# Patient Record
Sex: Male | Born: 1950 | Race: White | Hispanic: No | Marital: Married | Smoking: Never smoker
Health system: Southern US, Community
[De-identification: ages and names within clinical notes are randomized; demographics above are authoritative.]

## PROBLEM LIST (undated history)

## (undated) DIAGNOSIS — G4730 Sleep apnea, unspecified: Secondary | ICD-10-CM

## (undated) DIAGNOSIS — N289 Disorder of kidney and ureter, unspecified: Secondary | ICD-10-CM

## (undated) DIAGNOSIS — I10 Essential (primary) hypertension: Secondary | ICD-10-CM

## (undated) DIAGNOSIS — H269 Unspecified cataract: Secondary | ICD-10-CM

## (undated) DIAGNOSIS — M51369 Other intervertebral disc degeneration, lumbar region without mention of lumbar back pain or lower extremity pain: Secondary | ICD-10-CM

## (undated) DIAGNOSIS — I77811 Abdominal aortic ectasia: Secondary | ICD-10-CM

## (undated) DIAGNOSIS — F32A Depression, unspecified: Secondary | ICD-10-CM

## (undated) DIAGNOSIS — F329 Major depressive disorder, single episode, unspecified: Secondary | ICD-10-CM

## (undated) DIAGNOSIS — R55 Syncope and collapse: Secondary | ICD-10-CM

## (undated) DIAGNOSIS — M5136 Other intervertebral disc degeneration, lumbar region: Secondary | ICD-10-CM

## (undated) DIAGNOSIS — T7840XA Allergy, unspecified, initial encounter: Secondary | ICD-10-CM

## (undated) DIAGNOSIS — T8859XA Other complications of anesthesia, initial encounter: Secondary | ICD-10-CM

## (undated) DIAGNOSIS — E785 Hyperlipidemia, unspecified: Secondary | ICD-10-CM

## (undated) DIAGNOSIS — I4510 Unspecified right bundle-branch block: Secondary | ICD-10-CM

## (undated) HISTORY — DX: Major depressive disorder, single episode, unspecified: F32.9

## (undated) HISTORY — DX: Unspecified cataract: H26.9

## (undated) HISTORY — PX: EYE SURGERY: SHX253

## (undated) HISTORY — DX: Allergy, unspecified, initial encounter: T78.40XA

## (undated) HISTORY — DX: Depression, unspecified: F32.A

## (undated) HISTORY — DX: Syncope and collapse: R55

## (undated) HISTORY — DX: Disorder of kidney and ureter, unspecified: N28.9

## (undated) HISTORY — DX: Hyperlipidemia, unspecified: E78.5

## (undated) HISTORY — DX: Unspecified right bundle-branch block: I45.10

## (undated) HISTORY — DX: Abdominal aortic ectasia: I77.811

## (undated) HISTORY — PX: KNEE ARTHROSCOPY W/ MENISCAL REPAIR: SHX1877

## (undated) HISTORY — PX: JOINT REPLACEMENT: SHX530

## (undated) HISTORY — DX: Sleep apnea, unspecified: G47.30

## (undated) HISTORY — DX: Essential (primary) hypertension: I10

## (undated) HISTORY — PX: ROTATOR CUFF REPAIR: SHX139

## (undated) HISTORY — DX: Other intervertebral disc degeneration, lumbar region without mention of lumbar back pain or lower extremity pain: M51.369

## (undated) HISTORY — PX: WISDOM TOOTH EXTRACTION: SHX21

## (undated) HISTORY — DX: Other intervertebral disc degeneration, lumbar region: M51.36

## (undated) HISTORY — PX: CATARACT EXTRACTION, BILATERAL: SHX1313

---

## 2012-04-18 DIAGNOSIS — R519 Headache, unspecified: Secondary | ICD-10-CM | POA: Insufficient documentation

## 2013-08-10 DIAGNOSIS — E785 Hyperlipidemia, unspecified: Secondary | ICD-10-CM | POA: Insufficient documentation

## 2013-10-20 DIAGNOSIS — Z961 Presence of intraocular lens: Secondary | ICD-10-CM | POA: Insufficient documentation

## 2014-06-04 LAB — HM COLONOSCOPY

## 2015-08-17 DIAGNOSIS — I10 Essential (primary) hypertension: Secondary | ICD-10-CM | POA: Insufficient documentation

## 2016-08-20 DIAGNOSIS — R739 Hyperglycemia, unspecified: Secondary | ICD-10-CM | POA: Insufficient documentation

## 2017-05-21 ENCOUNTER — Ambulatory Visit (INDEPENDENT_AMBULATORY_CARE_PROVIDER_SITE_OTHER): Payer: BLUE CROSS/BLUE SHIELD | Admitting: Physician Assistant

## 2017-05-21 ENCOUNTER — Encounter: Payer: Self-pay | Admitting: Physician Assistant

## 2017-05-21 ENCOUNTER — Encounter (INDEPENDENT_AMBULATORY_CARE_PROVIDER_SITE_OTHER): Payer: Self-pay

## 2017-05-21 VITALS — BP 147/88 | HR 61 | Ht 71.0 in | Wt 245.0 lb

## 2017-05-21 DIAGNOSIS — M51379 Other intervertebral disc degeneration, lumbosacral region without mention of lumbar back pain or lower extremity pain: Secondary | ICD-10-CM | POA: Insufficient documentation

## 2017-05-21 DIAGNOSIS — M5137 Other intervertebral disc degeneration, lumbosacral region: Secondary | ICD-10-CM | POA: Diagnosis not present

## 2017-05-21 DIAGNOSIS — F3342 Major depressive disorder, recurrent, in full remission: Secondary | ICD-10-CM | POA: Insufficient documentation

## 2017-05-21 DIAGNOSIS — J309 Allergic rhinitis, unspecified: Secondary | ICD-10-CM | POA: Insufficient documentation

## 2017-05-21 DIAGNOSIS — F329 Major depressive disorder, single episode, unspecified: Secondary | ICD-10-CM | POA: Insufficient documentation

## 2017-05-21 DIAGNOSIS — M1712 Unilateral primary osteoarthritis, left knee: Secondary | ICD-10-CM | POA: Insufficient documentation

## 2017-05-21 DIAGNOSIS — I10 Essential (primary) hypertension: Secondary | ICD-10-CM

## 2017-05-21 DIAGNOSIS — Z6834 Body mass index (BMI) 34.0-34.9, adult: Secondary | ICD-10-CM

## 2017-05-21 DIAGNOSIS — S83209A Unspecified tear of unspecified meniscus, current injury, unspecified knee, initial encounter: Secondary | ICD-10-CM | POA: Insufficient documentation

## 2017-05-21 DIAGNOSIS — Z8249 Family history of ischemic heart disease and other diseases of the circulatory system: Secondary | ICD-10-CM | POA: Insufficient documentation

## 2017-05-21 DIAGNOSIS — R55 Syncope and collapse: Secondary | ICD-10-CM | POA: Insufficient documentation

## 2017-05-21 DIAGNOSIS — E6609 Other obesity due to excess calories: Secondary | ICD-10-CM | POA: Diagnosis not present

## 2017-05-21 DIAGNOSIS — F32A Depression, unspecified: Secondary | ICD-10-CM | POA: Insufficient documentation

## 2017-05-21 DIAGNOSIS — Z7689 Persons encountering health services in other specified circumstances: Secondary | ICD-10-CM

## 2017-05-21 LAB — HEPATITIS C ANTIBODY

## 2017-05-21 MED ORDER — FLUTICASONE PROPIONATE 50 MCG/ACT NA SUSP: 2.0000 | Freq: Every day | NASAL | 6 refills | Status: DC

## 2017-05-21 NOTE — Patient Instructions (Addendum)
Thank you for coming in today!  Schedule your annual physical exam in August   Physical Activity Recommendations for modifying lipids and lowering blood pressure Engage in aerobic physical activity to reduce LDL-cholesterol, non-HDL-cholesterol, and blood pressure  Frequency: 3-4 sessions per week  Intensity: moderate to vigorous  Duration: 40 minutes on average  Physical Activity Recommendations for secondary prevention 1. Aerobic exercise  Frequency: 3-5 sessions per week  Intensity: 50-80% capacity  Duration: 20 - 60 minutes  Examples: walking, treadmill, cycling, rowing, stair climbing, and arm/leg ergometry  2. Resistance exercise  Frequency: 2-3 sessions per week  Intensity: 10-15 repetitions/set to moderate fatigue  Duration: 1-3 sets of 8-10 upper and lower body exercises  Examples: calisthenics, elastic bands, cuff/hand weights, dumbbels, free weights, wall pulleys, and weight machines  Heart-Healthy Lifestyle  Eating a diet rich in vegetables, fruits and whole grains: also includes low-fat dairy products, poultry, fish, legumes, and nuts; limit intake of sweets, sugar-sweetened beverages and red meats  Getting regular exercise  Maintaining a healthy weight  Limit alcohol to 2 drinks per day  Staying on top of your health; for some people, lifestyle changes alone may not be enough to prevent a heart attack or stroke. In these cases, taking a statin at the right dose will most likely be necessary

## 2017-05-21 NOTE — Progress Notes (Signed)
HPI:                                                                Lance Anderson is a 66 y.o. male who presents to [place]: Primary Care Sports Medicine today to establish care  HTN: taking Amlodipine 69m daily. Compliant with medications. Denies vision change, headache, chest pain with exertion, orthopnea, lightheadedness, syncope and edema. Risk factors include:  HLD: taking Pravastatin daily. Last LDL 103 (01/2017). Last ALT, AST 19, 22. Denies myalgias.   Depression: taking Fluoxetine daily without difficulty. Compliant with medication. Denies symptoms of depression/anhedonia. Denies symptoms of mania/hypomania. Denies suicidal thinking. Denies auditory/visual hallucinations.   Chronic LBP/DDD: takes Ibuprofen 8054mprn for flares. MRI Lspine 10/2014 shows mild disc desiccation and bulge and L3-L4, L4-L5. There is DDD and paracentral disc protrusion traversing the right S1 nerve root at L5-S1.   Health Maintenance Health Maintenance  Topic Date Due  . Hepatitis C Screening  0109/08/1951. HIV Screening  01/17/1968  . COLONOSCOPY  01/16/2003  . INFLUENZA VACCINE  07/23/2017  . TETANUS/TDAP  12/30/2022     Health Habits  Diet: fair  Exercise: cardio x 30 3-4 days/week   Past Medical History:  Diagnosis Date  . Allergy   . Cataract   . DDD (degenerative disc disease), lumbar   . Depression   . Hyperlipidemia   . Hypertension   . RBBB   . Vasovagal syncope    Past Surgical History:  Procedure Laterality Date  . CATARACT EXTRACTION, BILATERAL    . KNEE ARTHROSCOPY W/ MENISCAL REPAIR Left   . ROTATOR CUFF REPAIR Right   . WISDOM TOOTH EXTRACTION     Social History  Substance Use Topics  . Smoking status: Passive Smoke Exposure - Never Smoker  . Smokeless tobacco: Never Used  . Alcohol use 2.4 oz/week    4 Cans of beer per week   family history includes Heart attack in his brother and paternal grandfather; Heart disease in his father; Lung  cancer in his father.  ROS: negative except as noted in the HPI  Medications: Current Outpatient Prescriptions  Medication Sig Dispense Refill  . amLODipine (NORVASC) 5 MG tablet 1 tablet daily.    . Marland Kitchenspirin 81 MG EC tablet Take 81 mg by mouth daily. Swallow whole.    . Marland KitchenLUoxetine (PROZAC) 20 MG capsule 1 capsule daily.    . Marland Kitchenbuprofen (ADVIL,MOTRIN) 800 MG tablet Take 800 mg by mouth every 8 (eight) hours as needed.    . pravastatin (PRAVACHOL) 20 MG tablet 1 tablet daily.    . fluticasone (FLONASE) 50 MCG/ACT nasal spray Place 2 sprays into both nostrils daily. 16 g 6   No current facility-administered medications for this visit.    Allergies  Allergen Reactions  . Codeine Nausea Only  . Paroxetine     Makes spacey  . Penicillins Diarrhea       Objective:  BP (!) 147/88   Pulse 61   Ht 5' 11"  (1.803 m)   Wt 245 lb (111.1 kg)   BMI 34.17 kg/m  Gen: well-groomed, cooperative, not ill-appearing, no distress HEENT: normal conjunctiva, TM's clear, oropharynx clear, moist mucus membranes, no thyromegaly or tenderness Pulm: Normal work of breathing, normal phonation, clear to  auscultation bilaterally CV: Normal rate, regular rhythm, s1 and s2 distinct, no murmurs, clicks or rubs, no carotid bruit Neuro: alert and oriented x 3, EOM's intact, PERRLA, normal tone, no tremor MSK: moving all extremities, normal gait and station, no peripheral edema Skin: warm and dry, no rashes or lesions on exposed skin Psych: normal affect, euthymic mood, normal speech and thought content   No results found for this or any previous visit (from the past 72 hour(s)). No results found.  CMP 02/18/2017 Glucose 92 BUN 21 Scr 1.16 GFR 66 Na 139 K 4.5 Cl 103 CO2 23 Ca 8.8 Tot Prot 6.7 Alb 3.8 Globulin 2.9 T Bili 0.5 Alk Phos 75 AST 22 ALT 19  Lipid Panel 02/18/2017 TC 164 TG 67 HDL 48 VLDL 13 LDL 103  PSA 0.4  CBC 02/18/2017 WBC 7.9 RBC 4.61 Hgb 14.5 Hct 41.3 MCV 90 RDW  13.7 PLT 258  HgbA1C 5.3  Hep C Antibody <0.1  Depression screen PHQ 2/9 05/21/2017  Decreased Interest 0  Down, Depressed, Hopeless 0  PHQ - 2 Score 0    Assessment and Plan: 66 y.o. male with   1. Encounter to establish care - reviewed patient's labs from 01/2017 - reviewed immunizations - reviewed PMH and PSH - Colonoscopy UTD 06/04/2014 requesting outside records  2. Essential hypertension with goal blood pressure less than 140/90 - cont Amlodipine daily - limit salt. DASH eating plan - cont regular cardiovascular exercise, 4 sessions per week  3. Allergic rhinitis, unspecified seasonality, unspecified trigger - fluticasone (FLONASE) 50 MCG/ACT nasal spray; Place 2 sprays into both nostrils daily.  Dispense: 16 g; Refill: 6  4. Class 1 obesity due to excess calories with serious comorbidity and body mass index (BMI) of 34.0 to 34.9 in adult - counseled on heart-healthy lifestyle to include limiting alcohol, regular exercise and healthy diet - patient exercises regularly, but has some limitations due to back and knee pain   Patient education and anticipatory guidance given Patient agrees with treatment plan Follow-up in 3 months for CPE or sooner as needed  Darlyne Russian PA-C

## 2017-05-23 ENCOUNTER — Encounter: Payer: Self-pay | Admitting: Physician Assistant

## 2017-08-04 ENCOUNTER — Encounter: Payer: Self-pay | Admitting: Physician Assistant

## 2017-08-04 ENCOUNTER — Ambulatory Visit (INDEPENDENT_AMBULATORY_CARE_PROVIDER_SITE_OTHER): Payer: BLUE CROSS/BLUE SHIELD | Admitting: Physician Assistant

## 2017-08-04 VITALS — BP 130/82 | HR 54 | Resp 14 | Ht 71.0 in | Wt 244.0 lb

## 2017-08-04 DIAGNOSIS — E785 Hyperlipidemia, unspecified: Secondary | ICD-10-CM

## 2017-08-04 DIAGNOSIS — Z0000 Encounter for general adult medical examination without abnormal findings: Secondary | ICD-10-CM | POA: Diagnosis not present

## 2017-08-04 DIAGNOSIS — Z5181 Encounter for therapeutic drug level monitoring: Secondary | ICD-10-CM

## 2017-08-04 DIAGNOSIS — L299 Pruritus, unspecified: Secondary | ICD-10-CM | POA: Diagnosis not present

## 2017-08-04 DIAGNOSIS — I10 Essential (primary) hypertension: Secondary | ICD-10-CM | POA: Diagnosis not present

## 2017-08-04 DIAGNOSIS — Z79899 Other long term (current) drug therapy: Secondary | ICD-10-CM | POA: Diagnosis not present

## 2017-08-04 DIAGNOSIS — F3342 Major depressive disorder, recurrent, in full remission: Secondary | ICD-10-CM | POA: Diagnosis not present

## 2017-08-04 LAB — LIPID PANEL W/REFLEX DIRECT LDL
CHOL/HDL RATIO: 3.7 ratio (ref <5.0)
CHOLESTEROL: 182 mg/dL (ref <200)
HDL: 49 mg/dL (ref >40)
LDL-CHOLESTEROL: 118 mg/dL — AB
NON-HDL CHOLESTEROL (CALC): 133 mg/dL — AB (ref <130)
TRIGLYCERIDES: 56 mg/dL (ref <150)

## 2017-08-04 LAB — COMPREHENSIVE METABOLIC PANEL
ALBUMIN: 3.8 g/dL (ref 3.6–5.1)
ALT: 16 U/L (ref 9–46)
AST: 20 U/L (ref 10–35)
Alkaline Phosphatase: 68 U/L (ref 40–115)
BUN: 22 mg/dL (ref 7–25)
CHLORIDE: 107 mmol/L (ref 98–110)
CO2: 25 mmol/L (ref 20–32)
Calcium: 8.8 mg/dL (ref 8.6–10.3)
Creat: 1.13 mg/dL (ref 0.70–1.25)
Glucose, Bld: 96 mg/dL (ref 65–99)
Potassium: 4.4 mmol/L (ref 3.5–5.3)
SODIUM: 139 mmol/L (ref 135–146)
TOTAL PROTEIN: 6.8 g/dL (ref 6.1–8.1)
Total Bilirubin: 0.5 mg/dL (ref 0.2–1.2)

## 2017-08-04 MED ORDER — PRAVASTATIN SODIUM 20 MG PO TABS: 20.0000 mg | ORAL_TABLET | Freq: Every day | ORAL | 3 refills | Status: DC

## 2017-08-04 MED ORDER — HYDROXYZINE HCL 25 MG PO TABS: 25.0000 mg | ORAL_TABLET | Freq: Three times a day (TID) | ORAL | 3 refills | Status: DC | PRN

## 2017-08-04 MED ORDER — FLUOXETINE HCL 20 MG PO CAPS: 20.0000 mg | ORAL_CAPSULE | Freq: Every day | ORAL | 1 refills | Status: DC

## 2017-08-04 MED ORDER — AMLODIPINE BESYLATE 5 MG PO TABS: 5.0000 mg | ORAL_TABLET | Freq: Every day | ORAL | 1 refills | Status: DC

## 2017-08-04 NOTE — Progress Notes (Signed)
HPI:                                                                Lance Anderson is a 66 y.o. male who presents to [facility]: [facility] today for annual physical exam  Current Concerns include itching  Patient reports pruritus triggered by sun exposure. Reports he is currently itchy across his chest wall. Denies urticaria or rash.   Health Maintenance Health Maintenance  Topic Date Due  . HIV Screening  01/17/1966  . INFLUENZA VACCINE  07/23/2017  . TETANUS/TDAP  12/30/2022  . COLONOSCOPY  06/04/2024  . Hepatitis C Screening  Completed    Past Medical History:  Diagnosis Date  . Allergy   . Cataract   . DDD (degenerative disc disease), lumbar   . Depression   . Hyperlipidemia   . Hypertension   . RBBB   . Vasovagal syncope    Past Surgical History:  Procedure Laterality Date  . CATARACT EXTRACTION, BILATERAL    . KNEE ARTHROSCOPY W/ MENISCAL REPAIR Left   . ROTATOR CUFF REPAIR Right   . WISDOM TOOTH EXTRACTION     Social History  Substance Use Topics  . Smoking status: Passive Smoke Exposure - Never Smoker  . Smokeless tobacco: Never Used  . Alcohol use 2.4 oz/week    4 Cans of beer per week   family history includes Heart attack in his brother and paternal grandfather; Heart disease in his father; Lung cancer in his father.  ROS: Review of Systems  Constitutional: Negative.   HENT: Negative.   Eyes: Positive for blurred vision (wears reading glasses).  Respiratory: Negative.   Cardiovascular: Negative.   Gastrointestinal: Negative.   Genitourinary: Negative.   Musculoskeletal: Negative.   Skin: Positive for itching. Negative for rash.  Neurological: Negative.   Endo/Heme/Allergies: Positive for environmental allergies.  Psychiatric/Behavioral: Negative.      Medications: Current Outpatient Prescriptions  Medication Sig Dispense Refill  . amLODipine (NORVASC) 5 MG tablet 1 tablet daily.    Marland Kitchen aspirin 81 MG EC  tablet Take 81 mg by mouth daily. Swallow whole.    Marland Kitchen FLUoxetine (PROZAC) 20 MG capsule 1 capsule daily.    . fluticasone (FLONASE) 50 MCG/ACT nasal spray Place 2 sprays into both nostrils daily. 16 g 6  . ibuprofen (ADVIL,MOTRIN) 800 MG tablet Take 800 mg by mouth every 8 (eight) hours as needed.    . pravastatin (PRAVACHOL) 20 MG tablet 1 tablet daily.     No current facility-administered medications for this visit.    Allergies  Allergen Reactions  . Codeine Nausea Only  . Paroxetine     Makes spacey  . Penicillins Diarrhea       Objective:  BP 130/82   Pulse (!) 54   Ht 5\' 11"  (1.803 m)   Wt 244 lb (110.7 kg)   BMI 34.03 kg/m  General Appearance:  Alert, cooperative, no distress, appropriate for age                            Head:  Normocephalic, without obvious abnormality  Eyes:  PERRLA, no nystagmus, EOM's intact, conjunctiva and cornea clear,                              Ears:  TM pearly gray color and semitransparent, external ear canals normal, both ears                            Nose:  Nares symmetrical                          Throat:  Lips, tongue, and mucosa are moist, pink, and intact; good dentition                             Neck:  Supple; symmetrical, trachea midline, no adenopathy; thyroid: no enlargement, symmetric, no tenderness/mass/nodules                             Back:  Symmetrical, no curvature, ROM normal               Chest:  Atraumatic, symmetric, no tenderness, no rashes                           Lungs:  Clear to auscultation bilaterally, respirations unlabored                             Heart:  bradycardic rate & normal rhythm, S1 and S2 normal, no murmurs, rubs, or gallops, no carotid bruit                     Abdomen:  Soft, non-tender, no mass or organomegaly              Genitourinary:  deferred         Musculoskeletal:  Tone and strength strong and symmetrical, all extremities; no joint pain or edema, normal  gait and station                                    Lymphatic:  No cervical or supraclavicular adenopathy             Skin/Hair/Nails:  Skin warm, dry and intact, no rashes or abnormal dyspigmentation                   Neurologic:  Alert and oriented x3, no cranial nerve deficits, sensation grossly intact, normal tone, no tremor    No results found for this or any previous visit (from the past 72 hour(s)). No results found.  [facility] 08/04/2017 05/21/2017  Decreased Interest 0 0  Down, Depressed, Hopeless 0 0  PHQ - 2 Score 0 0     Assessment and Plan: 66 y.o. male with   1. Encounter for annual physical exam - negative PHQ2 - reviewed health maintenance - colonscopy UTD - tetanus UTD - hepatitis c screening complete  2. Dyslipidemia - pravastatin (PRAVACHOL) 20 MG tablet; Take 1 tablet (20 mg total) by mouth daily.  Dispense: 90 tablet; Refill: 3  3. Recurrent major depressive disorder, in full remission (HCC) - FLUoxetine (PROZAC) 20 MG capsule; Take 1  capsule (20 mg total) by mouth daily.  Dispense: 90 capsule; Refill: 1  4. Essential hypertension with goal blood pressure less than 140/90 - amLODipine (NORVASC) 5 MG tablet; Take 1 tablet (5 mg total) by mouth daily.  Dispense: 90 tablet; Refill: 1 - Comprehensive metabolic panel  5. Encounter for monitoring statin therapy - Lipid Panel w/reflex Direct LDL  6. Pruritus - generalized without primary skin lesions - Hydroxyzine prn - patient is not taking any medications known to cause photosensitivity or pruritis. He has no rash. No systemic or constitutional symptoms - checking liver function today with CMP - if symptoms do not respond to antihistamine, will proceed with further workup to include CBC w/diff, TSH, HIV, and CXR  No orders of the defined types were placed in this encounter.  Patient education and anticipatory guidance given Patient agrees with treatment plan Follow-up in 6 months for  HTN or sooner as needed  Levonne Hubert PA-C

## 2017-08-04 NOTE — Patient Instructions (Signed)

## 2017-08-05 MED ORDER — PRAVASTATIN SODIUM 40 MG PO TABS: 40.0000 mg | ORAL_TABLET | Freq: Every day | ORAL | 3 refills | Status: DC

## 2017-08-05 NOTE — Progress Notes (Signed)
Hi Lance Anderson,  Your labs look good.  Your LDL cholesterol ideally should be 100 or less for preventing plaques/blockages. I recommend increasing your pravastatin to 40mg  (2 tabs) daily. I can write a new prescription for you. Continue to work on limiting saturated fats and regular cardiovascular exercise.  Best, Vinetta Bergamoharley

## 2017-08-05 NOTE — Addendum Note (Signed)
Addended by: Gena FrayUMMINGS, CHARLEY E on: 08/05/2017 05:25 PM   Modules accepted: Orders

## 2017-10-26 ENCOUNTER — Encounter: Payer: Self-pay | Admitting: Emergency Medicine

## 2017-10-26 ENCOUNTER — Emergency Department (INDEPENDENT_AMBULATORY_CARE_PROVIDER_SITE_OTHER)
Admission: EM | Admit: 2017-10-26 | Discharge: 2017-10-26 | Disposition: A | Discharge status: Home / Self Care | Payer: BLUE CROSS/BLUE SHIELD | Source: Home / Self Care | Attending: Family Medicine | Admitting: Family Medicine

## 2017-10-26 DIAGNOSIS — J069 Acute upper respiratory infection, unspecified: Secondary | ICD-10-CM | POA: Diagnosis not present

## 2017-10-26 DIAGNOSIS — R05 Cough: Secondary | ICD-10-CM

## 2017-10-26 DIAGNOSIS — R053 Chronic cough: Secondary | ICD-10-CM

## 2017-10-26 MED ORDER — BENZONATATE 100 MG PO CAPS: 100.0000 mg | ORAL_CAPSULE | Freq: Three times a day (TID) | ORAL | 0 refills | Status: DC

## 2017-10-26 MED ORDER — AZITHROMYCIN 250 MG PO TABS: 250.0000 mg | ORAL_TABLET | Freq: Every day | ORAL | 0 refills | Status: DC

## 2017-10-26 NOTE — ED Triage Notes (Signed)
[facility]/O cough times 3 weeks, productive over the last week with greening sputum. Denies fever, C/O headache, and sinus pressure, and nasal drainage.

## 2017-10-26 NOTE — ED Provider Notes (Signed)
[name]    CSN: [id number] Arrival date & time: 10/26/17  1302     History   Chief Complaint Chief Complaint  Patient presents with  . Cough  . Headache    HPI Lance Anderson is a 66 y.o. male.   HPI  Lance Anderson is a 66 y.o. male presenting to UC with c/o 3-4 weeks of gradually worsening productive cough with associated nasal congestion, frontal headache and sinus pressure. Over the last 1 week he has developed green sputum.  His wife has been sick but he states she typically gets a sinus infection 3-4 times a year while he has not been sick for several years.  Denies fever, chills, n/v/d. No hx of asthma or COPD.   Past Medical History:  Diagnosis Date  . Allergy   . Cataract   . DDD (degenerative disc disease), lumbar   . Depression   . Hyperlipidemia   . Hypertension   . RBBB   . Vasovagal syncope     Patient Active Problem List   Diagnosis Date Noted  . Pruritus 08/04/2017  . Encounter for monitoring statin therapy 08/04/2017  . Depression 05/21/2017  . Family history of heart disease in male family member before age 86 05/21/2017  . Tear of meniscus of knee 05/21/2017  . Class 1 obesity due to excess calories with serious comorbidity and body mass index (BMI) of 34.0 to 34.9 in adult 05/21/2017  . Allergic rhinitis 05/21/2017  . DDD (degenerative disc disease), lumbosacral 05/21/2017  . Vasovagal episode 05/21/2017  . Essential hypertension with goal blood pressure less than 140/90 08/17/2015  . Pseudophakia of both eyes 10/20/2013  . Dyslipidemia 08/10/2013    Past Surgical History:  Procedure Laterality Date  . CATARACT EXTRACTION, BILATERAL    . KNEE ARTHROSCOPY W/ MENISCAL REPAIR Left   . ROTATOR CUFF REPAIR Right   . WISDOM TOOTH EXTRACTION         Home Medications    Prior to Admission medications   Medication Sig Start Date End Date Taking? Authorizing Provider  amLODipine (NORVASC) 5 MG tablet Take 1 tablet (5 mg total)  by mouth daily. 08/04/17   Carlis Stable, PA-C  aspirin 81 MG EC tablet Take 81 mg by mouth daily. Swallow whole.    [provider]  azithromycin (ZITHROMAX) 250 MG tablet Take 1 tablet (250 mg total) daily by mouth. Take first 2 tablets together, then 1 every day until finished. 10/26/17   Lurene Shadow, PA-C  benzonatate (TESSALON) 100 MG capsule Take 1-2 capsules (100-200 mg total) every 8 (eight) hours by mouth. 10/26/17   Lurene Shadow, PA-C  FLUoxetine (PROZAC) 20 MG capsule Take 1 capsule (20 mg total) by mouth daily. 08/04/17   Carlis Stable, PA-C  fluticasone [facility]) 50 MCG/ACT nasal spray Place 2 sprays into both nostrils daily. 05/21/17   Carlis Stable, PA-C  hydrOXYzine (ATARAX/VISTARIL) 25 MG tablet Take 1 tablet (25 mg total) by mouth 3 (three) times daily as needed. 08/04/17   Carlis Stable, PA-C  ibuprofen (ADVIL,MOTRIN) 800 MG tablet Take 800 mg by mouth every 8 (eight) hours as needed.    [provider]  pravastatin (PRAVACHOL) 40 MG tablet Take 1 tablet (40 mg total) by mouth daily. 08/05/17   Carlis Stable, PA-C    Family History Family History  Problem Relation Age of Onset  . Heart disease Father   . Lung cancer Father   . Heart attack  Brother   . Heart attack Paternal Grandfather     Social History Social History   Tobacco Use  . Smoking status: Passive Smoke Exposure - Never Smoker  . Smokeless tobacco: Never Used  Substance Use Topics  . Alcohol use: Yes    Alcohol/week: 2.4 oz    Types: 4 Cans of beer per week  . Drug use: No     Allergies   Codeine; Paroxetine; and Penicillins   Review of Systems Review of Systems  Constitutional: Negative for chills and fever.  HENT: Positive for congestion, postnasal drip, rhinorrhea, sinus pressure and sinus pain. Negative for ear pain, sore throat, trouble swallowing and voice change.   Respiratory: Positive for cough.  Negative for shortness of breath.   Cardiovascular: Negative for chest pain and palpitations.  Gastrointestinal: Negative for abdominal pain, diarrhea, nausea and vomiting.  Musculoskeletal: Negative for arthralgias, back pain and myalgias.  Skin: Negative for rash.     Physical Exam Triage Vital Signs ED Triage Vitals  Enc Vitals Group     BP 10/26/17 1332 (!) 152/83     Pulse Rate 10/26/17 1332 81     Resp 10/26/17 1332 18     Temp 10/26/17 1332 98.6 F (37 C)     Temp Source 10/26/17 1332 Oral     SpO2 10/26/17 1332 98 %     Weight 10/26/17 1334 240 lb (108.9 kg)     Height 10/26/17 1334 5\' 11"  (1.803 m)     Head Circumference --      Peak Flow --      Pain Score 10/26/17 1335 4     Pain Loc --      Pain Edu? --      Excl. in GC? --    No data found.  Updated Vital Signs BP (!) 152/83 (BP Location: Left Arm)   Pulse 81   Temp 98.6 F (37 C) (Oral)   Resp 18   Ht 5\' 11"  (1.803 m)   Wt 240 lb (108.9 kg)   SpO2 98%   BMI 33.47 kg/m   Visual Acuity Right Eye Distance:   Left Eye Distance:   Bilateral Distance:    Right Eye Near:   Left Eye Near:    Bilateral Near:     Physical Exam  Constitutional: He is oriented to person, place, and time. He appears well-developed and well-nourished.  Non-toxic appearance. He does not appear ill. No distress.  HENT:  Head: Normocephalic and atraumatic.  Right Ear: Tympanic membrane normal.  Left Ear: Tympanic membrane normal.  Nose: Mucosal edema present. Right sinus exhibits no maxillary sinus tenderness and no frontal sinus tenderness. Left sinus exhibits no maxillary sinus tenderness and no frontal sinus tenderness.  Mouth/Throat: Uvula is midline, oropharynx is clear and moist and mucous membranes are normal.  Eyes: EOM are normal.  Neck: Normal range of motion.  Cardiovascular: Normal rate and regular rhythm.  Pulmonary/Chest: Effort normal. No respiratory distress. He has wheezes (faint wheeze in Left upper lung  field, cleared with cough).  Musculoskeletal: Normal range of motion.  Neurological: He is alert and oriented to person, place, and time.  Skin: Skin is warm and dry.  Psychiatric: He has a normal mood and affect. His behavior is normal.  Nursing note and vitals reviewed.    UC Treatments / Results  Labs (all labs ordered are listed, but only abnormal results are displayed) Labs Reviewed - No data to display  EKG  EKG Interpretation  None       Radiology No results found.  Procedures Procedures (including critical care time)  Medications Ordered in UC Medications - No data to display   Initial Impression / Assessment and Plan / UC Course  I have reviewed the triage vital signs and the nursing notes.  Pertinent labs & imaging results that were available during my care of the patient were reviewed by me and considered in my medical decision making (see chart for details).     Hx and exam c/w URI and sinusitis. Due to persistent cough, will cover for bacterial cause Pt allergic to PCN, will start on Azithromycin Encouraged fluids and rest F/u with PCP in 1 week if not improving.   Final Clinical Impressions(s) / UC Diagnoses   Final diagnoses:  Upper respiratory tract infection, unspecified type  Persistent cough for 3 weeks or longer    New Prescriptions This SmartLink is deprecated. Use AVSMEDLIST instead to display the medication list for a patient.   Controlled Substance Prescriptions [place] Controlled Substance Registry consulted? Not Applicable   Rolla Platehelps, Tonishia Steffy O, PA-C 10/26/17 1421

## 2017-12-31 ENCOUNTER — Other Ambulatory Visit: Payer: Self-pay

## 2017-12-31 ENCOUNTER — Emergency Department (INDEPENDENT_AMBULATORY_CARE_PROVIDER_SITE_OTHER): Payer: Medicare Other

## 2017-12-31 ENCOUNTER — Encounter: Payer: Self-pay | Admitting: *Deleted

## 2017-12-31 ENCOUNTER — Emergency Department (INDEPENDENT_AMBULATORY_CARE_PROVIDER_SITE_OTHER)
Admission: EM | Admit: 2017-12-31 | Discharge: 2017-12-31 | Disposition: A | Discharge status: Home / Self Care | Payer: Medicare Other | Source: Home / Self Care | Attending: Family Medicine | Admitting: Family Medicine

## 2017-12-31 DIAGNOSIS — M25512 Pain in left shoulder: Secondary | ICD-10-CM

## 2017-12-31 DIAGNOSIS — M19012 Primary osteoarthritis, left shoulder: Secondary | ICD-10-CM | POA: Diagnosis not present

## 2017-12-31 DIAGNOSIS — S4992XA Unspecified injury of left shoulder and upper arm, initial encounter: Secondary | ICD-10-CM | POA: Diagnosis not present

## 2017-12-31 NOTE — ED Triage Notes (Signed)
Pt c/o LT shoulder pain and arm weakness post fall on ice on 12/01/17.

## 2017-12-31 NOTE — ED Provider Notes (Signed)
[name]    CSN: [id number] Arrival date & time: 12/31/17  1103     History   Chief Complaint Chief Complaint  Patient presents with  . Shoulder Pain    HPI Lance Anderson is a 67 y.o. male.   HPI  Lance Anderson is a 67 y.o. male presenting to UC with c/o gradually worsening Left shoulder soreness and mild weakness since falling on an ice patch on 12/01/17. At the time, he only had Right knee pain as he landed on the right side of his body.  As knee pain started to subside, he developed Left shoulder soreness. No other injuries or heavy lifting that he can recall.  He did have surgery on his Right shoulder in 1990s and has also been advised his shoulder has a mild deformity due to years of overhead lifting and reaching with work.  Denies numbness in Left arm.  He is Right hand dominant.    Past Medical History:  Diagnosis Date  . Allergy   . Cataract   . DDD (degenerative disc disease), lumbar   . Depression   . Hyperlipidemia   . Hypertension   . RBBB   . Vasovagal syncope     Patient Active Problem List   Diagnosis Date Noted  . Pruritus 08/04/2017  . Encounter for monitoring statin therapy 08/04/2017  . Depression 05/21/2017  . Family history of heart disease in male family member before age 72 05/21/2017  . Tear of meniscus of knee 05/21/2017  . Class 1 obesity due to excess calories with serious comorbidity and body mass index (BMI) of 34.0 to 34.9 in adult 05/21/2017  . Allergic rhinitis 05/21/2017  . DDD (degenerative disc disease), lumbosacral 05/21/2017  . Vasovagal episode 05/21/2017  . Essential hypertension with goal blood pressure less than 140/90 08/17/2015  . Pseudophakia of both eyes 10/20/2013  . Dyslipidemia 08/10/2013    Past Surgical History:  Procedure Laterality Date  . CATARACT EXTRACTION, BILATERAL    . KNEE ARTHROSCOPY W/ MENISCAL REPAIR Left   . ROTATOR CUFF REPAIR Right   . WISDOM TOOTH EXTRACTION         Home  Medications    Prior to Admission medications   Medication Sig Start Date End Date Taking? Authorizing Provider  aspirin 81 MG EC tablet Take 81 mg by mouth daily. Swallow whole.   Yes [provider]  FLUoxetine (PROZAC) 20 MG capsule Take 1 capsule (20 mg total) by mouth daily. 08/04/17  Yes Carlis Stable, PA-C  amLODipine (NORVASC) 5 MG tablet Take 1 tablet (5 mg total) by mouth daily. 08/04/17   Carlis Stable, PA-C  fluticasone [facility]) 50 MCG/ACT nasal spray Place 2 sprays into both nostrils daily. 05/21/17   Carlis Stable, PA-C  hydrOXYzine (ATARAX/VISTARIL) 25 MG tablet Take 1 tablet (25 mg total) by mouth 3 (three) times daily as needed. 08/04/17   Carlis Stable, PA-C  ibuprofen (ADVIL,MOTRIN) 800 MG tablet Take 800 mg by mouth every 8 (eight) hours as needed.    [provider]  pravastatin (PRAVACHOL) 40 MG tablet Take 1 tablet (40 mg total) by mouth daily. 08/05/17   Carlis Stable, PA-C    Family History Family History  Problem Relation Age of Onset  . Heart disease Father   . Lung cancer Father   . Heart attack Brother   . Heart attack Paternal Grandfather     Social History Social History   Tobacco Use  . Smoking  status: Passive Smoke Exposure - Never Smoker  . Smokeless tobacco: Never Used  Substance Use Topics  . Alcohol use: Yes    Alcohol/week: 2.4 oz    Types: 4 Cans of beer per week  . Drug use: No     Allergies   Codeine; Paroxetine; and Penicillins   Review of Systems Review of Systems  Musculoskeletal: Positive for arthralgias and myalgias. Negative for joint swelling.  Skin: Negative for color change and rash.  Neurological: Positive for weakness. Negative for numbness.     Physical Exam Triage Vital Signs ED Triage Vitals  Enc Vitals Group     BP 12/31/17 1123 126/76     Pulse Rate 12/31/17 1123 63     Resp 12/31/17 1123 16     Temp 12/31/17 1123 98.1 F  (36.7 C)     Temp Source 12/31/17 1123 Oral     SpO2 12/31/17 1123 97 %     Weight 12/31/17 1123 243 lb (110.2 kg)     Height 12/31/17 1123 5\' 11"  (1.803 m)     Head Circumference --      Peak Flow --      Pain Score 12/31/17 1125 2     Pain Loc --      Pain Edu? --      Excl. in GC? --    No data found.  Updated Vital Signs BP 126/76 (BP Location: Right Arm)   Pulse 63   Temp 98.1 F (36.7 C) (Oral)   Resp 16   Ht 5\' 11"  (1.803 m)   Wt 243 lb (110.2 kg)   SpO2 97%   BMI 33.89 kg/m   Visual Acuity Right Eye Distance:   Left Eye Distance:   Bilateral Distance:    Right Eye Near:   Left Eye Near:    Bilateral Near:     Physical Exam  Constitutional: He is oriented to person, place, and time. He appears well-developed and well-nourished. No distress.  HENT:  Head: Normocephalic and atraumatic.  Mouth/Throat: Oropharynx is clear and moist.  Eyes: EOM are normal.  Neck: Normal range of motion.  Cardiovascular: Normal rate.  Pulses:      Radial pulses are 2+ on the right side, and 2+ on the left side.  Pulmonary/Chest: Effort normal.  Musculoskeletal: Normal range of motion. He exhibits tenderness. He exhibits no edema.  Left shoulder: no obvious deformity. Full ROM. Mild tenderness of proximal deltoid.  Full ROM elbow. 5/5 grip strength.   Neurological: He is alert and oriented to person, place, and time.  Skin: Skin is warm and dry. Capillary refill takes less than 2 seconds. No rash noted. He is not diaphoretic. No erythema.  Psychiatric: He has a normal mood and affect. His behavior is normal.  Nursing note and vitals reviewed.    UC Treatments / Results  Labs (all labs ordered are listed, but only abnormal results are displayed) Labs Reviewed - No data to display  EKG  EKG Interpretation None       Radiology Dg Shoulder Left  Result Date: 12/31/2017 CLINICAL DATA:  Left shoulder pain.  No injury. EXAM: LEFT SHOULDER - 2+ VIEW COMPARISON:  None.  FINDINGS: Mild degenerative changes in the left [facility] and glenohumeral joints with joint space narrowing and spurring. No acute bony abnormality. Specifically, no fracture, subluxation, or dislocation. IMPRESSION: Mild degenerative changes.  No acute bony abnormality. Electronically Signed   By: Charlett NoseKevin  Dover M.D.   On: 12/31/2017 11:39  Procedures Procedures (including critical care time)  Medications Ordered in UC Medications - No data to display   Initial Impression / Assessment and Plan / UC Course  I have reviewed the triage vital signs and the nursing notes.  Pertinent labs & imaging results that were available during my care of the patient were reviewed by me and considered in my medical decision making (see chart for details).     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    Final Clinical Impressions(s) / UC Diagnoses   Final diagnoses:  Left shoulder pain  Injury of left shoulder and upper arm, initial encounter    ED Discharge Orders    None       Controlled Substance Prescriptions [place] Controlled Substance Registry consulted? Not Applicable   Rolla Plate 12/31/17 1314

## 2018-01-02 ENCOUNTER — Ambulatory Visit (INDEPENDENT_AMBULATORY_CARE_PROVIDER_SITE_OTHER): Payer: Medicare Other | Admitting: Family Medicine

## 2018-01-02 ENCOUNTER — Encounter: Payer: Self-pay | Admitting: Family Medicine

## 2018-01-02 VITALS — BP 145/80 | HR 68 | Ht 71.0 in | Wt 246.0 lb

## 2018-01-02 DIAGNOSIS — M25512 Pain in left shoulder: Secondary | ICD-10-CM | POA: Diagnosis not present

## 2018-01-02 NOTE — Patient Instructions (Signed)
Thank you for coming in today. Do the exercises we discussed.  Recheck if not better ina few weeks.  We can start PT with a phone call.    Shoulder Impingement Syndrome Rehab Ask your health care provider which exercises are safe for you. Do exercises exactly as told by your health care provider and adjust them as directed. It is normal to feel mild stretching, pulling, tightness, or discomfort as you do these exercises, but you should stop right away if you feel sudden pain or your pain gets worse.Do not begin these exercises until told by your health care provider. Stretching and range of motion exercise This exercise warms up your muscles and joints and improves the movement and flexibility of your shoulder. This exercise also helps to relieve pain and stiffness. Exercise A: Passive horizontal adduction  1. Sit or stand and pull your left / right elbow across your chest, toward your other shoulder. Stop when you feel a gentle stretch in the back of your shoulder and upper arm. ? Keep your arm at shoulder height. ? Keep your arm as close to your body as you comfortably can. 2. Hold for __________ seconds. 3. Slowly return to the starting position. Repeat __________ times. Complete this exercise __________ times a day. Strengthening exercises These exercises build strength and endurance in your shoulder. Endurance is the ability to use your muscles for a long time, even after they get tired. Exercise B: External rotation, isometric 1. Stand or sit in a doorway, facing the door frame. 2. Bend your left / right elbow and place the back of your wrist against the door frame. Only your wrist should be touching the frame. Keep your upper arm at your side. 3. Gently press your wrist against the door frame, as if you are trying to push your arm away from your abdomen. ? Avoid shrugging your shoulder while you press your hand against the door frame. Keep your shoulder blade tucked down toward the  middle of your back. 4. Hold for __________ seconds. 5. Slowly release the tension, and relax your muscles completely before you do the exercise again. Repeat __________ times. Complete this exercise __________ times a day. Exercise C: Internal rotation, isometric  1. Stand or sit in a doorway, facing the door frame. 2. Bend your left / right elbow and place the inside of your wrist against the door frame. Only your wrist should be touching the frame. Keep your upper arm at your side. 3. Gently press your wrist against the door frame, as if you are trying to push your arm toward your abdomen. ? Avoid shrugging your shoulder while you press your hand against the door frame. Keep your shoulder blade tucked down toward the middle of your back. 4. Hold for __________ seconds. 5. Slowly release the tension, and relax your muscles completely before you do the exercise again. Repeat __________ times. Complete this exercise __________ times a day. Exercise D: Scapular protraction, supine  1. Lie on your back on a firm surface. Hold a __________ weight in your left / right hand. 2. Raise your left / right arm straight into the air so your hand is directly above your shoulder joint. 3. Push the weight into the air so your shoulder lifts off of the surface that you are lying on. Do not move your head, neck, or back. 4. Hold for __________ seconds. 5. Slowly return to the starting position. Let your muscles relax completely before you repeat this exercise. Repeat __________  times. Complete this exercise __________ times a day. Exercise E: Scapular retraction  1. Sit in a stable chair without armrests, or stand. 2. Secure an exercise band to a stable object in front of you so the band is at shoulder height. 3. Hold one end of the exercise band in each hand. Your palms should face down. 4. Squeeze your shoulder blades together and move your elbows slightly behind you. Do not shrug your shoulders while  you do this. 5. Hold for __________ seconds. 6. Slowly return to the starting position. Repeat __________ times. Complete this exercise __________ times a day. Exercise F: Shoulder extension  1. Sit in a stable chair without armrests, or stand. 2. Secure an exercise band to a stable object in front of you where the band is above shoulder height. 3. Hold one end of the exercise band in each hand. 4. Straighten your elbows and lift your hands up to shoulder height. 5. Squeeze your shoulder blades together and pull your hands down to the sides of your thighs. Stop when your hands are straight down by your sides. Do not let your hands go behind your body. 6. Hold for __________ seconds. 7. Slowly return to the starting position. Repeat __________ times. Complete this exercise __________ times a day. This information is not intended to replace advice given to you by your health care provider. Make sure you discuss any questions you have with your health care provider. Document Released: 12/09/2005 Document Revised: 08/15/2016 Document Reviewed: 11/11/2015 Elsevier Interactive Patient Education  [employer].

## 2018-01-02 NOTE — Progress Notes (Signed)
Lance BerkshireJohnny Lance Anderson is a 67 y.o. male who [facility] for left shoulder pain.  Mr. Lance Anderson has had left shoulder pain now for about a month.  This occurred when he fell on his right side.  He does not remember striking his shoulder and notes that the pain did not start until several hours after his fall.  He notes pain in the lateral upper arm worse with overhead motion and reaching back.  He denies any radiating pain weakness or numbness.  The pain is worse with activity and better with rest.  He is tried over-the-counter medications which have not helped.  He had an x-ray recently that showed only minimal degenerative changes of the glenohumeral joint.  He notes the pain is somewhat consistent with previous history of rotator cuff tendinitis.   Past Medical History:  Diagnosis Date  . Allergy   . Cataract   . DDD (degenerative disc disease), lumbar   . Depression   . Hyperlipidemia   . Hypertension   . RBBB   . Vasovagal syncope    Past Surgical History:  Procedure Laterality Date  . CATARACT EXTRACTION, BILATERAL    . KNEE ARTHROSCOPY W/ MENISCAL REPAIR Left   . ROTATOR CUFF REPAIR Right   . WISDOM TOOTH EXTRACTION     Social History   Tobacco Use  . Smoking status: Passive Smoke Exposure - Never Smoker  . Smokeless tobacco: Never Used  Substance Use Topics  . Alcohol use: Yes    Alcohol/week: 2.4 oz    Types: 4 Cans of beer per week     ROS:  As above   Medications: Current Outpatient Medications  Medication Sig Dispense Refill  . amLODipine (NORVASC) 5 MG tablet Take 1 tablet (5 mg total) by mouth daily. 90 tablet 1  . aspirin 81 MG EC tablet Take 81 mg by mouth daily. Swallow whole.    Marland Kitchen. FLUoxetine (PROZAC) 20 MG capsule Take 1 capsule (20 mg total) by mouth daily. 90 capsule 1  . fluticasone (FLONASE) 50 MCG/ACT nasal spray Place 2 sprays into both nostrils daily. 16 g 6  . hydrOXYzine (ATARAX/VISTARIL) 25  MG tablet Take 1 tablet (25 mg total) by mouth 3 (three) times daily as needed. 30 tablet 3  . ibuprofen (ADVIL,MOTRIN) 800 MG tablet Take 800 mg by mouth every 8 (eight) hours as needed.    . pravastatin (PRAVACHOL) 40 MG tablet Take 1 tablet (40 mg total) by mouth daily. 90 tablet 3   No current facility-administered medications for this visit.    Allergies  Allergen Reactions  . Codeine Nausea Only  . Paroxetine     Makes spacey  . Penicillins Diarrhea     Exam:  BP (!) 145/80   Pulse 68   Ht 5\' 11"  (1.803 m)   Wt 246 lb (111.6 kg)   BMI 34.31 kg/m  General: Well Developed, well nourished, and in no acute distress.  Neuro/Psych: Alert and oriented x3, extra-ocular muscles intact, able to move all 4 extremities, sensation grossly intact. Skin: Warm and dry, no rashes noted.  Respiratory: Not using accessory muscles, speaking in full sentences, trachea midline.  Cardiovascular: Pulses palpable, no extremity edema. Abdomen: Does not appear distended. MSK:  Shoulder: Normal-appearing Nontender. Range of motion is full but painful with abduction and internal rotation. Positive Hawkins and Neer's test. Negative empty can test. Negative Yergason's and speeds test. Strength is intact but painful with resisted external rotation and  abduction. Pulses capillary refill and sensation are intact throughout.  Procedure: Real-time Ultrasound Guided Injection of left subacromial bursa Device: GE Logiq E  Images permanently stored and available for review in the ultrasound unit. Verbal informed consent obtained. Discussed risks and benefits of procedure. Warned about infection bleeding damage to structures skin hypopigmentation and fat atrophy among others. Patient expresses understanding and agreement Time-out conducted.  Noted no overlying erythema, induration, or other signs of local infection.  Skin prepped in a sterile fashion.  Local anesthesia: Topical Ethyl chloride.   With sterile technique and under real time ultrasound guidance:40 mg of Kenalog and 2 mL of Marcaine injected easily.  Completed without difficulty  Pain immediately resolved suggesting accurate placement of the medication.  Advised to call if fevers/chills, erythema, induration, drainage, or persistent bleeding.  Images permanently stored and available for review in the ultrasound unit.  Impression: Technically successful ultrasound guided injection.      No results found for this or any previous visit (from the past 48 hour(s)). Dg Shoulder Left  Result Date: 12/31/2017 CLINICAL DATA:  Left shoulder pain.  No injury. EXAM: LEFT SHOULDER - 2+ VIEW COMPARISON:  None. FINDINGS: Mild degenerative changes in the left [facility] and glenohumeral joints with joint space narrowing and spurring. No acute bony abnormality. Specifically, no fracture, subluxation, or dislocation. IMPRESSION: Mild degenerative changes.  No acute bony abnormality. Electronically Signed   By: Charlett Nose M.D.   On: 12/31/2017 11:39      Assessment and Plan: 67 y.o. male with left shoulder pain very likely rotator cuff tendinitis versus subacromial bursitis/impingement.  His exam is positive for impingement signs and he had significant improvement following diagnostic and therapeutic injection today.  Plan for trial of home physical therapy as well as injection as noted above.  If not improving will refer to formal physical therapy and recheck in the near future if not better.    No orders of the defined types were placed in this encounter.  No orders of the defined types were placed in this encounter.   Discussed warning signs or symptoms. Please see discharge instructions. Patient expresses understanding.

## 2018-02-02 ENCOUNTER — Other Ambulatory Visit: Payer: Self-pay | Admitting: Physician Assistant

## 2018-02-02 DIAGNOSIS — F3342 Major depressive disorder, recurrent, in full remission: Secondary | ICD-10-CM

## 2018-02-02 DIAGNOSIS — I10 Essential (primary) hypertension: Secondary | ICD-10-CM

## 2018-02-04 ENCOUNTER — Ambulatory Visit: Payer: BLUE CROSS/BLUE SHIELD | Admitting: Physician Assistant

## 2018-02-25 ENCOUNTER — Ambulatory Visit (INDEPENDENT_AMBULATORY_CARE_PROVIDER_SITE_OTHER): Payer: Medicare Other | Admitting: Physician Assistant

## 2018-02-25 ENCOUNTER — Encounter: Payer: Self-pay | Admitting: Physician Assistant

## 2018-02-25 VITALS — BP 135/81 | HR 58 | Wt 243.0 lb

## 2018-02-25 DIAGNOSIS — Z79899 Other long term (current) drug therapy: Secondary | ICD-10-CM

## 2018-02-25 DIAGNOSIS — Z6834 Body mass index (BMI) 34.0-34.9, adult: Secondary | ICD-10-CM | POA: Diagnosis not present

## 2018-02-25 DIAGNOSIS — N529 Male erectile dysfunction, unspecified: Secondary | ICD-10-CM | POA: Diagnosis not present

## 2018-02-25 DIAGNOSIS — I10 Essential (primary) hypertension: Secondary | ICD-10-CM

## 2018-02-25 DIAGNOSIS — Z2821 Immunization not carried out because of patient refusal: Secondary | ICD-10-CM | POA: Diagnosis not present

## 2018-02-25 DIAGNOSIS — Z5181 Encounter for therapeutic drug level monitoring: Secondary | ICD-10-CM

## 2018-02-25 DIAGNOSIS — Z136 Encounter for screening for cardiovascular disorders: Secondary | ICD-10-CM

## 2018-02-25 DIAGNOSIS — E6609 Other obesity due to excess calories: Secondary | ICD-10-CM | POA: Diagnosis not present

## 2018-02-25 DIAGNOSIS — Z130 Encounter for screening for diseases of the blood and blood-forming organs and certain disorders involving the immune mechanism: Secondary | ICD-10-CM

## 2018-02-25 DIAGNOSIS — F3342 Major depressive disorder, recurrent, in full remission: Secondary | ICD-10-CM | POA: Diagnosis not present

## 2018-02-25 DIAGNOSIS — Z1389 Encounter for screening for other disorder: Secondary | ICD-10-CM | POA: Diagnosis not present

## 2018-02-25 DIAGNOSIS — E785 Hyperlipidemia, unspecified: Secondary | ICD-10-CM

## 2018-02-25 DIAGNOSIS — E66811 Obesity, class 1: Secondary | ICD-10-CM

## 2018-02-25 MED ORDER — AMLODIPINE BESYLATE 5 MG PO TABS: 5.0000 mg | ORAL_TABLET | Freq: Every day | ORAL | 1 refills | Status: DC

## 2018-02-25 MED ORDER — PRAVASTATIN SODIUM 40 MG PO TABS: 40.0000 mg | ORAL_TABLET | Freq: Every day | ORAL | 3 refills | Status: DC

## 2018-02-25 MED ORDER — ASPIRIN 81 MG PO TBEC: 81.0000 mg | DELAYED_RELEASE_TABLET | Freq: Every day | ORAL | 11 refills | Status: AC

## 2018-02-25 NOTE — Patient Instructions (Addendum)
For your blood pressure: - Goal <140/80 - Continue your Amlodipine daily - baby aspirin 81 mg daily to help prevent heart attack/stroke - monitor and log blood pressures at home - check around the same time each day in a relaxed setting - Limit salt to <2000 mg/day - Follow DASH eating plan - limit alcohol to 2 standard drinks per day for men and 1 per day for women - avoid tobacco products - weight loss: 7% of current body weight - follow-up every 6 months for your blood pressure    ED Treatment Vacuum erection devices (VED) have been commercially available for over 40 years. They consist of a closed-end plastic cylinder and a vacuum pump that is either hand or battery-operated. They are generally reliable, non-invasive, relatively easy to use, and inexpensive. They do require some degree of manual dexterity. The disadvantages are that they are not indiscreet being rather bulky, lead to a hinge-like erection (due to only getting the visible part of the penis erect), and can be painful in many patients.  Intracavernosal injections have also been available for 30 years. This treatment involves using a very small insulin-type needle to inject a vasoactive substance directly into the penile erectile tissues. This is a highly successful treatment (>70%) and is mainly used as the next option when patients fail oral medications.  The first injection is done by the provider where the patient or his partner is taught to do subsequent injections on their own. Complications include pain, fibrosis, and prolonged erections. Side effects are uncommon and this is a very successful option once we get the patient past the fear of penile injection, which in reality are no more painful than an injection in any other part of the body.

## 2018-02-25 NOTE — Progress Notes (Signed)
HPI:                                                                Lance Anderson is a 67 y.o. male who presents to [facility]: Primary Care Sports Medicine today for medication management  Depression/Anxiety: taking Fluoxetine for years without difficulty. He has some ED symptoms, but states he has had these symptoms most of his life, including the time before he was on an SSRI. He recently returned from spending the winter in [place] with his wife. Denies depressed mood or anhedonia. Denies symptoms of mania/hypomania. Denies suicidal thinking. Denies auditory/visual hallucinations.  HTN: taking Amlodipine 5 mg daily. Compliant with medications. Does not check BP's at home. Denies vision change, headache, chest pain with exertion, orthopnea, lightheadedness, syncope and edema. Risk factors include: HLD, male sex, age>55    Depression screen [facility] 2/9 02/25/2018 08/04/2017 05/21/2017  Decreased Interest 0 0 0  Down, Depressed, Hopeless 0 0 0  PHQ - 2 Score 0 0 0  Altered sleeping 1 - -  Tired, decreased energy 0 - -  Change in appetite 1 - -  Feeling bad or failure about yourself  0 - -  Trouble concentrating 0 - -  Moving slowly or fidgety/restless 0 - -  Suicidal thoughts 0 - -  PHQ-9 Score 2 - -    No flowsheet data found.    Past Medical History:  Diagnosis Date  . Allergy   . Cataract   . DDD (degenerative disc disease), lumbar   . Depression   . Hyperlipidemia   . Hypertension   . RBBB   . Vasovagal syncope    Past Surgical History:  Procedure Laterality Date  . CATARACT EXTRACTION, BILATERAL    . KNEE ARTHROSCOPY W/ MENISCAL REPAIR Left   . ROTATOR CUFF REPAIR Right   . WISDOM TOOTH EXTRACTION     Social History   Tobacco Use  . Smoking status: Passive Smoke Exposure - Never Smoker  . Smokeless tobacco: Never Used  Substance Use Topics  . Alcohol use: Yes    Alcohol/week: 2.4 oz    Types: 4 Cans of beer per week   family history  includes Heart attack in his brother and paternal grandfather; Heart disease in his father; Lung cancer in his father.    ROS: negative except as noted in the HPI  Medications: Current Outpatient Medications  Medication Sig Dispense Refill  . amLODipine (NORVASC) 5 MG tablet TAKE 1 TABLET DAILY 90 tablet 0  . aspirin 81 MG EC tablet Take 81 mg by mouth daily. Swallow whole.    Marland Kitchen FLUoxetine (PROZAC) 20 MG capsule TAKE 1 CAPSULE DAILY 90 capsule 0  . fluticasone (FLONASE) 50 MCG/ACT nasal spray Place 2 sprays into both nostrils daily. 16 g 6  . ibuprofen (ADVIL,MOTRIN) 800 MG tablet Take 800 mg by mouth every 8 (eight) hours as needed.    . pravastatin (PRAVACHOL) 40 MG tablet Take 1 tablet (40 mg total) by mouth daily. 90 tablet 3   No current facility-administered medications for this visit.    Allergies  Allergen Reactions  . Codeine Nausea Only  . Paroxetine     Makes spacey  . Penicillins Diarrhea       Objective:  BP (!) 142/78   Pulse (!) 54   Wt 243 lb (110.2 kg)   BMI 33.89 kg/m  Gen:  alert, not ill-appearing, no distress, appropriate for age, obese male HEENT: head normocephalic without obvious abnormality, conjunctiva and cornea clear, trachea midline Pulm: Normal work of breathing, normal phonation, clear to auscultation bilaterally, no wheezes, rales or rhonchi CV: bradycardic, regular rhythm, s1 and s2 distinct, no murmurs, clicks or rubs  GI: abdomen soft, nontender, no pulsatile masses, no abdominal bruit Neuro: alert and oriented x 3, no tremor MSK: extremities atraumatic, normal gait and station, no peripheral edema Skin: intact, no rashes on exposed skin, no jaundice, no cyanosis Psych: well-groomed, cooperative, good eye contact, euthymic mood, affect mood-congruent, speech is articulate, and thought processes clear and goal-directed    No results found for this or any previous visit (from the past 72 hour(s)). No results found.    Assessment and  Plan: 67 y.o. male with   1. Encounter for long-term (current) use of medications - CBC - Comprehensive metabolic panel - Lipid Panel w/reflex Direct LDL  2. Essential hypertension with goal blood pressure less than 140/90 BP Readings from Last 3 Encounters:  02/25/18 135/81  01/02/18 (!) 145/80  12/31/17 126/76  - BP in range. Continue Amlodipine and baby asa for primary prevention - CBC - Comprehensive metabolic panel - Urinalysis, Routine w reflex microscopic  3. Class 1 obesity due to excess calories with serious comorbidity and body mass index (BMI) of 34.0 to 34.9 in adult Wt Readings from Last 3 Encounters:  02/25/18 243 lb (110.2 kg)  01/02/18 246 lb (111.6 kg)  12/31/17 243 lb (110.2 kg)  - counseled on weight loss through decreased caloric intake and increased aerobic exercise   4. Refused influenza vaccine   5. Refused pneumococcal vaccination   6. Encounter for abdominal aortic aneurysm (AAA) screening - US ABDOMINAL AORTA SCREENING AAA; Future  7. Encounter for monitoring statin therapy - Lipid Panel w/reflex Direct LDL  8. Screening for blood disease - CBC - Comprehensive metabolic panel  9. Screening for blood or protein in urine - Urinalysis, Routine w reflex microscopic  10. Recurrent major depressive disorder, in full remission (HCC) - PHQ9=0 - continue Fluxoetine 20 mg daily  11. Dyslipidemia, goal LDL below 100 - Lipid Panel w/reflex Direct LDL   12. Erectile dysfunction - sildenafil caused headaches. Recommend VAD. If this fails, recommend intracavernosal injection  Patient education and anticipatory guidance given Patient agrees with treatment plan Follow-up in 3 months for welcome to medicare as needed if symptoms worsen or fail to improve  Levonne Hubertharley E. Pratik Dalziel PA-C

## 2018-02-26 LAB — COMPREHENSIVE METABOLIC PANEL
AG RATIO: 1.3 (calc) (ref 1.0–2.5)
ALKALINE PHOSPHATASE (APISO): 62 U/L (ref 40–115)
ALT: 16 U/L (ref 9–46)
AST: 19 U/L (ref 10–35)
Albumin: 3.9 g/dL (ref 3.6–5.1)
BILIRUBIN TOTAL: 0.7 mg/dL (ref 0.2–1.2)
BUN: 20 mg/dL (ref 7–25)
CALCIUM: 8.8 mg/dL (ref 8.6–10.3)
CHLORIDE: 105 mmol/L (ref 98–110)
CO2: 31 mmol/L (ref 20–32)
Creat: 1.07 mg/dL (ref 0.70–1.25)
GLOBULIN: 2.9 g/dL (ref 1.9–3.7)
GLUCOSE: 87 mg/dL (ref 65–99)
Potassium: 4.7 mmol/L (ref 3.5–5.3)
Sodium: 140 mmol/L (ref 135–146)
Total Protein: 6.8 g/dL (ref 6.1–8.1)

## 2018-02-26 LAB — LIPID PANEL W/REFLEX DIRECT LDL
Cholesterol: 204 mg/dL — ABNORMAL HIGH (ref <200)
HDL: 59 mg/dL (ref >40)
LDL Cholesterol (Calc): 131 mg/dL (calc) — ABNORMAL HIGH
NON-HDL CHOLESTEROL (CALC): 145 mg/dL — AB (ref <130)
Total CHOL/HDL Ratio: 3.5 (calc) (ref <5.0)
Triglycerides: 52 mg/dL (ref <150)

## 2018-02-26 LAB — CBC
HEMATOCRIT: 40.3 % (ref 38.5–50.0)
HEMOGLOBIN: 14 g/dL (ref 13.2–17.1)
MCH: 31.7 pg (ref 27.0–33.0)
MCHC: 34.7 g/dL (ref 32.0–36.0)
MCV: 91.2 fL (ref 80.0–100.0)
MPV: 10.7 fL (ref 7.5–12.5)
Platelets: 254 10*3/uL (ref 140–400)
RBC: 4.42 10*6/uL (ref 4.20–5.80)
RDW: 12.8 % (ref 11.0–15.0)
WBC: 6.9 10*3/uL (ref 3.8–10.8)

## 2018-02-26 LAB — URINALYSIS, ROUTINE W REFLEX MICROSCOPIC
Bilirubin Urine: NEGATIVE
Glucose, UA: NEGATIVE
HGB URINE DIPSTICK: NEGATIVE
KETONES UR: NEGATIVE
Leukocytes, UA: NEGATIVE
NITRITE: NEGATIVE
PH: 7 (ref 5.0–8.0)
Protein, ur: NEGATIVE
Specific Gravity, Urine: 1.021 (ref 1.001–1.03)

## 2018-02-26 NOTE — Progress Notes (Signed)
Labs look good I would like to see LDL cholesterol below 100. This can be better accomplished with switching from Pravastatin to Atorvastatin, which is a little more potent. Ask if he is okay with this change

## 2018-02-27 ENCOUNTER — Encounter: Payer: Self-pay | Admitting: Physician Assistant

## 2018-03-03 ENCOUNTER — Encounter: Payer: Self-pay | Admitting: Physician Assistant

## 2018-03-03 DIAGNOSIS — N529 Male erectile dysfunction, unspecified: Secondary | ICD-10-CM | POA: Insufficient documentation

## 2018-03-06 ENCOUNTER — Ambulatory Visit (HOSPITAL_BASED_OUTPATIENT_CLINIC_OR_DEPARTMENT_OTHER): Payer: Medicare Other

## 2018-03-11 ENCOUNTER — Ambulatory Visit (HOSPITAL_BASED_OUTPATIENT_CLINIC_OR_DEPARTMENT_OTHER)
Admission: RE | Admit: 2018-03-11 | Discharge: 2018-03-11 | Disposition: A | Discharge status: Home / Self Care | Payer: Medicare Other | Source: Ambulatory Visit | Attending: Physician Assistant | Admitting: Physician Assistant

## 2018-03-11 ENCOUNTER — Other Ambulatory Visit: Payer: Self-pay | Admitting: Physician Assistant

## 2018-03-11 ENCOUNTER — Encounter (HOSPITAL_BASED_OUTPATIENT_CLINIC_OR_DEPARTMENT_OTHER): Payer: Self-pay

## 2018-03-11 DIAGNOSIS — Z136 Encounter for screening for cardiovascular disorders: Secondary | ICD-10-CM

## 2018-03-11 DIAGNOSIS — I77811 Abdominal aortic ectasia: Secondary | ICD-10-CM | POA: Insufficient documentation

## 2018-03-12 ENCOUNTER — Encounter: Payer: Self-pay | Admitting: Physician Assistant

## 2018-03-12 DIAGNOSIS — I77811 Abdominal aortic ectasia: Secondary | ICD-10-CM

## 2018-03-12 HISTORY — DX: Abdominal aortic ectasia: I77.811

## 2018-03-12 NOTE — Progress Notes (Signed)
Good afternoon Anes,  Your ultrasound does not show an aneurysm, but you have something called aortic ectasia, which increases your risk of developing an aneurysm. Recommend we control your blood pressure well and repeat an ultrasound in 5 years.  Best, Vinetta Bergamoharley

## 2018-03-13 ENCOUNTER — Encounter: Payer: Self-pay | Admitting: Physician Assistant

## 2018-03-13 ENCOUNTER — Ambulatory Visit (INDEPENDENT_AMBULATORY_CARE_PROVIDER_SITE_OTHER): Payer: Medicare Other | Admitting: Physician Assistant

## 2018-03-13 VITALS — BP 131/78 | HR 62 | Wt 243.0 lb

## 2018-03-13 DIAGNOSIS — E785 Hyperlipidemia, unspecified: Secondary | ICD-10-CM

## 2018-03-13 DIAGNOSIS — M5137 Other intervertebral disc degeneration, lumbosacral region: Secondary | ICD-10-CM

## 2018-03-13 DIAGNOSIS — I77811 Abdominal aortic ectasia: Secondary | ICD-10-CM

## 2018-03-13 MED ORDER — ATORVASTATIN CALCIUM 40 MG PO TABS: 40.0000 mg | ORAL_TABLET | Freq: Every day | ORAL | 3 refills | Status: DC

## 2018-03-13 MED ORDER — NAPROXEN 500 MG PO TABS: 500.0000 mg | ORAL_TABLET | Freq: Two times a day (BID) | ORAL | 2 refills | Status: DC

## 2018-03-13 NOTE — Progress Notes (Signed)
HPI:                                                                Lance Anderson is a 67 y.o. male who presents to [facility]: Primary Care Sports Medicine today for abdominal aortic ectasia  Patient recently underwent AAA screening and was found to have aortic ectasia (AP 2.9 proximally). Presents today with his wife to discuss the test result, pathophysiology and treatment.  He is also requesting refill of Ibuprofen for osteoarthritis and arthralgias.   No flowsheet data found.    Past Medical History:  Diagnosis Date  . Abdominal aortic ectasia (HCC) 03/12/2018  . Allergy   . Cataract   . DDD (degenerative disc disease), lumbar   . Depression   . Hyperlipidemia   . Hypertension   . RBBB   . Vasovagal syncope    Past Surgical History:  Procedure Laterality Date  . CATARACT EXTRACTION, BILATERAL    . KNEE ARTHROSCOPY W/ MENISCAL REPAIR Left   . ROTATOR CUFF REPAIR Right   . WISDOM TOOTH EXTRACTION     Social History   Tobacco Use  . Smoking status: Passive Smoke Exposure - Never Smoker  . Smokeless tobacco: Never Used  Substance Use Topics  . Alcohol use: Yes    Alcohol/week: 2.4 oz    Types: 4 Cans of beer per week   family history includes Heart attack in his brother and paternal grandfather; Heart disease in his father; Lung cancer in his father.    ROS: negative except as noted in the HPI  Medications: Current Outpatient Medications  Medication Sig Dispense Refill  . amLODipine (NORVASC) 5 MG tablet Take 1 tablet (5 mg total) by mouth daily. 90 tablet 1  . aspirin 81 MG EC tablet Take 1 tablet (81 mg total) by mouth daily. 30 tablet 11  . FLUoxetine (PROZAC) 20 MG capsule TAKE 1 CAPSULE DAILY 90 capsule 0  . fluticasone (FLONASE) 50 MCG/ACT nasal spray Place 2 sprays into both nostrils daily. 16 g 6  . ibuprofen (ADVIL,MOTRIN) 800 MG tablet Take 800 mg by mouth every 8 (eight) hours as needed.     No current facility-administered  medications for this visit.    Allergies  Allergen Reactions  . Codeine Nausea Only  . Paroxetine     Makes spacey  . Penicillins Diarrhea       Objective:  BP 131/78   Pulse 62   Wt 243 lb (110.2 kg)   BMI 33.89 kg/m  Gen:  alert, not ill-appearing, no distress, appropriate for age, obese male HEENT: head normocephalic without obvious abnormality, conjunctiva and cornea clear, trachea midline Pulm: Normal work of breathing, normal phonation Neuro: alert and oriented x 3, no tremor MSK: extremities atraumatic, normal gait and station Skin: intact, no rashes on exposed skin, no jaundice, no cyanosis Psych: well-groomed, cooperative, good eye contact, euthymic mood, affect mood-congruent, speech is articulate, and thought processes clear and goal-directed    No results found for this or any previous visit (from the past 72 hour(s)). US Aorta Complete  Result Date: 03/11/2018 CLINICAL DATA:  Abdominal aortic screening. EXAM: ULTRASOUND OF ABDOMINAL AORTA TECHNIQUE: Ultrasound examination of the abdominal aorta was performed to evaluate for abdominal aortic aneurysm. COMPARISON:  None  in PACs FINDINGS: Abdominal aortic measurements as follows: Proximal:  2.9 cm AP x 3.0 cm transversely. Mid:  2.5 cm AP x 2.6 cm transversely. Distal:  2.2 cm AP x 2.5 cm transversely. IMPRESSION: Ectatic abdominal aorta at risk for aneurysm development. Recommend followup by ultrasound in 5 years. This recommendation follows ACR consensus guidelines: White Paper of the ACR Incidental Findings Committee II on Vascular Findings. J Am Coll Radiol 2013; 10:789-794. Electronically Signed   By: David  SwazilandJordan M.D.   On: 03/11/2018 14:05    Lab Results  Component Value Date   CHOL 204 (H) 02/25/2018   HDL 59 02/25/2018   LDLCALC 131 (H) 02/25/2018   TRIG 52 02/25/2018   CHOLHDL 3.5 02/25/2018     Assessment and Plan: 67 y.o. male with   1. Dyslipidemia - LDL goal <70, last LDL 131 - atorvastatin  (LIPITOR) 40 MG tablet; Take 1 tablet (40 mg total) by mouth at bedtime.  Dispense: 90 tablet; Refill: 3  2. DDD (degenerative disc disease), lumbosacral - will use Naprosyn and Acetaminophen due to CV risk factors - naproxen (NAPROSYN) 500 MG tablet; Take 1 tablet (500 mg total) by mouth 2 (two) times daily with a meal.  Dispense: 60 tablet; Refill: 2  3. Abdominal aortic ectasia (HCC) - discussed diagnosis, pathophysiology and recommended surveillance. Explained very low probability of rupture at this size (<3.0 cm). Answered patient and wife's questions. Plan for follow-up US in 5 years or sooner as needed. They decline referral to vascular. - controlling blood pressure with Amlodipine and managing CVD risk factors - counseled on therapeutic lifestyle changes   Patient education and anticipatory guidance given Patient agrees with treatment plan Follow-up as needed if symptoms worsen or fail to improve  Levonne Hubertharley E. Briellah Baik PA-C

## 2018-03-13 NOTE — Patient Instructions (Signed)
Abdominal Aortic Aneurysm An aneurysm is a bulge in an artery. It happens when blood pushes up against a weakened or damaged artery wall. An abdominal aortic aneurysm is an aneurysm that occurs in the lower part of the aorta, the main artery of the body. The aorta supplies blood from the heart to the rest of the body. Some aneurysms may not cause symptoms or problems. However, the major concern with an abdominal aortic aneurysm is that it can enlarge and burst (rupture), or that blood can flow between the layers of the wall of the aorta through a tear (aorticdissection). Both of these conditions can cause bleeding inside the body and can be life-threatening unless diagnosed and treated right away. What are the causes? The exact cause of this condition is not known. What increases the risk? The following factors may make you more likely to develop this condition:  Being older than age 60.  Having a hardening of the arteries caused by the buildup of fat and other substances in the lining of a blood vessel (arteriosclerosis).  Having inflammation of the walls of an artery (arteritis).  Having a genetic disease that weakens the body's connective tissue, such as Marfan syndrome.  Having abdominal trauma.  Having an infection, such as syphilis or staphylococcus, in the wall of the aorta (infectious aortitis) caused by bacteria.  Having high blood pressure (hypertension).  Being male.  Being white (Caucasian).  Having high cholesterol.  Having a family history of aneurysms.  Using tobacco.  Having chronic obstructive pulmonary disease (COPD).  What are the signs or symptoms? Symptoms of this condition vary depending on the size and rate of growth of the aneurysm.Most aneurysms grow slowly and do not cause any symptoms. When symptoms do occur, they may include:  Pain in the abdomen, side, or lower back. The pain may vary in intensity.  Feeling full after eating only small amounts of  food.  Feeling a pulsating lump in the abdomen.  Symptoms that the aneurysm has ruptured include:  A sudden onset of severe pain in the abdomen, side, or lower back.  Nausea or vomiting.  Feeling faint or passing out.  How is this diagnosed? This condition may be diagnosed with:  A physical exam. During the exam, your health care provider will check for throbbing in your abdomen. He or she may also listen to the blood flow in your abdomen.  Tests, such as: ? An ultrasound. ? X-rays. ? A CT scan. ? An MRI. ? Tests to check your arteries for damage or blockage (angiogram).  Because most unruptured abdominal aortic aneurysms cause no symptoms, they are often found during exams for other conditions. How is this treated? Treatment for this condition depends on:  The size of the aneurysm.  How fast the aneurysm is growing.  Your age.  Risk factors for rupture.  Aneurysms that are smaller than 2 inches (5 cm) may be managed by using medicines to control blood pressure, manage pain, or fight infection. You may need regular monitoring to see if the aneurysm is getting bigger. Your health care provider may recommend that you have an ultrasound every few years, every year, or every 3-6 months. How often you need to have an ultrasound depends on the size of the aneurysm, how fast it is growing, and whether you have a family history of aneurysms. Surgical repair may be needed if your aneurysm is larger than 2 inches (5 cm). Follow these instructions at home: General instructions  Keep all   follow-up visits as told by your health care provider. This is important. ? Talk to your health care provider about regular screenings to see if the aneurysm is getting bigger.  Take over-the-counter and prescription medicines only as told by your health care provider.  Avoid heavy lifting and activities that take a lot of effort (are strenuous). Ask your health care provider what activities are  safe for you. Lifestyle Follow instructions from your health care provider about healthy lifestyle habits. Your health care provider may recommend:  Not using any products that contain nicotine or tobacco, such as cigarettes and e-cigarettes. If you need help quitting, ask your health care provider.  Limiting or avoiding alcohol.  Keeping your blood pressure within normal limits. The target limit for most people is below 120/80. Check your blood pressure regularly. If it is high, ask your health care provider about ways that you can control it.  Keeping your blood sugar level and cholesterol levels within normal limits. Target limits for most people are: ? Blood sugar level: Less than 100 mg/dL. ? Total cholesterol level: Less than 200 mg/dL.  Eating a healthy diet. This may include: ? Lowering your salt (sodium) intake. In some people, too much salt can raise blood pressure and increase the risk of abdominal aortic aneurysm. ? Avoiding foods that are high in saturated fat and cholesterol, such as red meat and dairy products. ? Eating a diet that is low in sugar. ? Increasing your fiber intake by including whole grains, vegetables, and fruits in your diet. Eating these foods may help to lower blood pressure.  Maintaining a healthy weight.  Staying physically active and exercising regularly. Talk with your health care provider about how often you should exercise and which types of exercise are safe for you.  Contact a health care provider if:  You have pain in your abdomen, side, or lower back.  You have a throbbing feeling in your abdomen.  You have a family history of aneurysms. Get help right away if:  You have sudden, severe pain in your abdomen or lower back.  You experience nausea or vomiting.  You have constipation or problems urinating.  You feel light-headed.  You have a rapid heart rate when you stand.  You have sweaty, clammy skin.  You have shortness of  breath.  You have a fever. This information is not intended to replace advice given to you by your health care provider. Make sure you discuss any questions you have with your health care provider. Document Released: 09/18/2005 Document Revised: 07/03/2016 Document Reviewed: 05/28/2016 Elsevier Interactive Patient Education  2018 [employer]  

## 2018-03-17 ENCOUNTER — Encounter: Payer: Self-pay | Admitting: Physician Assistant

## 2018-03-27 ENCOUNTER — Emergency Department (INDEPENDENT_AMBULATORY_CARE_PROVIDER_SITE_OTHER)
Admission: EM | Admit: 2018-03-27 | Discharge: 2018-03-27 | Disposition: A | Discharge status: Home / Self Care | Payer: Medicare Other | Source: Home / Self Care | Attending: Emergency Medicine | Admitting: Emergency Medicine

## 2018-03-27 ENCOUNTER — Encounter: Payer: Self-pay | Admitting: *Deleted

## 2018-03-27 ENCOUNTER — Telehealth: Payer: Self-pay | Admitting: Physician Assistant

## 2018-03-27 ENCOUNTER — Other Ambulatory Visit: Payer: Self-pay

## 2018-03-27 ENCOUNTER — Emergency Department (INDEPENDENT_AMBULATORY_CARE_PROVIDER_SITE_OTHER): Payer: Medicare Other

## 2018-03-27 DIAGNOSIS — S0300XA Dislocation of jaw, unspecified side, initial encounter: Secondary | ICD-10-CM

## 2018-03-27 DIAGNOSIS — J209 Acute bronchitis, unspecified: Secondary | ICD-10-CM

## 2018-03-27 DIAGNOSIS — R05 Cough: Secondary | ICD-10-CM

## 2018-03-27 DIAGNOSIS — F3342 Major depressive disorder, recurrent, in full remission: Secondary | ICD-10-CM

## 2018-03-27 MED ORDER — DOXYCYCLINE HYCLATE 100 MG PO CAPS: 100.0000 mg | ORAL_CAPSULE | Freq: Two times a day (BID) | ORAL | 0 refills | Status: DC

## 2018-03-27 MED ORDER — BENZONATATE 100 MG PO CAPS: 100.0000 mg | ORAL_CAPSULE | Freq: Three times a day (TID) | ORAL | 0 refills | Status: DC | PRN

## 2018-03-27 NOTE — ED Provider Notes (Signed)
[name]    CSN: [id number] Arrival date & time: 03/27/18  4098     History   Chief Complaint Chief Complaint  Patient presents with  . Cough  . Jaw Pain    HPI Ziad Maye is a 67 y.o. male.   HPI Patient enters with a 3-week history of cough.  His cough is productive of a yellowish phlegm.  He also has had nasal congestion with a yellow drainage.  He does feel a postnasal drip.  He denies any shortness of breath.  He is a non-smoker.  He did work at a tobacco plant for many years. Past Medical History:  Diagnosis Date  . Abdominal aortic ectasia (HCC) 03/12/2018  . Allergy   . Cataract   . DDD (degenerative disc disease), lumbar   . Depression   . Hyperlipidemia   . Hypertension   . RBBB   . Vasovagal syncope     Patient Active Problem List   Diagnosis Date Noted  . Abdominal aortic ectasia (HCC) 03/12/2018  . 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 Recurrent major depressive disorder, in full remission (HCC) 02/25/2018  . Encounter for long-term (current) use of medications 02/25/2018  . Encounter for abdominal aortic aneurysm (AAA) screening 02/25/2018  . Dyslipidemia, goal LDL below 100 02/25/2018  . Refused influenza vaccine 02/25/2018  . Refused pneumococcal vaccination 02/25/2018  . Pruritus 08/04/2017  . Encounter for monitoring statin therapy 08/04/2017  . Depression 05/21/2017  . Family history of heart disease in male family member before age 32 05/21/2017  . Tear of meniscus of knee 05/21/2017  . Class 1 obesity due to excess calories with serious comorbidity in adult 05/21/2017  . Allergic rhinitis 05/21/2017  . DDD (degenerative disc disease), lumbosacral 05/21/2017  . Vasovagal episode 05/21/2017  . Essential hypertension with goal blood pressure less than 140/90 08/17/2015  . Pseudophakia of both eyes 10/20/2013  . Dyslipidemia 08/10/2013    Past Surgical History:  Procedure Laterality Date  . CATARACT EXTRACTION,  BILATERAL    . KNEE ARTHROSCOPY W/ MENISCAL REPAIR Left   . ROTATOR CUFF REPAIR Right   . WISDOM TOOTH EXTRACTION         Home Medications    Prior to Admission medications   Medication Sig Start Date End Date Taking? Authorizing Provider  amLODipine (NORVASC) 5 MG tablet Take 1 tablet (5 mg total) by mouth daily. 02/25/18   Carlis Stable, PA-C  aspirin 81 MG EC tablet Take 1 tablet (81 mg total) by mouth daily. 02/25/18   Carlis Stable, PA-C  atorvastatin (LIPITOR) 40 MG tablet Take 1 tablet (40 mg total) by mouth at bedtime. 03/13/18   Carlis Stable, PA-C  benzonatate (TESSALON) 100 MG capsule Take 1-2 capsules (100-200 mg total) by mouth 3 (three) times daily as needed for cough. 03/27/18   Collene Gobble, MD  doxycycline (VIBRAMYCIN) 100 MG capsule Take 1 capsule (100 mg total) by mouth 2 (two) times daily. 03/27/18   Collene Gobble, MD  FLUoxetine (PROZAC) 20 MG capsule TAKE 1 CAPSULE DAILY 02/02/18   Carlis Stable, PA-C  fluticasone [facility]) 50 MCG/ACT nasal spray Place 2 sprays into both nostrils daily. 05/21/17   Carlis Stable, PA-C  naproxen (NAPROSYN) 500 MG tablet Take 1 tablet (500 mg total) by mouth 2 (two) times daily with a meal. 03/13/18   Carlis Stable, PA-C    Family History Family History  Problem Relation Age of Onset  . Heart  disease Father   . Lung cancer Father   . Heart attack Brother   . Heart attack Paternal Grandfather     Social History Social History   Tobacco Use  . Smoking status: Passive Smoke Exposure - Never Smoker  . Smokeless tobacco: Never Used  Substance Use Topics  . Alcohol use: Yes    Alcohol/week: 2.4 oz    Types: 4 Cans of beer per week  . Drug use: No     Allergies   Codeine; Paroxetine; Tylenol [acetaminophen]; and Penicillins   Review of Systems Review of Systems  HENT: Positive for congestion and postnasal drip.        He has had difficulty with  popping in his right jaw.  It has not been locked but pops when he tries to chew or open his mouth.  Respiratory: Positive for cough. Negative for shortness of breath and wheezing.      Physical Exam Triage Vital Signs ED Triage Vitals [03/27/18 0914]  Enc Vitals Group     BP 125/82     Pulse Rate 71     Resp 16     Temp 98.9 F (37.2 C)     Temp Source Oral     SpO2 96 %     Weight      Height      Head Circumference      Peak Flow      Pain Score 0     Pain Loc      Pain Edu?      Excl. in GC?    No data found.  Updated Vital Signs BP 125/82 (BP Location: Right Arm)   Pulse 71   Temp 98.9 F (37.2 C) (Oral)   Resp 16   SpO2 96%   Visual Acuity Right Eye Distance:   Left Eye Distance:   Bilateral Distance:    Right Eye Near:   Left Eye Near:    Bilateral Near:     Physical Exam  Constitutional: He appears well-developed and well-nourished.  HENT:  There is no popping with opening and closing of the jaw on the right.  Neck: Normal range of motion. Neck supple.  Cardiovascular:  Regular rate and rhythm without murmurs.  Pulmonary/Chest: Effort normal and breath sounds normal. No respiratory distress. He has no wheezes. He has no rales.  Musculoskeletal: He exhibits no edema.     UC Treatments / Results  Labs (all labs ordered are listed, but only abnormal results are displayed) Labs Reviewed - No data to display  EKG None Radiology Dg Chest 2 View  Result Date: 03/27/2018 CLINICAL DATA:  Productive cough for 3 weeks EXAM: CHEST - 2 VIEW COMPARISON:  None available FINDINGS: Normal heart size and vascularity. Lungs remain clear. No focal pneumonia, collapse or consolidation. Negative for edema, effusion or pneumothorax. Trachea is midline. Healed posterolateral right rib fractures noted. Diffuse thoracic spondylosis. IMPRESSION: No acute chest process by plain radiography Electronically Signed   By: Judie Petit.  Shick M.D.   On: 03/27/2018 10:00     Procedures Procedures (including critical care time)  Medications Ordered in UC Medications - No data to display   Initial Impression / Assessment and Plan / UC Course  I have reviewed the triage vital signs and the nursing notes.  Pertinent labs & imaging results that were available during my care of the patient were reviewed by me and considered in my medical decision making (see chart for details).  Patient has a respiratory illness with productive cough now going on 3 weeks.  He is a non-smoker.  He is not on any blood pressure medications to cause cough.  He also is having subluxation of his jaw on the right.  He will follow-up with the dentist regarding this.  Chest x-ray was ordered.  Chest x-ray returned no acute disease.  Final Clinical Impressions(s) / UC Diagnoses   Final diagnoses:  Acute bronchitis, unspecified organism  Subluxation of jaw, acquired, initial encounter    ED Discharge Orders        Ordered    doxycycline (VIBRAMYCIN) 100 MG capsule  2 times daily     03/27/18 1014    benzonatate (TESSALON) 100 MG capsule  3 times daily PRN     03/27/18 1014       Controlled Substance Prescriptions [place] Controlled Substance Registry consulted? Not Applicable   Collene Gobbleaub, Aydee Mcnew A, MD 03/27/18 1446

## 2018-03-27 NOTE — ED Triage Notes (Signed)
Patient c/o 3 weeks of cough, productive with colored sputum. Denies fever. Also c/o right side of jaw popping x 1 week.

## 2018-03-27 NOTE — Discharge Instructions (Addendum)
Take antibiotics as instructed. Follow-up with family doctor if not better in 1 week. Follow-up with dentist regarding your jaw problem. Take Mucinex twice a day.

## 2018-03-27 NOTE — Telephone Encounter (Signed)
Patient called and wants to know if he still needs to be seen for a refill on his Prozac. He wants it filled at [facility] on [address].  He was seen on 03/13/18. Please advise.

## 2018-03-29 MED ORDER — FLUOXETINE HCL 20 MG PO CAPS: 20.0000 mg | ORAL_CAPSULE | Freq: Every day | ORAL | 2 refills | Status: DC

## 2018-03-29 NOTE — Telephone Encounter (Signed)
Refill sent to [facility], 2 refills

## 2018-03-30 MED ORDER — FLUOXETINE HCL 20 MG PO CAPS: 20.0000 mg | ORAL_CAPSULE | Freq: Every day | ORAL | 1 refills | Status: DC

## 2018-03-30 NOTE — Telephone Encounter (Signed)
Canc[facility] and re-sent to [facility] [address] per patient request.

## 2018-04-27 ENCOUNTER — Encounter: Payer: Self-pay | Admitting: Physician Assistant

## 2018-04-27 ENCOUNTER — Ambulatory Visit (INDEPENDENT_AMBULATORY_CARE_PROVIDER_SITE_OTHER): Payer: Medicare Other | Admitting: Physician Assistant

## 2018-04-27 VITALS — BP 150/84 | HR 59 | Wt 245.0 lb

## 2018-04-27 DIAGNOSIS — S86811A Strain of other muscle(s) and tendon(s) at lower leg level, right leg, initial encounter: Secondary | ICD-10-CM

## 2018-04-27 DIAGNOSIS — I872 Venous insufficiency (chronic) (peripheral): Secondary | ICD-10-CM | POA: Diagnosis not present

## 2018-04-27 DIAGNOSIS — I10 Essential (primary) hypertension: Secondary | ICD-10-CM

## 2018-04-27 DIAGNOSIS — R609 Edema, unspecified: Secondary | ICD-10-CM

## 2018-04-27 DIAGNOSIS — R600 Localized edema: Secondary | ICD-10-CM | POA: Insufficient documentation

## 2018-04-27 DIAGNOSIS — S86819A Strain of other muscle(s) and tendon(s) at lower leg level, unspecified leg, initial encounter: Secondary | ICD-10-CM | POA: Insufficient documentation

## 2018-04-27 NOTE — Progress Notes (Signed)
HPI:                                                                Lance Anderson is a 67 y.o. male who presents to [facility] today for hypertension follow-up  Current concerns include: right leg pain  HTN: taking Amlodipine  daily. Compliant with medications. Checks BP's at home. BP range 120's-130's/78-85 Denies vision change, headache, chest pain with exertion, orthopnea, lightheadedness, syncope and edema. Risk factors include: age>55, male sex, HLD, fam hx   Right lower leg swelling x 5 days. Reports he stumbled at [employer] on Thursday. Calf is tender. Worse with walking up hill. Denies knee pain or tenderness. No history of DVT. He also has a remote history of trauma to the same leg years ago (crushing injury at work).   Depression screen [facility] 2/9 02/25/2018 08/04/2017 05/21/2017  Decreased Interest 0 0 0  Down, Depressed, Hopeless 0 0 0  PHQ - 2 Score 0 0 0  Altered sleeping 1 - -  Tired, decreased energy 0 - -  Change in appetite 1 - -  Feeling bad or failure about yourself  0 - -  Trouble concentrating 0 - -  Moving slowly or fidgety/restless 0 - -  Suicidal thoughts 0 - -  PHQ-9 Score 2 - -    No flowsheet data found.    Past Medical History:  Diagnosis Date  . Abdominal aortic ectasia (HCC) 03/12/2018  . Allergy   . Cataract   . DDD (degenerative disc disease), lumbar   . Depression   . Hyperlipidemia   . Hypertension   . RBBB   . Vasovagal syncope    Past Surgical History:  Procedure Laterality Date  . CATARACT EXTRACTION, BILATERAL    . KNEE ARTHROSCOPY W/ MENISCAL REPAIR Left   . ROTATOR CUFF REPAIR Right   . WISDOM TOOTH EXTRACTION     Social History   Tobacco Use  . Smoking status: Passive Smoke Exposure - Never Smoker  . Smokeless tobacco: Never Used  Substance Use Topics  . Alcohol use: Yes    Alcohol/week: 2.4 oz    Types: 4 Cans of beer per week   family history includes Heart  attack in his brother and paternal grandfather; Heart disease in his father; Lung cancer in his father.    ROS: negative except as noted in the HPI  Medications: Current Outpatient Medications  Medication Sig Dispense Refill  . amLODipine (NORVASC) 5 MG tablet Take 1 tablet (5 mg total) by mouth daily. 90 tablet 1  . aspirin 81 MG EC tablet Take 1 tablet (81 mg total) by mouth daily. 30 tablet 11  . atorvastatin (LIPITOR) 40 MG tablet Take 1 tablet (40 mg total) by mouth at bedtime. 90 tablet 3  . FLUoxetine (PROZAC) 20 MG capsule Take 1 capsule (20 mg total) by mouth daily. 90 capsule 1  . fluticasone (FLONASE) 50 MCG/ACT nasal spray Place 2 sprays into both nostrils daily. 16 g 6  . naproxen (NAPROSYN) 500 MG tablet Take 1 tablet (500 mg total) by mouth 2 (two) times daily with a meal. 60 tablet 2   No current facility-administered medications for this visit.    Allergies  Allergen Reactions  .  Codeine Nausea Only  . Paroxetine     Makes spacey  . Tylenol [Acetaminophen]   . Penicillins Diarrhea       Objective:  BP (!) 150/84   Pulse (!) 59   Wt 245 lb (111.1 kg)   BMI 34.17 kg/m  Gen:  alert, not ill-appearing, no distress, appropriate for age HEENT: head normocephalic without obvious abnormality, conjunctiva and cornea clear, trachea midline Pulm: Normal work of breathing, normal phonation, clear to auscultation bilaterally, no wheezes, rales or rhonchi CV: Normal rate, regular rhythm, s1 and s2 distinct, no murmurs, clicks or rubs, no carotid bruit, radial pulses 2+ symmetric Neuro: alert and oriented x 3, no tremor MSK: extremities atraumatic, normal gait and station, bilateral 2+ peripheral edema to the mid-leg Right Lower Extremity: no deformity, pain reproduced with foot dorsiflexion, knee ROM intact Skin: intact, anterior RLE with erythematous papular rash Psych: well-groomed, cooperative, good eye contact, euthymic mood, affect mood-congruent, speech is  articulate, and thought processes clear and goal-directed    No results found for this or any previous visit (from the past 72 hour(s)). No results found.    Assessment and Plan: 67 y.o. male with   Essential hypertension with goal blood pressure less than 140/90  Strain of calf muscle, right, initial encounter  Peripheral edema  Venous stasis dermatitis of right lower extremity  BP Readings from Last 3 Encounters:  04/27/18 (!) 150/84  03/27/18 125/82  03/13/18 131/78  - home readings are in range - cont Amlodipine  - cont ASA 81 mg for primary prevention - counseled on therapeutic lifestyle changes  Calf muscle strain - heel lifts - ace compression wrap - RICE protocol, particularly encouraged elevation due to venous stasis dermatitis - naprosyn 500 mg bid prn for pain - return to play reviewed    Patient education and anticipatory guidance given Patient agrees with treatment plan Follow-up in 6 months for medication management or sooner as needed if symptoms worsen or fail to improve  Levonne Hubert PA-C

## 2018-04-27 NOTE — Patient Instructions (Addendum)
For your blood pressure: - Goal <140/90 - continue Amlodipine 5 mg every morning - baby aspirin 81 mg daily to help prevent heart attack/stroke - monitor and log blood pressures at home - check around the same time each day in a relaxed setting - Limit salt to <2000 mg/day - Follow DASH eating plan - limit alcohol to 2 standard drinks per day for men and 1 per day for women - avoid tobacco products - weight loss: 7% of current body weight - follow-up every 6 months for your blood pressure, sooner if not at goal

## 2018-05-26 ENCOUNTER — Other Ambulatory Visit: Payer: Self-pay

## 2018-05-26 ENCOUNTER — Other Ambulatory Visit: Payer: Self-pay | Admitting: Physician Assistant

## 2018-05-26 ENCOUNTER — Emergency Department (INDEPENDENT_AMBULATORY_CARE_PROVIDER_SITE_OTHER)
Admission: EM | Admit: 2018-05-26 | Discharge: 2018-05-26 | Disposition: A | Discharge status: Home / Self Care | Payer: Medicare Other | Source: Home / Self Care | Attending: Family Medicine | Admitting: Family Medicine

## 2018-05-26 DIAGNOSIS — S0501XA Injury of conjunctiva and corneal abrasion without foreign body, right eye, initial encounter: Secondary | ICD-10-CM | POA: Diagnosis not present

## 2018-05-26 DIAGNOSIS — J309 Allergic rhinitis, unspecified: Secondary | ICD-10-CM

## 2018-05-26 MED ORDER — POLYMYXIN B-TRIMETHOPRIM 10000-0.1 UNIT/ML-% OP SOLN: 1.0000 [drp] | OPHTHALMIC | 0 refills | Status: DC

## 2018-05-26 NOTE — Discharge Instructions (Signed)
°  Please follow up with your family doctor or eye specialist in 3-4 days if not improving, sooner if worsening.

## 2018-05-26 NOTE — ED Triage Notes (Signed)
Around 2:30 today, while at work, pt felt right eye start getting irritated.  As the day progressed, became worse.  Pt flushed eye, but did not help.

## 2018-05-26 NOTE — ED Provider Notes (Signed)
[name]    CSN: [id number] Arrival date & time: 05/26/18  1836     History   Chief Complaint Chief Complaint  Patient presents with  . Eye Pain    HPI Lance Anderson is a 67 y.o. male.   HPI  Lance Anderson is a 67 y.o. male presenting to UC with c/o Right eye irritation that started around 2:30PM today while at work. 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 He tried flushing it but no relief. His eye is becoming more irritated throughout the day, red and tearing. No change in vision.    Past Medical History:  Diagnosis Date  . Abdominal aortic ectasia (HCC) 03/12/2018  . Allergy   . Cataract   . DDD (degenerative disc disease), lumbar   . Depression   . Hyperlipidemia   . Hypertension   . RBBB   . Vasovagal syncope     Patient Active Problem List   Diagnosis Date Noted  . Peripheral edema 04/27/2018  . Venous stasis dermatitis of right lower extremity 04/27/2018  . Strain of calf muscle 04/27/2018  . Abdominal aortic ectasia (HCC) 03/12/2018  . 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 Recurrent major depressive disorder, in full remission (HCC) 02/25/2018  . Encounter for long-term (current) use of medications 02/25/2018  . Encounter for abdominal aortic aneurysm (AAA) screening 02/25/2018  . Dyslipidemia, goal LDL below 100 02/25/2018  . Refused influenza vaccine 02/25/2018  . Refused pneumococcal vaccination 02/25/2018  . Pruritus 08/04/2017  . Encounter for monitoring statin therapy 08/04/2017  . Depression 05/21/2017  . Family history of heart disease in male family member before age 69 05/21/2017  . Tear of meniscus of knee 05/21/2017  . Class 1 obesity due to excess calories with serious comorbidity in adult 05/21/2017  . Allergic rhinitis 05/21/2017  . DDD (degenerative disc disease), lumbosacral 05/21/2017  . Vasovagal episode 05/21/2017  . Essential hypertension with goal blood pressure less than 140/90  08/17/2015  . Pseudophakia of both eyes 10/20/2013  . Dyslipidemia 08/10/2013    Past Surgical History:  Procedure Laterality Date  . CATARACT EXTRACTION, BILATERAL    . KNEE ARTHROSCOPY W/ MENISCAL REPAIR Left   . ROTATOR CUFF REPAIR Right   . WISDOM TOOTH EXTRACTION         Home Medications    Prior to Admission medications   Medication Sig Start Date End Date Taking? Authorizing Provider  amLODipine (NORVASC) 5 MG tablet Take 1 tablet (5 mg total) by mouth daily. 02/25/18   Carlis Stable, PA-C  aspirin 81 MG EC tablet Take 1 tablet (81 mg total) by mouth daily. 02/25/18   Carlis Stable, PA-C  atorvastatin (LIPITOR) 40 MG tablet Take 1 tablet (40 mg total) by mouth at bedtime. 03/13/18   Carlis Stable, PA-C  FLUoxetine (PROZAC) 20 MG capsule Take 1 capsule (20 mg total) by mouth daily. 03/30/18   Carlis Stable, PA-C  fluticasone [facility]) 50 MCG/ACT nasal spray USE 2 SPRAY(S) IN [facility] NOSTRIL ONCE DAILY 05/26/18   Carlis Stable, PA-C  naproxen (NAPROSYN) 500 MG tablet Take 1 tablet (500 mg total) by mouth 2 (two) times daily with a meal. 03/13/18   Carlis Stable, PA-C  trimethoprim-polymyxin b (POLYTRIM) ophthalmic solution Place 1 drop into the right eye every 4 (four) hours. For 5 days 05/26/18   Rolla Plate    Family History Family History  Problem Relation Age of Onset  .  Heart disease Father   . Lung cancer Father   . Heart attack Brother   . Heart attack Paternal Grandfather     Social History Social History   Tobacco Use  . Smoking status: Passive Smoke Exposure - Never Smoker  . Smokeless tobacco: Never Used  Substance Use Topics  . Alcohol use: Yes    Alcohol/week: 2.4 oz    Types: 4 Cans of beer per week  . Drug use: No     Allergies   Codeine; Paroxetine; Tylenol [acetaminophen]; and Penicillins   Review of Systems Review of Systems  Eyes: Positive for pain, discharge  (tearing) and redness. Negative for photophobia, itching and visual disturbance.  Neurological: Negative for dizziness, light-headedness and headaches.     Physical Exam Triage Vital Signs ED Triage Vitals  Enc Vitals Group     BP 05/26/18 1856 (!) 156/91     Pulse Rate 05/26/18 1856 65     Resp --      Temp 05/26/18 1856 98.6 F (37 C)     Temp Source 05/26/18 1856 Oral     SpO2 05/26/18 1856 97 %     Weight 05/26/18 1857 244 lb (110.7 kg)     Height 05/26/18 1857 5\' 11"  (1.803 m)     Head Circumference --      Peak Flow --      Pain Score 05/26/18 1856 6     Pain Loc --      Pain Edu? --      Excl. in GC? --    No data found.  Updated Vital Signs BP (!) 156/91 (BP Location: Right Arm)   Pulse 65   Temp 98.6 F (37 C) (Oral)   Ht 5\' 11"  (1.803 m)   Wt 244 lb (110.7 kg)   SpO2 97%   BMI 34.03 kg/m   Visual Acuity Right Eye Distance: 20/200 watering difficult keeping eye open due to pain  Left Eye Distance: 20/20 Bilateral Distance: 20/20  Right Eye Near:   Left Eye Near:    Bilateral Near:     Physical Exam  Constitutional: He is oriented to person, place, and time. He appears well-developed and well-nourished. No distress.  HENT:  Head: Normocephalic and atraumatic.  Mouth/Throat: Oropharynx is clear and moist.  Scant fluorescein uptake to lateral portion of cornea. No ulcerations. No foreign bodies noted on exam.  Eyes: Pupils are equal, round, and reactive to light. EOM and lids are normal. Lids are everted and swept, no foreign bodies found. Right conjunctiva is injected.  Neck: Normal range of motion.  Cardiovascular: Normal rate.  Pulmonary/Chest: Effort normal.  Musculoskeletal: Normal range of motion.  Neurological: He is alert and oriented to person, place, and time.  Skin: Skin is warm and dry. He is not diaphoretic.  Psychiatric: He has a normal mood and affect. His behavior is normal.  Nursing note and vitals reviewed.    UC Treatments /  Results  Labs (all labs ordered are listed, but only abnormal results are displayed) Labs Reviewed - No data to display  EKG None  Radiology No results found.  Procedures Procedures (including critical care time)  Medications Ordered in UC Medications - No data to display  Initial Impression / Assessment and Plan / UC Course  I have reviewed the triage vital signs and the nursing notes.  Pertinent labs & imaging results that were available during my care of the patient were reviewed by me and considered in my  medical decision making (see chart for details).     No foreign bodies seen on exam. Will tx abrasion.  Final Clinical Impressions(s) / UC Diagnoses   Final diagnoses:  Corneal abrasion, right, initial encounter     Discharge Instructions      Please follow up with your family doctor or eye specialist in 3-4 days if not improving, sooner if worsening.     ED Prescriptions    Medication Sig Dispense Auth. Provider   trimethoprim-polymyxin b (POLYTRIM) ophthalmic solution Place 1 drop into the right eye every 4 (four) hours. For 5 days 10 mL Lurene Shadow, PA-C     Controlled Substance Prescriptions [place] Controlled Substance Registry consulted? Not Applicable   Rolla Plate 05/26/18 1941

## 2018-05-29 ENCOUNTER — Encounter: Payer: Self-pay | Admitting: Physician Assistant

## 2018-05-29 ENCOUNTER — Ambulatory Visit (INDEPENDENT_AMBULATORY_CARE_PROVIDER_SITE_OTHER): Payer: Medicare Other | Admitting: Physician Assistant

## 2018-05-29 VITALS — BP 162/72 | HR 56 | Wt 242.0 lb

## 2018-05-29 DIAGNOSIS — R001 Bradycardia, unspecified: Secondary | ICD-10-CM | POA: Diagnosis not present

## 2018-05-29 DIAGNOSIS — I4510 Unspecified right bundle-branch block: Secondary | ICD-10-CM

## 2018-05-29 DIAGNOSIS — I10 Essential (primary) hypertension: Secondary | ICD-10-CM | POA: Diagnosis not present

## 2018-05-29 DIAGNOSIS — E6609 Other obesity due to excess calories: Secondary | ICD-10-CM | POA: Diagnosis not present

## 2018-05-29 DIAGNOSIS — Z0000 Encounter for general adult medical examination without abnormal findings: Secondary | ICD-10-CM | POA: Diagnosis not present

## 2018-05-29 NOTE — Progress Notes (Addendum)
Subjective:   Lance Anderson is a 67 y.o. male who presents for an Initial Medicare Annual Wellness Visit.  Review of Systems  Review of Systems  All other systems reviewed and are negative.        Objective:    Today's Vitals   05/29/18 0840 05/29/18 0910  BP: (!) 154/86 (!) 162/72  Pulse: (!) 56   Weight: 242 lb (109.8 kg)    Body mass index is 33.75 kg/m.  Gen:  alert, not ill-appearing, no distress, appropriate for age, obese male HEENT: head normocephalic without obvious abnormality, conjunctiva and cornea clear, trachea midline Pulm: Normal work of breathing, normal phonation, clear to auscultation bilaterally, no wheezes, rales or rhonchi CV: Normal rate, regular rhythm, s1 and s2 distinct, no murmurs, clicks or rubs  Neuro: alert and oriented x 3, no tremor MSK: extremities atraumatic, normal gait and station Skin: intact, no rashes on exposed skin, no jaundice, no cyanosis Psych: well-groomed, cooperative, good eye contact, euthymic mood, affect mood-congruent, speech is articulate, and thought processes clear and goal-directed  No flowsheet data found.  Current Medications (verified) Outpatient Encounter Medications as of 05/29/2018  Medication Sig  . amLODipine (NORVASC) 5 MG tablet Take 1 tablet (5 mg total) by mouth daily.  Marland Kitchen. aspirin 81 MG EC tablet Take 1 tablet (81 mg total) by mouth daily.  Marland Kitchen. atorvastatin (LIPITOR) 40 MG tablet Take 1 tablet (40 mg total) by mouth at bedtime.  Marland Kitchen. FLUoxetine (PROZAC) 20 MG capsule Take 1 capsule (20 mg total) by mouth daily.  . fluticasone (FLONASE) 50 MCG/ACT nasal spray USE 2 SPRAY(S) IN EACH NOSTRIL ONCE DAILY  . naproxen (NAPROSYN) 500 MG tablet Take 1 tablet (500 mg total) by mouth 2 (two) times daily with a meal.  . trimethoprim-polymyxin b (POLYTRIM) ophthalmic solution Place 1 drop into the right eye every 4 (four) hours. For 5 days   No facility-administered encounter medications on file as of 05/29/2018.      Allergies (verified) Codeine; Paroxetine; and Penicillins   History: Past Medical History:  Diagnosis Date  . Abdominal aortic ectasia (HCC) 03/12/2018  . Allergy   . Cataract   . DDD (degenerative disc disease), lumbar   . Depression   . Hyperlipidemia   . Hypertension   . RBBB   . Vasovagal syncope    Past Surgical History:  Procedure Laterality Date  . CATARACT EXTRACTION, BILATERAL    . KNEE ARTHROSCOPY W/ MENISCAL REPAIR Left   . ROTATOR CUFF REPAIR Right   . WISDOM TOOTH EXTRACTION     Family History  Problem Relation Age of Onset  . Heart disease Father   . Lung cancer Father   . Heart attack Brother   . Heart attack Paternal Grandfather    Social History   Socioeconomic History  . Marital status: Married    Spouse name: Not on file  . Number of children: Not on file  . Years of education: Not on file  . Highest education level: Not on file  Occupational History  . Not on file  Social Needs  . Financial resource strain: Not on file  . Food insecurity:    Worry: Not on file    Inability: Not on file  . Transportation needs:    Medical: Not on file    Non-medical: Not on file  Tobacco Use  . Smoking status: Passive Smoke Exposure - Never Smoker  . Smokeless tobacco: Never Used  Substance and Sexual Activity  . Alcohol  use: Yes    Alcohol/week: 2.4 oz    Types: 4 Cans of beer per week  . Drug use: No  . Sexual activity: Not Currently  Lifestyle  . Physical activity:    Days per week: Not on file    Minutes per session: Not on file  . Stress: Not on file  Relationships  . Social connections:    Talks on phone: Not on file    Gets together: Not on file    Attends religious service: Not on file    Active member of club or organization: Not on file    Attends meetings of clubs or organizations: Not on file    Relationship status: Not on file  Other Topics Concern  . Not on file  Social History Narrative  . Not on file   Tobacco  Counseling Counseling given: Not Answered      Activities of Daily Living In your present state of health, do you have any difficulty performing the following activities: 05/29/2018  Hearing? N  Vision? N  Difficulty concentrating or making decisions? N  Walking or climbing stairs? N  Dressing or bathing? N  Some recent data might be hidden     Immunizations and Health Maintenance Immunization History  Administered Date(s) Administered  . Td 10/09/2002  . Tdap 12/30/2012  . Zoster 08/10/2013   There are no preventive care reminders to display for this patient.  Patient Care Team: Riki Rusk as PCP - General (Physician Assistant)  Indicate any recent Medical Services you may have received from other than [facility] providers in the past year (date may be approximate).    Assessment:   This is a routine wellness examination for Lance Anderson.  Dietary issues and exercise activities discussed:    Goals    None     Depression Screen PHQ 2/9 Scores 05/29/2018 05/29/2018 02/25/2018 08/04/2017  PHQ - 2 Score 0 0 0 0  PHQ- 9 Score 3 3 2  -    Fall Risk Fall Risk  05/29/2018  Falls in the past year? Yes  Number falls in past yr: 2 or more  Injury with Fall? Yes  Risk Factor Category  High Fall Risk  Risk for fall due to : History of fall(s)  Follow up Falls evaluation completed;Education provided    Is the patient's home free of loose throw rugs in walkways, pet beds, electrical cords, etc?   no      Grab bars in the bathroom? no      Handrails on the stairs?   yes      Adequate lighting?   yes  Timed Get Up and Go performed: n/a  Cognitive Function:     6CIT Screen 05/29/2018  What Year? 0 points  What month? 0 points  What time? 0 points  Count back from 20 0 points  Months in reverse 0 points  Repeat phrase 0 points  Total Score 0    Screening Tests Health Maintenance  Topic Date Due  . INFLUENZA VACCINE  10/28/2018 (Originally 07/23/2018)  . PNA vac  Low Risk Adult (1 of 2 - PCV13) 02/26/2019 (Originally 01/16/2018)  . TETANUS/TDAP  12/30/2022  . COLONOSCOPY  06/04/2024  . Hepatitis C Screening  Completed  . HIV Screening  Discontinued    Qualifies for Shingles Vaccine? Zostavax 2014  Cancer Screenings: Lung: Low Dose CT Chest recommended if Age 14-80 years, 30 pack-year currently smoking OR have quit w/in 15years. Patient does not qualify. Colorectal:  UTD 06/04/14  Additional Screenings:  Hepatitis C Screening: negative 02/18/17 AAA screening: ectasia  ECG Vent rate 51 bpm PR- 200 ms QRS 154 ms QT/Qtc 474/436 Sinus bradycardia, right bundle branch block, inferior infarct age undetermined    Plan:     I have personally reviewed and noted the following in the patient's chart:   . Medical and social history . Use of alcohol, tobacco or illicit drugs  . Current medications and supplements . Functional ability and status . Nutritional status . Physical activity . Advanced directives - provided with info . List of other physicians - n/a . Hospitalizations, surgeries, and ER visits in previous 12 months . Vitals - elevated BP in office on 2 checks today with diagnosis of HTN, patient to monitor BP's at home and recheck in 3 weeks . Screenings to include cognitive, depression, and falls - positive fall screen, patient does not have gait disturbance of leg weakness, trips and falls with physical activity, counseled on fall prevention . Referrals and appointments - n/a . ECG - sinus brady, RBBB, personally reviewed with Dr. Rodney Langton, possible old inferior infarct, patient is asymptomatic  In addition, I have reviewed and discussed with patient certain preventive protocols, quality metrics, and best practice recommendations. A written personalized care plan for preventive services as well as general preventive health recommendations were provided to patient.     Carlis Stable, [place]   05/29/2018

## 2018-05-29 NOTE — Patient Instructions (Signed)
Preventive Care 65 Years and Older, Male Preventive care refers to lifestyle choices and visits with your health care provider that can promote health and wellness. What does preventive care include?  A yearly physical exam. This is also called an annual well check.  Dental exams once or twice a year.  Routine eye exams. Ask your health care provider how often you should have your eyes checked.  Personal lifestyle choices, including: ? Daily care of your teeth and gums. ? Regular physical activity. ? Eating a healthy diet. ? Avoiding tobacco and drug use. ? Limiting alcohol use. ? Practicing safe sex. ? Taking low doses of aspirin every day. ? Taking vitamin and mineral supplements as recommended by your health care provider. What happens during an annual well check? The services and screenings done by your health care provider during your annual well check will depend on your age, overall health, lifestyle risk factors, and family history of disease. Counseling Your health care provider may ask you questions about your:  Alcohol use.  Tobacco use.  Drug use.  Emotional well-being.  Home and relationship well-being.  Sexual activity.  Eating habits.  History of falls.  Memory and ability to understand (cognition).  Work and work environment.  Screening You may have the following tests or measurements:  Height, weight, and BMI.  Blood pressure.  Lipid and cholesterol levels. These may be checked every 5 years, or more frequently if you are over 50 years old.  Skin check.  Lung cancer screening. You may have this screening every year starting at age 55 if you have a 30-pack-year history of smoking and currently smoke or have quit within the past 15 years.  Fecal occult blood test (FOBT) of the stool. You may have this test every year starting at age 50.  Flexible sigmoidoscopy or colonoscopy. You may have a sigmoidoscopy every 5 years or a colonoscopy every 10  years starting at age 50.  Prostate cancer screening. Recommendations will vary depending on your family history and other risks.  Hepatitis C blood test.  Hepatitis B blood test.  Sexually transmitted disease (STD) testing.  Diabetes screening. This is done by checking your blood sugar (glucose) after you have not eaten for a while (fasting). You may have this done every 1-3 years.  Abdominal aortic aneurysm (AAA) screening. You may need this if you are a current or former smoker.  Osteoporosis. You may be screened starting at age 70 if you are at high risk.  Talk with your health care provider about your test results, treatment options, and if necessary, the need for more tests. Vaccines Your health care provider may recommend certain vaccines, such as:  Influenza vaccine. This is recommended every year.  Tetanus, diphtheria, and acellular pertussis (Tdap, Td) vaccine. You may need a Td booster every 10 years.  Varicella vaccine. You may need this if you have not been vaccinated.  Zoster vaccine. You may need this after age 60.  Measles, mumps, and rubella (MMR) vaccine. You may need at least one dose of MMR if you were born in 1957 or later. You may also need a second dose.  Pneumococcal 13-valent conjugate (PCV13) vaccine. One dose is recommended after age 65.  Pneumococcal polysaccharide (PPSV23) vaccine. One dose is recommended after age 65.  Meningococcal vaccine. You may need this if you have certain conditions.  Hepatitis A vaccine. You may need this if you have certain conditions or if you travel or work in places where you   may be exposed to hepatitis A.  Hepatitis B vaccine. You may need this if you have certain conditions or if you travel or work in places where you may be exposed to hepatitis B.  Haemophilus influenzae type b (Hib) vaccine. You may need this if you have certain risk factors.  Talk to your health care provider about which screenings and vaccines  you need and how often you need them. This information is not intended to replace advice given to you by your health care provider. Make sure you discuss any questions you have with your health care provider. Document Released: 01/05/2016 Document Revised: 08/28/2016 Document Reviewed: 10/10/2015 Elsevier Interactive Patient Education  2018 Elsevier Inc.  

## 2018-05-29 NOTE — Addendum Note (Signed)
Addended by: Gena FrayUMMINGS, Chandrika Sandles E on: 05/29/2018 11:08 AM   Modules accepted: Orders

## 2018-06-19 ENCOUNTER — Encounter: Payer: Self-pay | Admitting: Physician Assistant

## 2018-06-19 ENCOUNTER — Ambulatory Visit (INDEPENDENT_AMBULATORY_CARE_PROVIDER_SITE_OTHER): Payer: Medicare Other | Admitting: Physician Assistant

## 2018-06-19 VITALS — BP 142/80 | HR 62

## 2018-06-19 DIAGNOSIS — I10 Essential (primary) hypertension: Secondary | ICD-10-CM

## 2018-06-19 MED ORDER — AMLODIPINE BESYLATE 10 MG PO TABS: 10.0000 mg | ORAL_TABLET | Freq: Every day | ORAL | 1 refills | Status: DC

## 2018-06-19 NOTE — Progress Notes (Signed)
Pt came into clinic today for NV BP check. At last OV, he was advised to take amlodipine 5mg  tablets and keep a home BP log. He did bring home log in with him today. Readings range from 122/82-147/87. Pt's first BP check in office today was above goal. Did let Pt sit then recheck. Went over home log and BP readings from today with PCP. Per PCP, Pt to increase amlodipine to 10mg  daily. Will send new Rx to mail order, Pt will take 2 of his 5mg  tabs until the mail order arrives. Pt to follow up with PCP in 3 months. Verbalized understanding.   Vitals:   06/19/18 0833 06/19/18 0840  BP: (!) 144/84 (!) 142/80  Pulse: (!) 59 62

## 2018-07-17 ENCOUNTER — Encounter: Payer: Self-pay | Admitting: Family Medicine

## 2018-07-17 ENCOUNTER — Ambulatory Visit (INDEPENDENT_AMBULATORY_CARE_PROVIDER_SITE_OTHER): Payer: Medicare Other | Admitting: Family Medicine

## 2018-07-17 VITALS — BP 137/81 | HR 60 | Temp 97.9°F | Ht 65.0 in | Wt 247.8 lb

## 2018-07-17 DIAGNOSIS — M25512 Pain in left shoulder: Secondary | ICD-10-CM | POA: Diagnosis not present

## 2018-07-17 NOTE — Patient Instructions (Signed)
Thank you for coming in today. Attend PT Recheck following PT.

## 2018-07-17 NOTE — Progress Notes (Signed)
Lance Anderson is a 67 y.o. male who presents to [facility] today for left shoulder pain.  [name] he was seen about 6 months ago for left shoulder pain after a fall.  He was thought to have a rotator cuff tendinitis.  He was given a subacromial bursa injection and home exercise program.  He was advised to return if symptoms did not improve in 1 month.  He notes the shot helped for about a month and he slowly stopped doing home exercise programs.  He notes he continues to experience left shoulder pain slightly worsened over the last few months.  He notes pain is worse with reaching back at night and with overhead activity.  He denies any radiating pain weakness or numbness.  He denies any repeat injury.  No fevers or chills nausea vomiting or diarrhea.    ROS:  As above  Exam:  BP 137/81   Pulse 60   Temp 97.9 F (36.6 C) (Oral)   Ht 5\' 5"  (1.651 m)   Wt 247 lb 12.8 oz (112.4 kg)   BMI 41.24 kg/m  General: Well Developed, well nourished, and in no acute distress.  Neuro/Psych: Alert and oriented x3, extra-ocular muscles intact, able to move all 4 extremities, sensation grossly intact. Skin: Warm and dry, no rashes noted.  Respiratory: Not using accessory muscles, speaking in full sentences, trachea midline.  Cardiovascular: Pulses palpable, no extremity edema. Abdomen: Does not appear distended. MSK:  C-spine nontender normal motion. Left shoulder normal-appearing nontender. Normal motion to abduction and external rotation.  Internal rotation limited to the lumbar spine. Positive Hawkins and Neer's test. Positive empty can test. Strength 5/5 internal, 5/5 abduction, 4/5 external with pain  Right shoulder normal-appearing normal motion normal strength negative impingement testing.  Pulses capillary refill and sensation intact bilateral upper extremities.     Lab and Radiology Results EXAM: LEFT SHOULDER - 2+ VIEW  COMPARISON:   None.  FINDINGS: Mild degenerative changes in the left [facility] and glenohumeral joints with joint space narrowing and spurring. No acute bony abnormality. Specifically, no fracture, subluxation, or dislocation.  IMPRESSION: Mild degenerative changes.  No acute bony abnormality.   Electronically Signed   By: Charlett Nose M.D.   On: 12/31/2017 11:39 I personally (independently) visualized and performed the interpretation of the images attached in this note.     Assessment and Plan: 67 y.o. male with  Left shoulder pain: Likely continued rotator cuff tendinopathy.  Patient symptoms are mild but ongoing and do interfere with his life activities.  Discussed options.  Plan for dedicated trial of physical therapy.  If not better next step would be likely MRI for surgical planning.  Doubtful that repeat injection would be immediately helpful since the first injection provided benefit but only for a month.    Orders Placed This Encounter  Procedures  . Ambulatory referral to Physical Therapy    Referral Priority:   Routine    Referral Type:   Physical Medicine    Referral Reason:   Specialty Services Required    Requested Specialty:   Physical Therapy   No orders of the defined types were placed in this encounter.   Historical information moved to improve visibility of documentation.  Past Medical History:  Diagnosis Date  . Abdominal aortic ectasia (HCC) 03/12/2018  . Allergy   . Cataract   . DDD (degenerative disc disease), lumbar   . Depression   . Hyperlipidemia   . Hypertension   .  RBBB   . Vasovagal syncope    Past Surgical History:  Procedure Laterality Date  . CATARACT EXTRACTION, BILATERAL    . KNEE ARTHROSCOPY W/ MENISCAL REPAIR Left   . ROTATOR CUFF REPAIR Right   . WISDOM TOOTH EXTRACTION     Social History   Tobacco Use  . Smoking status: Passive Smoke Exposure - Never Smoker  . Smokeless tobacco: Never Used  Substance Use Topics  . Alcohol use: Yes     Alcohol/week: 2.4 oz    Types: 4 Cans of beer per week   family history includes Heart attack in his brother and paternal grandfather; Heart disease in his father; Lung cancer in his father.  Medications: Current Outpatient Medications  Medication Sig Dispense Refill  . amLODipine (NORVASC) 10 MG tablet Take 1 tablet (10 mg total) by mouth daily. 90 tablet 1  . aspirin 81 MG EC tablet Take 1 tablet (81 mg total) by mouth daily. 30 tablet 11  . atorvastatin (LIPITOR) 40 MG tablet Take 1 tablet (40 mg total) by mouth at bedtime. 90 tablet 3  . FLUoxetine (PROZAC) 20 MG capsule Take 1 capsule (20 mg total) by mouth daily. 90 capsule 1  . fluticasone (FLONASE) 50 MCG/ACT nasal spray USE 2 SPRAY(S) IN EACH NOSTRIL ONCE DAILY 16 g 1  . naproxen (NAPROSYN) 500 MG tablet Take 1 tablet (500 mg total) by mouth 2 (two) times daily with a meal. 60 tablet 2  . VITAMIN D, CHOLECALCIFEROL, PO Take by mouth.     No current facility-administered medications for this visit.    Allergies  Allergen Reactions  . Codeine Other (See Comments)    Chest pain  . Paroxetine Other (See Comments)    Makes spacey  . Penicillins Diarrhea      Discussed warning signs or symptoms. Please see discharge instructions. Patient expresses understanding.

## 2018-07-21 ENCOUNTER — Other Ambulatory Visit: Payer: Self-pay | Admitting: Physician Assistant

## 2018-07-21 DIAGNOSIS — J309 Allergic rhinitis, unspecified: Secondary | ICD-10-CM

## 2018-07-29 ENCOUNTER — Ambulatory Visit (INDEPENDENT_AMBULATORY_CARE_PROVIDER_SITE_OTHER): Payer: Medicare Other | Admitting: Rehabilitative and Restorative Service Providers"

## 2018-07-29 ENCOUNTER — Encounter: Payer: Self-pay | Admitting: Rehabilitative and Restorative Service Providers"

## 2018-07-29 DIAGNOSIS — R293 Abnormal posture: Secondary | ICD-10-CM | POA: Diagnosis not present

## 2018-07-29 DIAGNOSIS — M25512 Pain in left shoulder: Secondary | ICD-10-CM

## 2018-07-29 DIAGNOSIS — G8929 Other chronic pain: Secondary | ICD-10-CM

## 2018-07-29 DIAGNOSIS — R29898 Other symptoms and signs involving the musculoskeletal system: Secondary | ICD-10-CM | POA: Diagnosis not present

## 2018-07-29 NOTE — Patient Instructions (Signed)
Axial Extension (Chin Tuck)    Pull chin in and lengthen back of neck. Hold __5__ seconds while counting out loud. Repeat __10__ times. Do __several__ sessions per day.  Shoulder Blade Squeeze    Rotate shoulders back, then squeeze shoulder blades down and back. Hold 10 sec Repeat _10___ times. Do _several ___ sessions per day.  Scapula Adduction With Pectoralis Stretch: Low - Standing   Shoulders at 45 hands even with shoulders, keeping weight through legs, shift weight forward until you feel pull or stretch through the front of your chest. Hold _30__ seconds. Do _3__ times, _2-4__ times per day.   Scapula Adduction With Pectoralis Stretch: Mid-Range - Standing   Shoulders at 90 elbows even with shoulders, keeping weight through legs, shift weight forward until you feel pull or strength through the front of your chest. Hold __30_ seconds. Do _3__ times, __2-4_ times per day.   Scapula Adduction With Pectoralis Stretch: High - Standing   Shoulders at 120 hands up high on the doorway, keeping weight on feet, shift weight forward until you feel pull or stretch through the front of your chest. Hold _30__ seconds. Do _3__ times, _2-3__ times per day.  Self massage using ~ 4 inch plastic ball    TENS UNIT: This is helpful for muscle pain and spasm.   Search and Purchase a TENS 7000 2nd edition at www.tenspros.com. It should be less than $30.     TENS unit instructions: Do not shower or bathe with the unit on Turn the unit off before removing electrodes or batteries If the electrodes lose stickiness add a drop of water to the electrodes after they are disconnected from the unit and place on plastic sheet. If you continued to have difficulty, call the TENS unit company to purchase more electrodes. Do not apply lotion on the skin area prior to use. Make sure the skin is clean and dry as this will help prolong the life of the electrodes. After use, always check skin for  unusual red areas, rash or other skin difficulties. If there are any skin problems, does not apply electrodes to the same area. Never remove the electrodes from the unit by pulling the wires. Do not use the TENS unit or electrodes other than as directed. Do not change electrode placement without consultating your therapist or physician. Keep 2 fingers with between each electrode.   Trigger Point Dry Needling  . What is Trigger Point Dry Needling (DN)? o DN is a physical therapy technique used to treat muscle pain and dysfunction. Specifically, DN helps deactivate muscle trigger points (muscle knots).  o A thin filiform needle is used to penetrate the skin and stimulate the underlying trigger point. The goal is for a local twitch response (LTR) to occur and for the trigger point to relax. No medication of any kind is injected during the procedure.   . What Does Trigger Point Dry Needling Feel Like?  o The procedure feels different for each individual patient. Some patients report that they do not actually feel the needle enter the skin and overall the process is not painful. Very mild bleeding may occur. However, many patients feel a deep cramping in the muscle in which the needle was inserted. This is the local twitch response.   Marland Kitchen. How Will I feel after the treatment? o Soreness is normal, and the onset of soreness may not occur for a few hours. Typically this soreness does not last longer than two days.  o Bruising  is uncommon, however; ice can be used to decrease any possible bruising.  o In rare cases feeling tired or nauseous after the treatment is normal. In addition, your symptoms may get worse before they get better, this period will typically not last longer than 24 hours.   . What Can I do After My Treatment? o Increase your hydration by drinking more water for the next 24 hours. o You may place ice or heat on the areas treated that have become sore, however, do not use heat on  inflamed or bruised areas. Heat often brings more relief post needling. o You can continue your regular activities, but vigorous activity is not recommended initially after the treatment for 24 hours. o DN is best combined with other physical therapy such as strengthening, stretching, and other therapies.       [facility] [address] [place], [place] [address]  [phone] (office) [phone] (fax)

## 2018-07-29 NOTE — Therapy (Signed)
[facility] [place] [address] [place], [place], [address] Phone: [phone]   Fax:  [phone]  Physical Therapy Evaluation  Patient Details  Name: Lance Anderson MRN: [id number] Date of Birth: March 12, 1951 Referring Provider: Dr Lance Anderson    Encounter Date: 07/29/2018  PT End of Session - 07/29/18 0942    Visit Number  1    Number of Visits  12    Date for PT Re-Evaluation  09/09/18    PT Start Time  0838    PT Stop Time  0946    PT Time Calculation (min)  68 min    Activity Tolerance  Patient tolerated treatment well       Past Medical History:  Diagnosis Date  . Abdominal aortic ectasia (HCC) 03/12/2018  . Allergy   . Cataract   . DDD (degenerative disc disease), lumbar   . Depression   . Hyperlipidemia   . Hypertension   . RBBB   . Vasovagal syncope     Past Surgical History:  Procedure Laterality Date  . CATARACT EXTRACTION, BILATERAL    . KNEE ARTHROSCOPY W/ MENISCAL REPAIR Left   . ROTATOR CUFF REPAIR Right   . WISDOM TOOTH EXTRACTION      There were no vitals filed for this visit.   Subjective Assessment - 07/29/18 0905    Subjective  Patient reports that he has had Lt shoulder pain 4-5 years with symptoms gradually worsening in the past 6 months. He had an injection 6 months ago with some improvement for ~ 1 month. Symptoms have gradually increased again. He has no known injury to Lt shoulder     Pertinent History  Lt knee microfracture; Rt shoulder RCR ~ 20 yrs ago     Diagnostic tests  xray (-)     Patient Stated Goals  get rid of the shoulder pain     Currently in Pain?  Yes    Pain Score  2     Pain Location  Shoulder    Pain Orientation  Left    Pain Descriptors / Indicators  Nagging    Pain Type  Chronic pain    Pain Radiating Towards  down into the arm     Pain Onset  More than a month ago    Pain Frequency  Constant    Aggravating Factors   lying on the couchwith Lt arm propped on the back of  the couch; lifting - especially over waist height; reaching behind back    Pain Relieving Factors  meds         [facility] PT Assessment - 07/29/18 0001      Assessment   Medical Diagnosis  Lt shoulder dysfunction     Referring Provider  Dr Lance Anderson     Onset Date/Surgical Date  01/23/18 increasing pain over the past 3-4 yrs     Hand Dominance  Right    Next MD Visit  PRN     Prior Therapy  Rt shoulder Lt knee       Precautions   Precautions  None      Balance Screen   Has the patient fallen in the past 6 months  No    Has the patient had a decrease in activity level because of a fear of falling?   No    Is the patient reluctant to leave their home because of a fear of falling?   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; retired from [employer]     Leisure  yard work; play with grandkids; 4 wheeler; bike      Observation/Other Assessments   Focus on Therapeutic Outcomes (FOTO)   28% limitation       Sensation   Additional Comments  WFL's per pt report       AROM   Right Shoulder Extension  72 Degrees    Right Shoulder Flexion  147 Degrees    Right Shoulder ABduction  163 Degrees    Right Shoulder Internal Rotation  32 Degrees    Right Shoulder External Rotation  88 Degrees    Left Shoulder Extension  48 Degrees    Left Shoulder Flexion  152 Degrees discomfort    Left Shoulder ABduction  160 Degrees    Left Shoulder Internal Rotation  30 Degrees    Left Shoulder External Rotation  90 Degrees      Strength   Right/Left Shoulder  -- 5/5 bilat UE's       Palpation   Spinal mobility  hypomobile thoracic with CPA mobs     Palpation comment  muscular tightness Lt > Rt pecs; upper traps; leveator; teres; lateral cervical                 Objective measurements completed on examination: See above  findings.      [facility] Adult PT Treatment/Exercise - 07/29/18 0001      Therapeutic Activites    Therapeutic Activities  -- myofacial ball release work       Neuro Re-ed    Neuro Re-ed Details   postural correction engaging posterior shoulder girdle      Shoulder Exercises: Standing   Other Standing Exercises  axial extension 10 sec x 5; scap squeeze 10 sec x 10 with noodle       Shoulder Exercises: Stretch   Other Shoulder Stretches  3 way doorway 30 sec x 1 each       Moist Heat Therapy   Number Minutes Moist Heat  20 Minutes    Moist Heat Location  Shoulder thoracic spine       Electrical Stimulation   Electrical Stimulation Location  Lt shoulder girdle    Electrical Stimulation Action  IFC    Electrical Stimulation Parameters  to tolerance    Electrical Stimulation Goals  Tone;Pain             PT Education - 07/29/18 1610    Education Details  HEP; POC; TENS; DN    Person(s) Educated  Patient    Methods  Explanation;Demonstration;Tactile cues;Verbal cues;Handout    Comprehension  Verbalized understanding;Returned demonstration;Verbal cues required;Tactile cues required          PT Long Term Goals - 07/29/18 1008      PT LONG TERM GOAL #1   Title  Improve posture and alignment with patient to demonstrate improved alignment with posterior shoulder girdle musculature engaged 09/09/18    Time  6    Period  Weeks    Status  New      PT LONG TERM GOAL #2   Title  Decrease pain by 50-75% Lt shoulder with functional activities 09/09/18    Time  6    Period  Weeks      PT  LONG TERM GOAL #3   Title  Decrease muscular tightness to palpatioin through shoulder girdle 09/09/18    Time  6    Period  Weeks    Status  New      PT LONG TERM GOAL #4   Title  Independent in HEP 09/09/18    Time  6    Period  Weeks    Status  New      PT LONG TERM GOAL #5   Title  Improve FOTO to </= 26% limitation 09/09/18    Time  6    Period  Weeks    Status  New              Plan - 07/29/18 0954    Clinical Impression Statement  Lance Anderson presents with 3-4 year history of Lt shoudler pain which has been worse in the past 6-8 months. He received an injection to Lt shoulder ~ 6 months ago with some short term improvement.  He has noted increasing pain in the past few months. Patient has poor posture and alignment; limited thoracic mobility; decreased shoulder ROM/mobility; poor movement quality UE's; muscular tightness to palpation through Lt shoulder girdle; pain effecting functional activities. Patient will benefit from PT to address problems identified.     History and Personal Factors relevant to plan of care:  Rt RCR ~ 20 yrs ago    Clinical Presentation  Stable    Clinical Presentation due to:  decresaed thoracic mobility; poor posture and alignment     Clinical Decision Making  Low    Rehab Potential  Good    Clinical Impairments Affecting Rehab Potential  long standing nature of symptomsshoudler dysfunction     PT Frequency  2x / week    PT Duration  6 weeks    PT Treatment/Interventions  Patient/family education;ADLs/Self Care Home Management;Cryotherapy;Electrical Stimulation;Iontophoresis 4mg /ml Dexamethasone;Moist Heat;Ultrasound;Dry needling;Manual techniques;Neuromuscular re-education;Therapeutic activities;Therapeutic exercise    PT Next Visit Plan  review HEP; add supine snow angel; add manual work vs DN to anterior chest/pecs; thoracic spine mobs; progress exercise as tolerated; modalities as indicated possibly ionto     Consulted and Agree with Plan of Care  Patient       Patient will benefit from skilled therapeutic intervention in order to improve the following deficits and impairments:  Postural dysfunction, Improper body mechanics, Pain, Increased fascial restricitons, Increased muscle spasms, Decreased mobility, Decreased range of motion, Decreased activity tolerance  Visit Diagnosis: Chronic left shoulder pain - Plan: PT plan of  care cert/re-cert  Other symptoms and signs involving the musculoskeletal system - Plan: PT plan of care cert/re-cert  Abnormal posture - Plan: PT plan of care cert/re-cert     Problem List Patient Active Problem List   Diagnosis Date Noted  . Elevated blood pressure reading in office with diagnosis of hypertension 05/29/2018  . Right bundle branch block (RBBB) 05/29/2018  . Sinus bradycardia 05/29/2018  . Peripheral edema 04/27/2018  . Venous stasis dermatitis of right lower extremity 04/27/2018  . Strain of calf muscle 04/27/2018  . Abdominal aortic ectasia (HCC) 03/12/2018  . 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 Recurrent major depressive disorder, in full remission (HCC) 02/25/2018  . Encounter for long-term (current) use of medications 02/25/2018  . Encounter for abdominal aortic aneurysm (AAA) screening 02/25/2018  . Dyslipidemia, goal LDL below 100 02/25/2018  . Refused influenza vaccine 02/25/2018  . Refused pneumococcal vaccination 02/25/2018  . Pruritus 08/04/2017  . Encounter for monitoring statin therapy 08/04/2017  .  Depression 05/21/2017  . Family history of heart disease in male family member before age 65 05/21/2017  . Tear of meniscus of knee 05/21/2017  . Class 1 obesity due to excess calories with serious comorbidity in adult 05/21/2017  . Allergic rhinitis 05/21/2017  . DDD (degenerative disc disease), lumbosacral 05/21/2017  . Vasovagal episode 05/21/2017  . Essential hypertension with goal blood pressure less than 140/90 08/17/2015  . Pseudophakia of both eyes 10/20/2013  . Dyslipidemia 08/10/2013    Tamico Mundo Rober MinionP Emalyn Schou PT, MPH  07/29/2018, 10:18 AM  [facility] [address] [address] [place], [place], [address] Phone: [phone]   Fax:  [phone]  Name: Lance GottronJohnny Anderson MRN: [id number] Date of Birth: June 09, 1951

## 2018-07-31 ENCOUNTER — Other Ambulatory Visit: Payer: Self-pay

## 2018-07-31 DIAGNOSIS — I10 Essential (primary) hypertension: Secondary | ICD-10-CM

## 2018-07-31 MED ORDER — AMLODIPINE BESYLATE 10 MG PO TABS: 10.0000 mg | ORAL_TABLET | Freq: Every day | ORAL | 1 refills | Status: DC

## 2018-08-03 ENCOUNTER — Ambulatory Visit (INDEPENDENT_AMBULATORY_CARE_PROVIDER_SITE_OTHER): Payer: Medicare Other | Admitting: Rehabilitative and Restorative Service Providers"

## 2018-08-03 DIAGNOSIS — R293 Abnormal posture: Secondary | ICD-10-CM | POA: Diagnosis not present

## 2018-08-03 DIAGNOSIS — R29898 Other symptoms and signs involving the musculoskeletal system: Secondary | ICD-10-CM

## 2018-08-03 DIAGNOSIS — G8929 Other chronic pain: Secondary | ICD-10-CM | POA: Diagnosis not present

## 2018-08-03 DIAGNOSIS — M25512 Pain in left shoulder: Secondary | ICD-10-CM

## 2018-08-03 NOTE — Patient Instructions (Addendum)
  Upper Back Strength: Lower Trapezius / Rotator Cuff " L's "     Arms in waitress pose, palms up. Press hands back and slide shoulder blades down. Hold for __5__ seconds. Repeat _10___ times. 1-2 times per day.    Scapular Retraction: Elbow Flexion (Standing)  "W's"     With elbows bent to 90, pinch shoulder blades together and rotate arms out, keeping elbows bent. Repeat __10__ times per set. Do __1-2__ sets per session. Do _several ___ sessions per day.  Resisted External Rotation: in Neutral - Bilateral   PALMS UP Sit or stand, tubing in both hands, elbows at sides, bent to 90, forearms forward. Pinch shoulder blades together and rotate forearms out. Keep elbows at sides. Repeat __10__ times per set. Do _2-3___ sets per session. Do _2-3___ sessions per day.   Low Row: Standing   Face anchor, feet shoulder width apart. Palms up, pull arms back, squeezing shoulder blades together. Repeat 10__ times per set. Do 2-3__ sets per session. Do 1__ sessions per week. Anchor Height: Waist   Strengthening: Resisted Extension   Hold tubing in right hand, arm forward. Pull arm back, elbow straight. Repeat _10___ times per set. Do 2-3____ sets per session. Do 1___ sessions per day.   SUPINE Tips A    Being in the supine position means to be lying on the back. Lying on the back is the position of least compression on the bones and discs of the spine, and helps to re-align the natural curves of the back. Work toward 2-5 min - can bend elbows to release the stretch as needed then straighten arms out again.    [facility] [address] [place], [place] [address]  [phone] (office) [phone] (fax)

## 2018-08-03 NOTE — Therapy (Signed)
[facility] [place] [address] [place], [place], [address] Phone: [phone]   Fax:  [phone]  Physical Therapy Treatment  Patient Details  Name: Lance Anderson MRN: [id number] Date of Birth: [birth year] Referring Provider: Dr Denyse Amass    Encounter Date: 08/03/2018  PT End of Session - 08/03/18 0715    Visit Number  2    Number of Visits  12    Date for PT Re-Evaluation  09/09/18    PT Start Time  0716    PT Stop Time  0814    PT Time Calculation (min)  58 min    Activity Tolerance  Patient tolerated treatment well       Past Medical History:  Diagnosis Date  . Abdominal aortic ectasia (HCC) 03/12/2018  . Allergy   . Cataract   . DDD (degenerative disc disease), lumbar   . Depression   . Hyperlipidemia   . Hypertension   . RBBB   . Vasovagal syncope     Past Surgical History:  Procedure Laterality Date  . CATARACT EXTRACTION, BILATERAL    . KNEE ARTHROSCOPY W/ MENISCAL REPAIR Left   . ROTATOR CUFF REPAIR Right   . WISDOM TOOTH EXTRACTION      There were no vitals filed for this visit.  Subjective Assessment - 08/03/18 0723    Subjective  Patient reports that his shoulder felt better when he left therapy and has continued to feel better. He is in the process of ordering a TENS unit. Very pleased with his progress.     Currently in Pain?  No/denies                       [facility] Adult PT Treatment/Exercise - 08/03/18 0001      Shoulder Exercises: Standing   Extension  Strengthening;Right;Left;10 reps;Theraband    Theraband Level (Shoulder Extension)  Level 3 (Green)    Row  Strengthening;Right;Left;10 reps;Theraband    Theraband Level (Shoulder Row)  Level 3 (Green)    Retraction  Strengthening;Right;Left;10 reps;Theraband    Theraband Level (Shoulder Retraction)  Level 2 (Red)    Other Standing Exercises  axial extension 10 sec x 5; scap squeeze 10 sec x 10 with noodle; L's x 10; W's x 10        Shoulder Exercises: Stretch   Other Shoulder Stretches  3 way doorway 30 sec x 1 each     Other Shoulder Stretches  prolonged snow angel ~ 2 min UE's ~ 80 deg       Moist Heat Therapy   Number Minutes Moist Heat  20 Minutes    Moist Heat Location  Shoulder   thoracic spine      Electrical Stimulation   Electrical Stimulation Location  Lt shoulder girdle    Electrical Stimulation Action  IFC    Electrical Stimulation Parameters  to tolerance    Electrical Stimulation Goals  Tone;Pain      Manual Therapy   Manual therapy comments  pt supine     Soft tissue mobilization  deep tissue work anterior shd/pecs; upper trap; deltoids; biceps     Passive ROM  Lt shoulder extension; ER; horizontal abd              PT Education - 08/03/18 0739    Education Details  HEP     Person(s) Educated  Patient    Methods  Explanation;Demonstration;Tactile cues;Verbal cues;Handout    Comprehension  Verbalized understanding;Returned demonstration;Verbal cues  required;Tactile cues required          PT Long Term Goals - 08/03/18 0718      PT LONG TERM GOAL #1   Title  Improve posture and alignment with patient to demonstrate improved alignment with posterior shoulder girdle musculature engaged 09/09/18    Time  6    Period  Weeks    Status  On-going      PT LONG TERM GOAL #2   Title  Decrease pain by 50-75% Lt shoulder with functional activities 09/09/18    Time  6    Period  Weeks    Status  On-going      PT LONG TERM GOAL #3   Title  Decrease muscular tightness to palpatioin through shoulder girdle 09/09/18    Time  6    Period  Weeks    Status  On-going      PT LONG TERM GOAL #4   Title  Independent in HEP 09/09/18    Time  6    Period  Weeks    Status  On-going      PT LONG TERM GOAL #5   Title  Improve FOTO to </= 26% limitation 09/09/18    Time  6    Period  Weeks    Status  On-going            Plan - 08/03/18 0758    Clinical Impression Statement  Excellent  response to initial treatment and HEP. Added exercises without difficulty. Palpable tightness through anterior shoulder. REsponded well to manual work and modalities.     Rehab Potential  Good    PT Frequency  2x / week    PT Treatment/Interventions  Patient/family education;ADLs/Self Care Home Management;Cryotherapy;Electrical Stimulation;Iontophoresis 4mg /ml Dexamethasone;Moist Heat;Ultrasound;Dry needling;Manual techniques;Neuromuscular re-education;Therapeutic activities;Therapeutic exercise    PT Next Visit Plan  review HEP; manual work vs DN to anterior chest/pecs; thoracic spine mobs; progress exercise as tolerated; modalities as indicated possibly ionto     Consulted and Agree with Plan of Care  Patient       Patient will benefit from skilled therapeutic intervention in order to improve the following deficits and impairments:  Postural dysfunction, Improper body mechanics, Pain, Increased fascial restricitons, Increased muscle spasms, Decreased mobility, Decreased range of motion, Decreased activity tolerance  Visit Diagnosis: Chronic left shoulder pain  Other symptoms and signs involving the musculoskeletal system  Abnormal posture     Problem List Patient Active Problem List   Diagnosis Date Noted  . Elevated blood pressure reading in office with diagnosis of hypertension 05/29/2018  . Right bundle branch block (RBBB) 05/29/2018  . Sinus bradycardia 05/29/2018  . Peripheral edema 04/27/2018  . Venous stasis dermatitis of right lower extremity 04/27/2018  . Strain of calf muscle 04/27/2018  . Abdominal aortic ectasia (HCC) 03/12/2018  . 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 Recurrent major depressive disorder, in full remission (HCC) 02/25/2018  . Encounter for long-term (current) use of medications 02/25/2018  . Encounter for abdominal aortic aneurysm (AAA) screening 02/25/2018  . Dyslipidemia, goal LDL below 100 02/25/2018  . Refused influenza vaccine  02/25/2018  . Refused pneumococcal vaccination 02/25/2018  . Pruritus 08/04/2017  . Encounter for monitoring statin therapy 08/04/2017  . Depression 05/21/2017  . Family history of heart disease in male family member before age 65 05/21/2017  . Tear of meniscus of knee 05/21/2017  . Class 1 obesity due to excess calories with serious comorbidity in adult 05/21/2017  . Allergic rhinitis  05/21/2017  . DDD (degenerative disc disease), lumbosacral 05/21/2017  . Vasovagal episode 05/21/2017  . Essential hypertension with goal blood pressure less than 140/90 08/17/2015  . Pseudophakia of both eyes 10/20/2013  . Dyslipidemia 08/10/2013    Arleatha Philipps Rober MinionP Odean Mcelwain PT, MPH 08/03/2018, 8:00 AM  [facility] [address] [place], [place], [address] Phone: [phone]   Fax:  [phone]  Name: Lance GottronJohnny Anderson MRN: [id number] Date of Birth: 1951-06-25

## 2018-08-05 ENCOUNTER — Other Ambulatory Visit: Payer: Self-pay

## 2018-08-05 ENCOUNTER — Emergency Department (INDEPENDENT_AMBULATORY_CARE_PROVIDER_SITE_OTHER)
Admission: EM | Admit: 2018-08-05 | Discharge: 2018-08-05 | Disposition: A | Discharge status: Home / Self Care | Payer: Medicare Other | Source: Home / Self Care | Attending: Family Medicine | Admitting: Family Medicine

## 2018-08-05 DIAGNOSIS — L299 Pruritus, unspecified: Secondary | ICD-10-CM | POA: Diagnosis not present

## 2018-08-05 MED ORDER — PREDNISONE 20 MG PO TABS: 20.0000 mg | ORAL_TABLET | Freq: Two times a day (BID) | ORAL | 0 refills | Status: AC

## 2018-08-05 MED ORDER — CETIRIZINE HCL 5 MG PO TABS: 5.0000 mg | ORAL_TABLET | Freq: Every day | ORAL | 0 refills | Status: DC

## 2018-08-05 NOTE — ED Triage Notes (Signed)
Pt has been having itching, mostly legs and lower back for a while now.  Notices it more when he is in the sun.  No rash, has tried hydrocortisone cream, no help.

## 2018-08-05 NOTE — ED Provider Notes (Signed)
[facility]    CSN: [id number] Arrival date & time: 08/05/18  0825     History   Chief Complaint Chief Complaint  Patient presents with  . Pruritis    HPI Lance Anderson is a 67 y.o. male.   HPI  Lance Anderson is a 67 y.o. male presenting to UC with c/o diffuse itching that has been going on "for a while," several years. It was worse last night that it has been in the past. Pt would like some relief. He did see his PCP for similar symptoms in the past and was prescribed hydroxyzine but states that did not help last night. He went to the pool yesterday with his grandson and worked in the yard but denies rashes. He has had itching in the past being in the sun even without swimming in the pool so he does not believe it is the chlorine and he has tried various times of sunscreen but he continues to itch. He has not f/u with dermatology yet.    Past Medical History:  Diagnosis Date  . Abdominal aortic ectasia (HCC) 03/12/2018  . Allergy   . Cataract   . DDD (degenerative disc disease), lumbar   . Depression   . Hyperlipidemia   . Hypertension   . RBBB   . Vasovagal syncope     Patient Active Problem List   Diagnosis Date Noted  . Elevated blood pressure reading in office with diagnosis of hypertension 05/29/2018  . Right bundle branch block (RBBB) 05/29/2018  . Sinus bradycardia 05/29/2018  . Peripheral edema 04/27/2018  . Venous stasis dermatitis of right lower extremity 04/27/2018  . Strain of calf muscle 04/27/2018  . Abdominal aortic ectasia (HCC) 03/12/2018  . 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 Recurrent major depressive disorder, in full remission (HCC) 02/25/2018  . Encounter for long-term (current) use of medications 02/25/2018  . Encounter for abdominal aortic aneurysm (AAA) screening 02/25/2018  . Dyslipidemia, goal LDL below 100 02/25/2018  . Refused influenza vaccine 02/25/2018  . Refused pneumococcal vaccination 02/25/2018  . Pruritus  08/04/2017  . Encounter for monitoring statin therapy 08/04/2017  . Depression 05/21/2017  . Family history of heart disease in male family member before age 65 05/21/2017  . Tear of meniscus of knee 05/21/2017  . Class 1 obesity due to excess calories with serious comorbidity in adult 05/21/2017  . Allergic rhinitis 05/21/2017  . DDD (degenerative disc disease), lumbosacral 05/21/2017  . Vasovagal episode 05/21/2017  . Essential hypertension with goal blood pressure less than 140/90 08/17/2015  . Pseudophakia of both eyes 10/20/2013  . Dyslipidemia 08/10/2013    Past Surgical History:  Procedure Laterality Date  . CATARACT EXTRACTION, BILATERAL    . KNEE ARTHROSCOPY W/ MENISCAL REPAIR Left   . ROTATOR CUFF REPAIR Right   . WISDOM TOOTH EXTRACTION         Home Medications    Prior to Admission medications   Medication Sig Start Date End Date Taking? Authorizing Provider  amLODipine (NORVASC) 10 MG tablet Take 1 tablet (10 mg total) by mouth daily. 07/31/18   Carlis Stableummings, Charley Elizabeth, PA-C  aspirin 81 MG EC tablet Take 1 tablet (81 mg total) by mouth daily. 02/25/18   Carlis Stableummings, Charley Elizabeth, PA-C  atorvastatin (LIPITOR) 40 MG tablet Take 1 tablet (40 mg total) by mouth at bedtime. 03/13/18   Carlis Stableummings, Charley Elizabeth, PA-C  cetirizine (ZYRTEC) 5 MG tablet Take 1-2 tablets (5-10 mg total) by mouth daily. 08/05/18   Doroteo GlassmanPhelps,  Terran Klinke O, PA-C  FLUoxetine (PROZAC) 20 MG capsule Take 1 capsule (20 mg total) by mouth daily. 03/30/18   Carlis Stable, PA-C  fluticasone [facility]) 50 MCG/ACT nasal spray USE 2 SPRAY(S) IN [facility] NOSTRIL ONCE DAILY 07/21/18   Carlis Stable, PA-C  naproxen (NAPROSYN) 500 MG tablet Take 1 tablet (500 mg total) by mouth 2 (two) times daily with a meal. 03/13/18   Carlis Stable, PA-C  predniSONE (DELTASONE) 20 MG tablet Take 1 tablet (20 mg total) by mouth 2 (two) times daily with a meal for 5 days. 08/05/18 08/10/18  Lurene Shadow, PA-C  VITAMIN D, CHOLECALCIFEROL, PO Take by mouth.    [provider]    Family History Family History  Problem Relation Age of Onset  . Heart disease Father   . Lung cancer Father   . Heart attack Brother   . Heart attack Paternal Grandfather     Social History Social History   Tobacco Use  . Smoking status: Passive Smoke Exposure - Never Smoker  . Smokeless tobacco: Never Used  Substance Use Topics  . Alcohol use: Yes    Alcohol/week: 4.0 standard drinks    Types: 4 Cans of beer per week  . Drug use: No     Allergies   Codeine; Paroxetine; and Penicillins   Review of Systems Review of Systems  Constitutional: Negative for chills and fever.  HENT: Negative for facial swelling and trouble swallowing.   Gastrointestinal: Negative for nausea and vomiting.  Skin: Negative for rash.     Physical Exam Triage Vital Signs ED Triage Vitals  Enc Vitals Group     BP 08/05/18 0843 123/66     Pulse Rate 08/05/18 0843 60     Resp --      Temp 08/05/18 0843 97.7 F (36.5 C)     Temp Source 08/05/18 0843 Oral     SpO2 08/05/18 0843 97 %     Weight 08/05/18 0844 246 lb (111.6 kg)     Height 08/05/18 0844 5\' 11"  (1.803 m)     Head Circumference --      Peak Flow --      Pain Score 08/05/18 0844 0     Pain Loc --      Pain Edu? --      Excl. in GC? --    No data found.  Updated Vital Signs BP 123/66 (BP Location: Right Arm)   Pulse 60   Temp 97.7 F (36.5 C) (Oral)   Ht 5\' 11"  (1.803 m)   Wt 246 lb (111.6 kg)   SpO2 97%   BMI 34.31 kg/m   Visual Acuity Right Eye Distance:   Left Eye Distance:   Bilateral Distance:    Right Eye Near:   Left Eye Near:    Bilateral Near:     Physical Exam  Constitutional: He is oriented to person, place, and time. He appears well-developed and well-nourished. No distress.  HENT:  Head: Normocephalic and atraumatic.  Eyes: EOM are normal.  Neck: Normal range of motion.  Cardiovascular: Normal rate.    Pulmonary/Chest: Effort normal. No respiratory distress.  Musculoskeletal: Normal range of motion.  Neurological: He is alert and oriented to person, place, and time.  Skin: Skin is warm and dry. No rash noted. He is not diaphoretic. No erythema.  Psychiatric: He has a normal mood and affect. His behavior is normal.  Nursing note and vitals reviewed.    UC Treatments /  Results  Labs (all labs ordered are listed, but only abnormal results are displayed) Labs Reviewed - No data to display  EKG None  Radiology No results found.  Procedures Procedures (including critical care time)  Medications Ordered in UC Medications - No data to display  Initial Impression / Assessment and Plan / UC Course  I have reviewed the triage vital signs and the nursing notes.  Pertinent labs & imaging results that were available during my care of the patient were reviewed by me and considered in my medical decision making (see chart for details).     No rash noted on exam. Cause of pt's reported chronic pruritis unknown. Will try trial of prednisone and cetirizine  Final Clinical Impressions(s) / UC Diagnoses   Final diagnoses:  Itching     Discharge Instructions      Please follow up with your family doctor or with a dermatologist if not improving in 1 week.    ED Prescriptions    Medication Sig Dispense Auth. Provider   predniSONE (DELTASONE) 20 MG tablet Take 1 tablet (20 mg total) by mouth 2 (two) times daily with a meal for 5 days. 10 tablet Waylan RocherPhelps, Jonika Critz O, PA-C   cetirizine (ZYRTEC) 5 MG tablet Take 1-2 tablets (5-10 mg total) by mouth daily. 30 tablet Lurene ShadowPhelps, Daveena Elmore O, PA-C     Controlled Substance Prescriptions [place] Controlled Substance Registry consulted? Not Applicable   Rolla Platehelps, Mahsa Hanser O, PA-C 08/05/18 [id number]

## 2018-08-05 NOTE — Discharge Instructions (Signed)
°  Please follow up with your family doctor or with a dermatologist if not improving in 1 week.

## 2018-08-07 ENCOUNTER — Ambulatory Visit (INDEPENDENT_AMBULATORY_CARE_PROVIDER_SITE_OTHER): Payer: Medicare Other | Admitting: Rehabilitative and Restorative Service Providers"

## 2018-08-07 ENCOUNTER — Encounter: Payer: Self-pay | Admitting: Rehabilitative and Restorative Service Providers"

## 2018-08-07 DIAGNOSIS — R29898 Other symptoms and signs involving the musculoskeletal system: Secondary | ICD-10-CM | POA: Diagnosis not present

## 2018-08-07 DIAGNOSIS — M25512 Pain in left shoulder: Secondary | ICD-10-CM

## 2018-08-07 DIAGNOSIS — R293 Abnormal posture: Secondary | ICD-10-CM

## 2018-08-07 DIAGNOSIS — G8929 Other chronic pain: Secondary | ICD-10-CM | POA: Diagnosis not present

## 2018-08-07 NOTE — Patient Instructions (Addendum)
Stepping under doorway with arms overhead  30 sec x 3 reps    T at wall - turn body to the right 30 sec x 3

## 2018-08-07 NOTE — Therapy (Addendum)
[facility] [place], [place], [address] Phone: [phone]   Fax:  [phone]  Physical Therapy Treatment  Patient Details  Name: Lance Anderson MRN: [id number] Date of Birth: Oct 11, 1951 Referring Provider: Dr Georgina Snell    Encounter Date: 08/07/2018  PT End of Session - 08/07/18 0848    Visit Number  3    PT Start Time  6962    PT Stop Time  0943    PT Time Calculation (min)  56 min    Activity Tolerance  Patient tolerated treatment well       Past Medical History:  Diagnosis Date  . Abdominal aortic ectasia ([place]) 03/12/2018  . Allergy   . Cataract   . DDD (degenerative disc disease), lumbar   . Depression   . Hyperlipidemia   . Hypertension   . RBBB   . Vasovagal syncope     Past Surgical History:  Procedure Laterality Date  . CATARACT EXTRACTION, BILATERAL    . KNEE ARTHROSCOPY W/ MENISCAL REPAIR Left   . ROTATOR CUFF REPAIR Right   . WISDOM TOOTH EXTRACTION      There were no vitals filed for this visit.  Subjective Assessment - 08/07/18 0851    Subjective  Shoulder is improving but he went to water aerobics with his wife and that irritates the shoulder every time he does it.     Currently in Pain?  No/denies                       [facility] Adult PT Treatment/Exercise - 08/07/18 0001      Shoulder Exercises: Standing   Extension  Strengthening;Right;Left;10 reps;Theraband    Theraband Level (Shoulder Extension)  Level 3 (Green)    Row  Strengthening;Right;Left;10 reps;Theraband    Theraband Level (Shoulder Row)  Level 3 (Green)    Retraction  Strengthening;Right;Left;10 reps;Theraband    Theraband Level (Shoulder Retraction)  Level 2 (Red)    Other Standing Exercises  axial extension 10 sec x 5; scap squeeze 10 sec x 10 with noodle; L's x 10; W's x 10    reach for the floor      Shoulder Exercises: Stretch   Wall Stretch - ABduction Limitations  horizontal abduction at wall  30 sec x 2     Other Shoulder Stretches  3 way doorway 30 sec x 2 each; stepping under doorway to stretch into shd flexion 30 sec x 2      Other Shoulder Stretches  prolonged snow angel ~ 2 min UE's ~ 90 deg  on swim noodle       Moist Heat Therapy   Number Minutes Moist Heat  20 Minutes    Moist Heat Location  Shoulder   thoracic spine      Electrical Stimulation   Electrical Stimulation Location  Lt shoulder girdle    Electrical Stimulation Action  IFC    Electrical Stimulation Parameters  to tolerance    Electrical Stimulation Goals  Tone;Pain      Manual Therapy   Manual therapy comments  pt supine     Soft tissue mobilization  deep tissue work anterior shd/pecs; upper trap; deltoids; biceps     Passive ROM  Lt shoulder extension; ER; horizontal abd              PT Education - 08/07/18 0934    Education Details  HEP    Person(s) Educated  Patient    Methods  Explanation;Demonstration;Tactile cues;Verbal cues;Handout    Comprehension  Verbalized understanding;Returned demonstration;Verbal cues required;Tactile cues required          PT Long Term Goals - 08/03/18 0718      PT LONG TERM GOAL #1   Title  Improve posture and alignment with patient to demonstrate improved alignment with posterior shoulder girdle musculature engaged 09/09/18    Time  6    Period  Weeks    Status  On-going      PT LONG TERM GOAL #2   Title  Decrease pain by 50-75% Lt shoulder with functional activities 09/09/18    Time  6    Period  Weeks    Status  On-going      PT LONG TERM GOAL #3   Title  Decrease muscular tightness to palpatioin through shoulder girdle 09/09/18    Time  6    Period  Weeks    Status  On-going      PT LONG TERM GOAL #4   Title  Independent in HEP 09/09/18    Time  6    Period  Weeks    Status  On-going      PT LONG TERM GOAL #5   Title  Improve FOTO to </= 26% limitation 09/09/18    Time  6    Period  Weeks    Status  On-going            Plan -  08/07/18 0853    Clinical Impression Statement  Some flare up of shoulder pain yesterday. Patient did a water aerobic class and shoveled dirt for several hours Wednesday - very sore yesterday. Patient continues to hve muscular tightness through the anterior Lt shoulder. Added exercises without difficulty. Progressing well toward stated goals of therapy.     Rehab Potential  Good    PT Frequency  2x / week    PT Duration  6 weeks    PT Treatment/Interventions  Patient/family education;ADLs/Self Care Home Management;Cryotherapy;Electrical Stimulation;Iontophoresis 78m/ml Dexamethasone;Moist Heat;Ultrasound;Dry needling;Manual techniques;Neuromuscular re-education;Therapeutic activities;Therapeutic exercise    PT Next Visit Plan  review HEP; manual work vs DN to anterior chest/pecs; thoracic spine mobs; progress exercise as tolerated; modalities as indicated possibly ionto     Consulted and Agree with Plan of Care  Patient       Patient will benefit from skilled therapeutic intervention in order to improve the following deficits and impairments:  Postural dysfunction, Improper body mechanics, Pain, Increased fascial restricitons, Increased muscle spasms, Decreased mobility, Decreased range of motion, Decreased activity tolerance  Visit Diagnosis: Chronic left shoulder pain  Other symptoms and signs involving the musculoskeletal system  Abnormal posture     Problem List Patient Active Problem List   Diagnosis Date Noted  . Elevated blood pressure reading in office with diagnosis of hypertension 05/29/2018  . Right bundle branch block (RBBB) 05/29/2018  . Sinus bradycardia 05/29/2018  . Peripheral edema 04/27/2018  . Venous stasis dermatitis of right lower extremity 04/27/2018  . Strain of calf muscle 04/27/2018  . Abdominal aortic ectasia ([place] 03/12/2018  . 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 Recurrent major depressive disorder, in full remission ([place] 02/25/2018  .  Encounter for long-term (current) use of medications 02/25/2018  . Encounter for abdominal aortic aneurysm (AAA) screening 02/25/2018  . Dyslipidemia, goal LDL below 100 02/25/2018  . Refused influenza vaccine 02/25/2018  . Refused pneumococcal vaccination 02/25/2018  . Pruritus 08/04/2017  . Encounter for monitoring statin therapy 08/04/2017  . Depression  05/21/2017  . Family history of heart disease in male family member before age 39 05/21/2017  . Tear of meniscus of knee 05/21/2017  . Class 1 obesity due to excess calories with serious comorbidity in adult 05/21/2017  . Allergic rhinitis 05/21/2017  . DDD (degenerative disc disease), lumbosacral 05/21/2017  . Vasovagal episode 05/21/2017  . Essential hypertension with goal blood pressure less than 140/90 08/17/2015  . Pseudophakia of both eyes 10/20/2013  . Dyslipidemia 08/10/2013    Schelly Chuba Nilda Simmer PT, MPH  08/07/2018, 9:36 AM  [facility] [place], [place], [address] Phone: [phone]   Fax:  [phone]  Name: Ivaan Liddy MRN: [id number] Date of Birth: [birth year]  PHYSICAL THERAPY DISCHARGE SUMMARY  Visits from Start of Care: 3  Current functional level related to goals / functional outcomes: See last progress note for discharge status    Remaining deficits: Unknown    Education / Equipment: HEP  Plan: Patient agrees to discharge.  Patient goals were not met. Patient is being discharged due to being pleased with the current functional level.  ?????     Renaldo Gornick P. Helene Kelp PT, MPH 09/04/18 4:22 PM

## 2018-09-17 ENCOUNTER — Other Ambulatory Visit: Payer: Self-pay | Admitting: Physician Assistant

## 2018-09-17 DIAGNOSIS — F3342 Major depressive disorder, recurrent, in full remission: Secondary | ICD-10-CM

## 2018-09-28 ENCOUNTER — Ambulatory Visit: Payer: Medicare Other | Admitting: Physician Assistant

## 2018-10-26 ENCOUNTER — Ambulatory Visit: Payer: Medicare Other | Admitting: Physician Assistant

## 2018-10-30 ENCOUNTER — Ambulatory Visit (INDEPENDENT_AMBULATORY_CARE_PROVIDER_SITE_OTHER): Payer: Medicare Other | Admitting: Physician Assistant

## 2018-10-30 ENCOUNTER — Encounter: Payer: Self-pay | Admitting: Physician Assistant

## 2018-10-30 ENCOUNTER — Ambulatory Visit (INDEPENDENT_AMBULATORY_CARE_PROVIDER_SITE_OTHER): Payer: Medicare Other

## 2018-10-30 VITALS — BP 147/80 | HR 62 | Wt 249.0 lb

## 2018-10-30 DIAGNOSIS — M47892 Other spondylosis, cervical region: Secondary | ICD-10-CM | POA: Diagnosis not present

## 2018-10-30 DIAGNOSIS — I10 Essential (primary) hypertension: Secondary | ICD-10-CM

## 2018-10-30 DIAGNOSIS — M4802 Spinal stenosis, cervical region: Secondary | ICD-10-CM

## 2018-10-30 DIAGNOSIS — M542 Cervicalgia: Secondary | ICD-10-CM | POA: Diagnosis not present

## 2018-10-30 DIAGNOSIS — M5412 Radiculopathy, cervical region: Secondary | ICD-10-CM | POA: Diagnosis not present

## 2018-10-30 NOTE — Patient Instructions (Addendum)
For your blood pressure: - Goal <130/80 (Ideally 120's/70's) - Contact me if your BP is consistently 150/90. We will need to add another medication - take your blood pressure medication in the morning (unless instructed differently) - take baby aspirin 81 mg daily to help prevent heart attack/stroke - monitor and log blood pressures at home - check around the same time each day in a relaxed setting - Limit salt to <2500 mg/day - Follow DASH (Dietary Approach to Stopping Hypertension) eating plan - Try to get at least 150 minutes of aerobic exercise per week - Aim to go on a brisk walk 30 minutes per day at least 5 days per week. If you're not active, gradually increase how long you walk by 5 minutes each week - limit alcohol: 2 standard drinks per day for men and 1 per day for women - avoid tobacco/nicotine products. Consider smoking cessation if you smoke - weight loss: 7% of current body weight can reduce your blood pressure by 5-10 points - follow-up at least every 6 months for your blood pressure. Follow-up sooner if your BP is not controlled   Cervical Radiculopathy Cervical radiculopathy happens when a nerve in the neck (cervical nerve) is pinched or bruised. This condition can develop because of an injury or as part of the normal aging process. Pressure on the cervical nerves can cause pain or numbness that runs from the neck all the way down into the arm and fingers. Usually, this condition gets better with rest. Treatment may be needed if the condition does not improve. What are the causes? This condition may be caused by:  Injury.  Slipped (herniated) disk.  Muscle tightness in the neck because of overuse.  Arthritis.  Breakdown or degeneration in the bones and joints of the spine (spondylosis) due to aging.  Bone spurs that may develop near the cervical nerves.  What are the signs or symptoms? Symptoms of this condition include:  Pain that runs from the neck to the arm  and hand. The pain can be severe or irritating. It may be worse when the neck is moved.  Numbness or weakness in the affected arm and hand.  How is this diagnosed? This condition may be diagnosed based on symptoms, medical history, and a physical exam. You may also have tests, including:  X-rays.  CT scan.  MRI.  Electromyogram (EMG).  Nerve conduction tests.  How is this treated? In many cases, treatment is not needed for this condition. With rest, the condition usually gets better over time. If treatment is needed, options may include:  Wearing a soft neck collar for short periods of time.  Physical therapy to strengthen your neck muscles.  Medicines, such as NSAIDs, oral corticosteroids, or spinal injections.  Surgery. This may be needed if other treatments do not help. Various types of surgery may be done depending on the cause of your problems.  Follow these instructions at home: Managing pain  Take over-the-counter and prescription medicines only as told by your health care provider.  If directed, apply ice to the affected area. ? Put ice in a plastic bag. ? Place a towel between your skin and the bag. ? Leave the ice on for 20 minutes, 2-3 times per day.  If ice does not help, you can try using heat. Take a warm shower or warm bath, or use a heat pack as told by your health care provider.  Try a gentle neck and shoulder massage to help relieve symptoms. Activity  Rest as needed. Follow instructions from your health care provider about any restrictions on activities.  Do stretching and strengthening exercises as told by your health care provider or physical therapist. General instructions  If you were given a soft collar, wear it as told by your health care provider.  Use a flat pillow when you sleep.  Keep all follow-up visits as told by your health care provider. This is important. Contact a health care provider if:  Your condition does not improve with  treatment. Get help right away if:  Your pain gets much worse and cannot be controlled with medicines.  You have weakness or numbness in your hand, arm, face, or leg.  You have a high fever.  You have a stiff, rigid neck.  You lose control of your bowels or your bladder (have incontinence).  You have trouble with walking, balance, or speaking. This information is not intended to replace advice given to you by your health care provider. Make sure you discuss any questions you have with your health care provider. Document Released: 09/03/2001 Document Revised: 05/16/2016 Document Reviewed: 02/02/2015 Elsevier Interactive Patient Education  [employer].

## 2018-10-30 NOTE — Progress Notes (Signed)
HPI:                                                                Lance Anderson is a 67 y.o. male who presents to [facility]: Primary Care Sports Medicine today for hypertension f/u  HTN: taking Amlodipine 10 mg daily. Compliant with medications. Reports BP readings have been increased recently. BP range 140's/80's. Reports he has been vacationing more, eating unhealthy, has gained 6 pounds.  Denies vision change, headache, chest pain with exertion, orthopnea, lightheadedness, syncope and edema. Risk factors include: age>55, male sex, HLD, fam hx  Neck pain: reports developing "crick in my neck" on the right side about 1 month ago. Reports pain has gradually migrated from his neck toward his shoulder and upper arm. Pain is moderate, persistent. He is now having numbness/tingling in his right hand.  Denies constitutional symptoms, headache, focal weakness, tremors.  Has been taking Naproxen with mild relief.     Past Medical History:  Diagnosis Date  . Abdominal aortic ectasia (HCC) 03/12/2018  . Allergy   . Cataract   . DDD (degenerative disc disease), lumbar   . Depression   . Hyperlipidemia   . Hypertension   . RBBB   . Vasovagal syncope    Past Surgical History:  Procedure Laterality Date  . CATARACT EXTRACTION, BILATERAL    . KNEE ARTHROSCOPY W/ MENISCAL REPAIR Left   . ROTATOR CUFF REPAIR Right   . WISDOM TOOTH EXTRACTION     Social History   Tobacco Use  . Smoking status: Passive Smoke Exposure - Never Smoker  . Smokeless tobacco: Never Used  Substance Use Topics  . Alcohol use: Yes    Alcohol/week: 4.0 standard drinks    Types: 4 Cans of beer per week   family history includes Heart attack in his brother and paternal grandfather; Heart disease in his father; Lung cancer in his father.    ROS: negative except as noted in the HPI  Medications: Current Outpatient Medications  Medication Sig Dispense Refill  . amLODipine (NORVASC) 10 MG  tablet Take 1 tablet (10 mg total) by mouth daily. 90 tablet 1  . aspirin 81 MG EC tablet Take 1 tablet (81 mg total) by mouth daily. 30 tablet 11  . atorvastatin (LIPITOR) 40 MG tablet Take 1 tablet (40 mg total) by mouth at bedtime. 90 tablet 3  . cetirizine (ZYRTEC) 5 MG tablet Take 1-2 tablets (5-10 mg total) by mouth daily. 30 tablet 0  . FLUoxetine (PROZAC) 20 MG capsule TAKE 1 CAPSULE BY MOUTH ONCE DAILY, NEEDS FOLLOW UP APPOINTMENT FOR REFILLS 90 capsule 1  . fluticasone (FLONASE) 50 MCG/ACT nasal spray USE 2 SPRAY(S) IN EACH NOSTRIL ONCE DAILY 16 g 1  . naproxen (NAPROSYN) 500 MG tablet Take 1 tablet (500 mg total) by mouth 2 (two) times daily with a meal. 60 tablet 2   No current facility-administered medications for this visit.    Allergies  Allergen Reactions  . Codeine Other (See Comments)    Chest pain  . Paroxetine Other (See Comments)    Makes spacey  . Penicillins Diarrhea       Objective:  BP (!) 147/80   Pulse 62   Wt 249 lb (112.9 kg)   BMI  34.73 kg/m  Gen:  alert, not ill-appearing, no distress, appropriate for age, obese male HEENT: head normocephalic without obvious abnormality, conjunctiva and cornea clear, trachea midline Pulm: Normal work of breathing, normal phonation Neuro: alert and oriented x 3, no tremor MSK: extremities atraumatic, normal gait and station Neck: atraumatic, no midline tenderness, normal flexion/extension, decreased external rotation and lateral bend; grip strength 5/5 symmetric Skin: intact, no rashes on exposed skin, no jaundice, no cyanosis Psych: well-groomed, cooperative, good eye contact, euthymic mood, affect mood-congruent, speech is articulate, and thought processes clear and goal-directed    No results found for this or any previous visit (from the past 72 hour(s)). No results found.    Assessment and Plan: 67 y.o. male with   .Lance Anderson was seen today for hypertension.  Diagnoses and all orders for this  visit:  Cervical radiculopathy -     Ambulatory referral to Physical Therapy -     DG Cervical Spine Complete  Hypertension goal BP (blood pressure) < 130/80   HTN: Blood pressure in stage I hypertensive range at home and in office.  He is on the max dose of amlodipine currently.  We discussed that ideally therapeutic lifestyle changes would be better than adding a second medication.  Discussed increased aerobic exercise, calorie restricted-diet, and weight loss.  Patient was amenable to this he will continue to monitor his blood pressure at home.  Continue baby aspirin for primary prevention.  Cervical radiculopathy: No red flag symptoms.  Patient has known degenerative disc disease of his lumbar spine.  This is most likely DDD of the cervical spine with compression of a right spinal cord root.  Continue naproxen 500 mg twice daily as needed. Electrostim x 30 mins tid. Patient has a TENS unit. Referral placed for formal physical therapy.  I would like him to complete PT for a total of 6 weeks.  If symptoms are not improved we will consider MRI of the cervical spine and follow-up with sports medicine.   Patient education and anticipatory guidance given Patient agrees with treatment plan Follow-up in 3 months for HTN or sooner as needed if symptoms worsen or fail to improve  Levonne Hubert PA-C

## 2018-11-04 ENCOUNTER — Ambulatory Visit (INDEPENDENT_AMBULATORY_CARE_PROVIDER_SITE_OTHER): Payer: Medicare Other | Admitting: Physical Therapy

## 2018-11-04 ENCOUNTER — Encounter: Payer: Self-pay | Admitting: Physical Therapy

## 2018-11-04 DIAGNOSIS — M5412 Radiculopathy, cervical region: Secondary | ICD-10-CM

## 2018-11-04 DIAGNOSIS — R293 Abnormal posture: Secondary | ICD-10-CM | POA: Diagnosis not present

## 2018-11-04 NOTE — Patient Instructions (Signed)
Access Code: [email or web address]  URL: [email or web address]  Date: 11/04/2018  Prepared by: Moshe CiproStephanie Tesa Meadors   Exercises  Supine Chin Tuck - 10 reps - 1 sets - 5 sec hold - 2x daily - 7x weekly  Seated Scapular Retraction - 10 reps - 1 sets - 5 sec hold - 2x daily - 7x weekly  Standing Backward Shoulder Rolls - 10 reps - 1 sets - 2x daily - 7x weekly  Patient Education  Trigger Point Dry Needling

## 2018-11-04 NOTE — Therapy (Signed)
[facility] [place] [address] [place] [place], [place], [address] Phone: [phone]   Fax:  [phone]  Physical Therapy Evaluation  Patient Details  Name: Lance Anderson MRN: [id number] Date of Birth: 15-Jul-1951 Referring Provider (PT): Carlis Stable, [place]   Encounter Date: 11/04/2018  PT End of Session - 11/04/18 0836    Visit Number  1    Number of Visits  12    Date for PT Re-Evaluation  12/16/18    PT Start Time  0800    PT Stop Time  0845    PT Time Calculation (min)  45 min    Activity Tolerance  Patient tolerated treatment well    Behavior During Therapy  [facility] for tasks assessed/performed       Past Medical History:  Diagnosis Date  . Abdominal aortic ectasia (HCC) 03/12/2018  . Allergy   . Cataract   . DDD (degenerative disc disease), lumbar   . Depression   . Hyperlipidemia   . Hypertension   . RBBB   . Vasovagal syncope     Past Surgical History:  Procedure Laterality Date  . CATARACT EXTRACTION, BILATERAL    . KNEE ARTHROSCOPY W/ MENISCAL REPAIR Left   . ROTATOR CUFF REPAIR Right   . WISDOM TOOTH EXTRACTION      There were no vitals filed for this visit.   Subjective Assessment - 11/04/18 0802    Subjective  Pt is a 67 y/o male who presents to OPPT for Rt sided neck pain radiating into RUE and hand/fingers.  Pt reported symptoms began in early October with unknown cause of injury.  Pt reports only strenuous activity at that time was lifting kayaks onto roof of car.    Limitations  Lifting    Diagnostic tests  xray: Mild spondylosis of the cervical spine with minimal disc disease at the C5-6 level. Mild right-sided neural foraminal narrowing at the C3-4 and C4-5 levels.    Patient Stated Goals  improve pain    Currently in Pain?  Yes    Pain Score  4    up to 8/10; at best 0/10   Pain Location  Neck    Pain Orientation  Right    Pain Descriptors / Indicators  Aching    Pain Type  Acute  pain    Pain Radiating Towards  RUE    Pain Onset  More than a month ago    Pain Frequency  Intermittent    Aggravating Factors   unknown    Pain Relieving Factors  TENS unit (helping with sleeping), otherwise unknown    Effect of Pain on Daily Activities  wakes pt up at night         [facility] PT Assessment - 11/04/18 0806      Assessment   Medical Diagnosis  M54.12 (ICD-10-CM) - Cervical radiculopathy    Referring Provider (PT)  Carlis Stable, PA-C    Onset Date/Surgical Date  09/22/18    Hand Dominance  Right    Next MD Visit  March 2019; plans to see sports med if no improvement    Prior Therapy  at this clinic for Lt shoulder 4-5 months ago      Precautions   Precautions  None      Restrictions   Weight Bearing Restrictions  No      Balance Screen   Has the patient fallen in the past 6 months  No    Has the patient  had a decrease in activity level because of a fear of falling?   No    Is the patient reluctant to leave their home because of a fear of falling?   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#     Leisure  [place]s, 4 wheeling, kayaking      Cognition   Overall Cognitive Status  Within Functional Limits for tasks assessed      Observation/Other Assessments   Focus on Therapeutic Outcomes (FOTO)   64 (36% limited; predicted 25% limited)      Posture/Postural Control   Posture/Postural Control  Postural limitations    Postural Limitations  Rounded Shoulders;Forward head      ROM / Strength   AROM / PROM / Strength  AROM;Strength      AROM   Overall AROM Comments  rotation measured sitting with goniometer    AROM Assessment Site  Cervical    Cervical Flexion  52    Cervical Extension  45   with pain   Cervical - Right Side Bend  27    Cervical - Left Side Bend  43    Cervical - Right Rotation  54   with pain    Cervical - Left Rotation  68      Strength   Strength Assessment Site  Shoulder    Right/Left Shoulder  Right;Left    Right Shoulder Flexion  5/5    Right Shoulder ABduction  4/5   with pain   Right Shoulder Internal Rotation  5/5    Right Shoulder External Rotation  5/5    Left Shoulder Flexion  5/5    Left Shoulder ABduction  5/5    Left Shoulder Internal Rotation  5/5    Left Shoulder External Rotation  5/5      Palpation   Palpation comment  trigger points noted in Rt upper trap and levator, tightness in pecs bil      Special Tests    Special Tests  Cervical    Cervical Tests  Spurling's;Dictraction      Spurling's   Findings  Positive    Side  Right      Distraction Test   Findngs  Positive                Objective measurements completed on examination: See above findings.      [facility]/Exercise - 11/04/18 0806      Exercises   Exercises  Neck      Neck Exercises: Seated   Shoulder Rolls  Backwards;5 reps    Other Seated Exercise  scap retraction x 5 reps      Neck Exercises: Supine   Neck Retraction  5 reps;5 secs      Modalities   Modalities  Traction      Traction   Type of Traction  Cervical    Min (lbs)  15    Max (lbs)  22    Hold Time  60    Rest Time  20    Time  15             PT Education - 11/04/18 0836    Education Details  HEP, DN    Person(s) Educated  Patient    Methods  Explanation;Demonstration;Handout  Comprehension  Verbalized understanding;Returned demonstration;Need further instruction          PT Long Term Goals - 11/04/18 0840      PT LONG TERM GOAL #1   Title  independent with HEP    Status  New    Target Date  12/16/18      PT LONG TERM GOAL #2   Title  improve FOTO score to </= 25% limited for improved function    Status  New    Target Date  12/16/18      PT LONG TERM GOAL #3   Title  improve rt cervical rotation to at least 65 degrees for improved function and  driving    Status  New    Target Date  12/16/18      PT LONG TERM GOAL #4   Title  report centralization of symptoms for improved pain and function    Status  New    Target Date  12/16/18      PT LONG TERM GOAL #5   Title  n/a             Plan - 11/04/18 0837    Clinical Impression Statement  Pt is a 67 y/o male who presents to OPPT for Rt sided neck pain radiating into RUE.  Pt with positive spurling's and distraction test today so initiated traction to see if pt has improved symptoms.  Pt demonstrates ROM limitations as well as poor postural awareness affecting functional mobility.  Pt will benefit from PT to address deficits listed.    Clinical Presentation  Stable    Clinical Decision Making  Low    Rehab Potential  Good    PT Frequency  2x / week    PT Duration  6 weeks    PT Treatment/Interventions  ADLs/Self Care Home Management;Cryotherapy;Electrical Stimulation;Ultrasound;Traction;Moist Heat;Therapeutic activities;Therapeutic exercise;Patient/family education;Manual techniques;Taping;Dry needling;Passive range of motion    PT Next Visit Plan  review HEP, continue postural exercises, assess response to traction, manual/modalities/DN PRN    PT Home Exercise Plan  Access Code: [email or web address]    Consulted and Agree with Plan of Care  Patient       Patient will benefit from skilled therapeutic intervention in order to improve the following deficits and impairments:  Increased fascial restricitons, Increased muscle spasms, Pain, Postural dysfunction, Decreased range of motion, Impaired flexibility  Visit Diagnosis: Radiculopathy, cervical region - Plan: PT plan of care cert/re-cert  Abnormal posture - Plan: PT plan of care cert/re-cert     Problem List Patient Active Problem List   Diagnosis Date Noted  . Cervical radiculopathy 10/30/2018  . Elevated blood pressure reading in office with diagnosis of hypertension 05/29/2018  . Right bundle branch block (RBBB) 05/29/2018   . Sinus bradycardia 05/29/2018  . Peripheral edema 04/27/2018  . Venous stasis dermatitis of right lower extremity 04/27/2018  . Strain of calf muscle 04/27/2018  . Abdominal aortic ectasia (HCC) 03/12/2018  . 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 Recurrent major depressive disorder, in full remission (HCC) 02/25/2018  . Encounter for long-term (current) use of medications 02/25/2018  . Encounter for abdominal aortic aneurysm (AAA) screening 02/25/2018  . Dyslipidemia, goal LDL below 100 02/25/2018  . Refused influenza vaccine 02/25/2018  . Refused pneumococcal vaccination 02/25/2018  . Pruritus 08/04/2017  . Encounter for monitoring statin therapy 08/04/2017  . Depression 05/21/2017  . Family history of heart disease in male family member before age 41 05/21/2017  . Tear of meniscus of knee  05/21/2017  . Class 1 obesity due to excess calories with serious comorbidity in adult 05/21/2017  . Allergic rhinitis 05/21/2017  . DDD (degenerative disc disease), lumbosacral 05/21/2017  . Vasovagal episode 05/21/2017  . Hypertension goal BP (blood pressure) < 130/80 08/17/2015  . Pseudophakia of both eyes 10/20/2013  . Dyslipidemia 08/10/2013      Clarita Crane, PT, DPT 11/04/18 8:48 AM    [facility] [address] [place], [place], [address] Phone: [phone]   Fax:  [phone]  Name: Lance Anderson MRN: [id number] Date of Birth: February 11, 1951

## 2018-11-09 ENCOUNTER — Ambulatory Visit (INDEPENDENT_AMBULATORY_CARE_PROVIDER_SITE_OTHER): Payer: Medicare Other | Admitting: Physical Therapy

## 2018-11-09 ENCOUNTER — Encounter: Payer: Self-pay | Admitting: Physical Therapy

## 2018-11-09 VITALS — BP 136/78 | HR 57

## 2018-11-09 DIAGNOSIS — R293 Abnormal posture: Secondary | ICD-10-CM | POA: Diagnosis not present

## 2018-11-09 DIAGNOSIS — M5412 Radiculopathy, cervical region: Secondary | ICD-10-CM | POA: Diagnosis not present

## 2018-11-09 NOTE — Therapy (Signed)
[facility] [place] [address] [place], [place], [address] Phone: [phone]   Fax:  [phone]  Physical Therapy Treatment  Patient Details  Name: Lance Anderson MRN: [id number] Date of Birth: [birth year] Referring Provider (PT): Carlis Stable, [place]   Encounter Date: 11/09/2018  PT End of Session - 11/09/18 0715    Visit Number  2    Number of Visits  12    Date for PT Re-Evaluation  12/16/18    PT Start Time  0715    PT Stop Time  0815    PT Time Calculation (min)  60 min    Activity Tolerance  Patient tolerated treatment well    Behavior During Therapy  [facility] for tasks assessed/performed       Past Medical History:  Diagnosis Date  . Abdominal aortic ectasia (HCC) 03/12/2018  . Allergy   . Cataract   . DDD (degenerative disc disease), lumbar   . Depression   . Hyperlipidemia   . Hypertension   . RBBB   . Vasovagal syncope     Past Surgical History:  Procedure Laterality Date  . CATARACT EXTRACTION, BILATERAL    . KNEE ARTHROSCOPY W/ MENISCAL REPAIR Left   . ROTATOR CUFF REPAIR Right   . WISDOM TOOTH EXTRACTION      Vitals:   11/09/18 0717  BP: 136/78  Pulse: (!) 57    Subjective Assessment - 11/09/18 0717    Subjective  Pt reports certain times and certain things stir things up.  This morning his pain is not that bad.  Driving his truck and sleeping (hard time getting comfortable) is still troublesome.     Diagnostic tests  xray: Mild spondylosis of the cervical spine with minimal disc disease at the C5-6 level. Mild right-sided neural foraminal narrowing at the C3-4 and C4-5 levels.    Currently in Pain?  Yes    Pain Score  1     Pain Location  Neck    Pain Orientation  Right    Pain Descriptors / Indicators  Aching    Aggravating Factors   see above    Pain Relieving Factors  TENS         [facility] PT Assessment - 11/09/18 0001      Assessment   Medical Diagnosis  M54.12 (ICD-10-CM)  - Cervical radiculopathy    Referring Provider (PT)  Carlis Stable, PA-C    Onset Date/Surgical Date  09/22/18    Hand Dominance  Right    Next MD Visit  March 2019; plans to see sports med if no improvement    Prior Therapy  at this clinic for Lt shoulder 4-5 months ago       [facility] Adult PT Treatment/Exercise - 11/09/18 0001      Neck Exercises: Machines for Strengthening   UBE (Upper Arm Bike)  L1: 1.5 min each direction       Neck Exercises: Standing   Other Standing Exercises  L's x 5 sec x 10 reps; W's x 5 sec x 10 reps       Neck Exercises: Seated   Other Seated Exercise  thoracic ext over back of chair x 5 reps       Neck Exercises: Supine   Neck Retraction  5 reps;5 secs    Neck Retraction Limitations  cues on improved form    Other Supine Exercise  prolonged stretch with arms abct 90 deg and 110 deg, then 8 snow angels  to tolerance     Other Supine Exercise  scap retraction x 5 sec x 10 reps       Modalities   Modalities  Electrical Stimulation;Moist Heat;Traction      Moist Heat Therapy   Number Minutes Moist Heat  19 Minutes   during traction   Moist Heat Location  Shoulder;Cervical      Electrical Stimulation   Electrical Stimulation Location  Rt upper trap, levator, midtrap, scalene    Electrical Stimulation Action  IFC     Electrical Stimulation Parameters  to tolerance x 19 min     Electrical Stimulation Goals  Pain      Traction   Type of Traction  Cervical    Min (lbs)  15    Max (lbs)  22    Hold Time  60    Rest Time  20    Time  19      Manual Therapy   Manual Therapy  Soft tissue mobilization;Myofascial release    Soft tissue mobilization  STM to Rt upper  trap, scalene, levator, pec major/minor;  pin and stretch to Rt pec and Rt scalenes. TPR to Rt levator    Myofascial Release  Rt pec, scalene      Neck Exercises: Stretches   Other Neck Stretches  3-position doorway stretch x 30 sec x 2 reps each position - unilateral               PT Education - 11/09/18 0803    Education Details  HEP    Person(s) Educated  Patient    Methods  Explanation;Verbal cues;Handout    Comprehension  Verbalized understanding;Returned demonstration          PT Long Term Goals - 11/04/18 0840      PT LONG TERM GOAL #1   Title  independent with HEP    Status  New    Target Date  12/16/18      PT LONG TERM GOAL #2   Title  improve FOTO score to </= 25% limited for improved function    Status  New    Target Date  12/16/18      PT LONG TERM GOAL #3   Title  improve rt cervical rotation to at least 65 degrees for improved function and driving    Status  New    Target Date  12/16/18      PT LONG TERM GOAL #4   Title  report centralization of symptoms for improved pain and function    Status  New    Target Date  12/16/18      PT LONG TERM GOAL #5   Title  n/a            Plan - 11/09/18 0727    Clinical Impression Statement  Pt became light headed and diaphoretic while completing doorway stretch. Pt sat down, was still light headed, so was placed in supine with legs elevated. BP WNL and symptoms resolved. No further symptoms like this remainder of treatment.  After time in supine, Pt tolerated new exercises well.  Tight Rt pec minor and scalenes noted during manual therapy; may benefit from continued manual therapy / DN in future sessions.      Rehab Potential  Good    PT Frequency  2x / week    PT Duration  6 weeks    PT Treatment/Interventions  ADLs/Self Care Home Management;Cryotherapy;Electrical Stimulation;Ultrasound;Traction;Moist Heat;Therapeutic activities;Therapeutic exercise;Patient/family education;Manual techniques;Taping;Dry needling;Passive range of motion  PT Next Visit Plan  review HEP, continue postural exercises, assess response to traction, manual/modalities/DN PRN       Patient will benefit from skilled therapeutic intervention in order to improve the following deficits and  impairments:  Increased fascial restricitons, Increased muscle spasms, Pain, Postural dysfunction, Decreased range of motion, Impaired flexibility  Visit Diagnosis: Radiculopathy, cervical region  Abnormal posture     Problem List Patient Active Problem List   Diagnosis Date Noted  . Cervical radiculopathy 10/30/2018  . Elevated blood pressure reading in office with diagnosis of hypertension 05/29/2018  . Right bundle branch block (RBBB) 05/29/2018  . Sinus bradycardia 05/29/2018  . Peripheral edema 04/27/2018  . Venous stasis dermatitis of right lower extremity 04/27/2018  . Strain of calf muscle 04/27/2018  . Abdominal aortic ectasia (HCC) 03/12/2018  . 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 Recurrent major depressive disorder, in full remission (HCC) 02/25/2018  . Encounter for long-term (current) use of medications 02/25/2018  . Encounter for abdominal aortic aneurysm (AAA) screening 02/25/2018  . Dyslipidemia, goal LDL below 100 02/25/2018  . Refused influenza vaccine 02/25/2018  . Refused pneumococcal vaccination 02/25/2018  . Pruritus 08/04/2017  . Encounter for monitoring statin therapy 08/04/2017  . Depression 05/21/2017  . Family history of heart disease in male family member before age 6 05/21/2017  . Tear of meniscus of knee 05/21/2017  . Class 1 obesity due to excess calories with serious comorbidity in adult 05/21/2017  . Allergic rhinitis 05/21/2017  . DDD (degenerative disc disease), lumbosacral 05/21/2017  . Vasovagal episode 05/21/2017  . Hypertension goal BP (blood pressure) < 130/80 08/17/2015  . Pseudophakia of both eyes 10/20/2013  . Dyslipidemia 08/10/2013   Mayer Camel, PTA 11/09/18 5:08 PM  [facility] [place] [address] [place], [place], [address] Phone: [phone]   Fax:  [phone]  Name: Lance Anderson MRN: [id number] Date of Birth: 1951-01-23

## 2018-11-09 NOTE — Patient Instructions (Signed)
   Upper Back Strength: Lower Trapezius / Rotator Cuff " L's "     Arms in waitress pose, palms up. Press hands back and slide shoulder blades down. Hold for __5__ seconds. Repeat _10___ times. 1-2 times per day.    Scapular Retraction: Elbow Flexion (Standing)  "W's"     With elbows bent to 90, pinch shoulder blades together and rotate arms out, keeping elbows bent. Repeat __10__ times per set. Do __1-2__ sets per session. Do _several ___ sessions per day.  Thoracic: Stretch - Lean Back (Chair)    Get ON TARGET. Sit on a low firm-backed chair, hands behind head. Elbows leading motion, lean back, arching upper body. Avoid undue pressure or quick stretching. Hold _5__ seconds. Repeat __3_ times. Do _2__ sessions per day.    [facility] [address] [place] [address] [place], [place] [address]  [phone] (office) [phone] (fax)

## 2018-11-11 ENCOUNTER — Ambulatory Visit (INDEPENDENT_AMBULATORY_CARE_PROVIDER_SITE_OTHER): Payer: Medicare Other | Admitting: Physical Therapy

## 2018-11-11 ENCOUNTER — Encounter: Payer: Self-pay | Admitting: Physical Therapy

## 2018-11-11 DIAGNOSIS — M5412 Radiculopathy, cervical region: Secondary | ICD-10-CM

## 2018-11-11 DIAGNOSIS — R293 Abnormal posture: Secondary | ICD-10-CM

## 2018-11-11 NOTE — Therapy (Signed)
[place] [employer] [place], [place], [address] Phone: [phone]   Fax:  [phone]  Physical Therapy Treatment  Patient Details  Name: Lance Anderson MRN: [id number] Date of Birth: 1951/03/17 Referring Provider (PT): Trixie Dredge, [place]   Encounter Date: 11/11/2018  PT End of Session - 11/11/18 0841    Visit Number  3    Number of Visits  12    Date for PT Re-Evaluation  12/16/18    PT Start Time  0801    PT Stop Time  2952    PT Time Calculation (min)  54 min    Activity Tolerance  Patient tolerated treatment well    Behavior During Therapy  [facility] for tasks assessed/performed       Past Medical History:  Diagnosis Date  . Abdominal aortic ectasia ([place]) 03/12/2018  . Allergy   . Cataract   . DDD (degenerative disc disease), lumbar   . Depression   . Hyperlipidemia   . Hypertension   . RBBB   . Vasovagal syncope     Past Surgical History:  Procedure Laterality Date  . CATARACT EXTRACTION, BILATERAL    . KNEE ARTHROSCOPY W/ MENISCAL REPAIR Left   . ROTATOR CUFF REPAIR Right   . WISDOM TOOTH EXTRACTION      There were no vitals filed for this visit.  Subjective Assessment - 11/11/18 0802    Subjective  "it's a little sore this morning."  normally doesn't have pain in the AM, but does today.  no significant difference with traction.    Diagnostic tests  xray: Mild spondylosis of the cervical spine with minimal disc disease at the C5-6 level. Mild right-sided neural foraminal narrowing at the C3-4 and C4-5 levels.    Patient Stated Goals  improve pain    Currently in Pain?  Yes    Pain Score  2     Pain Location  Neck    Pain Orientation  Right    Pain Descriptors / Indicators  Aching    Pain Type  Acute pain    Pain Onset  More than a month ago    Pain Frequency  Intermittent    Aggravating Factors   not sure    Pain Relieving Factors  TENS                        [facility] Adult PT Treatment/Exercise - 11/11/18 0803      Exercises   Exercises  Neck      Neck Exercises: Machines for Strengthening   UBE (Upper Arm Bike)  L3: 2 min each direction       Neck Exercises: Theraband   Shoulder External Rotation  15 reps   yellow; on foam roll   Horizontal ABduction  15 reps   yellow; on foam roll   Other Theraband Exercises  supine overhead pull x 15 reps; on foam roll; yellow      Moist Heat Therapy   Number Minutes Moist Heat  15 Minutes    Moist Heat Location  Shoulder;Cervical      Electrical Stimulation   Electrical Stimulation Location  Rt upper trap, levator, midtrap, scalene    Electrical Stimulation Action  IFC    Electrical Stimulation Parameters  to tolerance x 15 min    Electrical Stimulation Goals  Pain      Manual Therapy   Manual Therapy  Soft tissue mobilization    Manual therapy  comments  skilled palpation and monitoring of soft tissue during DN; pt prone and supine    Soft tissue mobilization  STM to Rt upper  trap, scalene, levator, pec major/minor;  pin and stretch to Rt pec and Rt scalenes. TPR to Rt levator      Neck Exercises: Stretches   Other Neck Stretches  supine on foam roll x 5 min for chest stretch       Trigger Point Dry Needling - 11/11/18 0841    Consent Given?  Yes    Education Handout Provided  Yes    Muscles Treated Upper Body  Upper trapezius    Upper Trapezius Response  Twitch reponse elicited;Palpable increased muscle length                PT Long Term Goals - 11/04/18 0840      PT LONG TERM GOAL #1   Title  independent with HEP    Status  New    Target Date  12/16/18      PT LONG TERM GOAL #2   Title  improve FOTO score to </= 25% limited for improved function    Status  New    Target Date  12/16/18      PT LONG TERM GOAL #3   Title  improve rt cervical rotation to at least 65 degrees for improved function and driving    Status  New    Target  Date  12/16/18      PT LONG TERM GOAL #4   Title  report centralization of symptoms for improved pain and function    Status  New    Target Date  12/16/18      PT LONG TERM GOAL #5   Title  n/a            Plan - 11/11/18 1015    Clinical Impression Statement  Pt reports minimal change in symptoms since initiating PT and no difference following traction last session.  Trialed DN and manual therapy today to see if pt has benefit.  No goals met due to limited progress, but will continue to benefit from PT to work towards improvement of symptoms.    Rehab Potential  Good    PT Frequency  2x / week    PT Duration  6 weeks    PT Treatment/Interventions  ADLs/Self Care Home Management;Cryotherapy;Electrical Stimulation;Ultrasound;Traction;Moist Heat;Therapeutic activities;Therapeutic exercise;Patient/family education;Manual techniques;Taping;Dry needling;Passive range of motion    PT Next Visit Plan  review HEP, continue postural exercises, assess response to traction, manual/modalities/DN PRN    PT Home Exercise Plan  Access Code: [email or web address]    Consulted and Agree with Plan of Care  Patient       Patient will benefit from skilled therapeutic intervention in order to improve the following deficits and impairments:  Increased fascial restricitons, Increased muscle spasms, Pain, Postural dysfunction, Decreased range of motion, Impaired flexibility  Visit Diagnosis: Radiculopathy, cervical region  Abnormal posture     Problem List Patient Active Problem List   Diagnosis Date Noted  . Cervical radiculopathy 10/30/2018  . Elevated blood pressure reading in office with diagnosis of hypertension 05/29/2018  . Right bundle branch block (RBBB) 05/29/2018  . Sinus bradycardia 05/29/2018  . Peripheral edema 04/27/2018  . Venous stasis dermatitis of right lower extremity 04/27/2018  . Strain of calf muscle 04/27/2018  . Abdominal aortic ectasia ([place]) 03/12/2018  . Vasculogenic erectile  dysfunction 03/03/2018  . Recurrent major depressive disorder, in full remission ([place])  02/25/2018  . Encounter for long-term (current) use of medications 02/25/2018  . Encounter for abdominal aortic aneurysm (AAA) screening 02/25/2018  . Dyslipidemia, goal LDL below 100 02/25/2018  . Refused influenza vaccine 02/25/2018  . Refused pneumococcal vaccination 02/25/2018  . Pruritus 08/04/2017  . Encounter for monitoring statin therapy 08/04/2017  . Depression 05/21/2017  . Family history of heart disease in male family member before age 74 05/21/2017  . Tear of meniscus of knee 05/21/2017  . Class 1 obesity due to excess calories with serious comorbidity in adult 05/21/2017  . Allergic rhinitis 05/21/2017  . DDD (degenerative disc disease), lumbosacral 05/21/2017  . Vasovagal episode 05/21/2017  . Hypertension goal BP (blood pressure) < 130/80 08/17/2015  . Pseudophakia of both eyes 10/20/2013  . Dyslipidemia 08/10/2013      Laureen Abrahams, PT, DPT 11/11/18 10:17 AM    [facility] [place], [place], [address] Phone: [phone]   Fax:  [phone]  Name: Lance Anderson MRN: [id number] Date of Birth: [birth year]

## 2018-11-16 ENCOUNTER — Encounter: Payer: Self-pay | Admitting: Physical Therapy

## 2018-11-16 ENCOUNTER — Ambulatory Visit (INDEPENDENT_AMBULATORY_CARE_PROVIDER_SITE_OTHER): Payer: Medicare Other | Admitting: Physical Therapy

## 2018-11-16 DIAGNOSIS — R293 Abnormal posture: Secondary | ICD-10-CM

## 2018-11-16 DIAGNOSIS — M5412 Radiculopathy, cervical region: Secondary | ICD-10-CM | POA: Diagnosis not present

## 2018-11-16 NOTE — Therapy (Signed)
[facility] [place] [address] [place], [place], [address] Phone: [phone]   Fax:  [phone]  Physical Therapy Treatment  Patient Details  Name: Lance Anderson MRN: [id number] Date of Birth: 14-Aug-1951 Referring Provider (PT): Carlis Stable, [place]   Encounter Date: 11/16/2018  PT End of Session - 11/16/18 0754    Visit Number  4    Number of Visits  12    Date for PT Re-Evaluation  12/16/18    PT Start Time  0714    PT Stop Time  0753    PT Time Calculation (min)  39 min    Activity Tolerance  Patient tolerated treatment well    Behavior During Therapy  [facility] for tasks assessed/performed       Past Medical History:  Diagnosis Date  . Abdominal aortic ectasia ([facility]) 03/12/2018  . Allergy   . Cataract   . DDD (degenerative disc disease), lumbar   . Depression   . Hyperlipidemia   . Hypertension   . RBBB   . Vasovagal syncope     Past Surgical History:  Procedure Laterality Date  . CATARACT EXTRACTION, BILATERAL    . KNEE ARTHROSCOPY W/ MENISCAL REPAIR Left   . ROTATOR CUFF REPAIR Right   . WISDOM TOOTH EXTRACTION      There were no vitals filed for this visit.  Subjective Assessment - 11/16/18 0716    Subjective  had a lot of pain and tingling yesterday.  spent time putting up christmas lights, and today "it feels pretty good so far."    Diagnostic tests  xray: Mild spondylosis of the cervical spine with minimal disc disease at the C5-6 level. Mild right-sided neural foraminal narrowing at the C3-4 and C4-5 levels.    Patient Stated Goals  improve pain    Currently in Pain?  No/denies    Pain Onset  More than a month ago         [facility] PT Assessment - 11/16/18 0719      Assessment   Medical Diagnosis  M54.12 (ICD-10-CM) - Cervical radiculopathy    Referring Provider (PT)  Carlis Stable, PA-C    Onset Date/Surgical Date  09/22/18    Hand Dominance  Right    Next MD Visit   March 2019; plans to see sports med if no improvement    Prior Therapy  at this clinic for Lt shoulder 4-5 months ago                   [facility] Adult PT Treatment/Exercise - 11/16/18 0716      Exercises   Exercises  Neck      Neck Exercises: Machines for Strengthening   UBE (Upper Arm Bike)  L3: 2 min each direction       Neck Exercises: Theraband   Shoulder Extension  15 reps;Green   5 sec hold   Rows  15 reps;Green   5 sec   Horizontal ABduction  15 reps;Green   standing with noodle     Neck Exercises: Supine   Neck Retraction  10 reps;5 secs      Neck Exercises: Prone   Neck Retraction  10 reps;5 secs    W Back  15 reps   5 sec     Manual Therapy   Soft tissue mobilization  STM to Rt upper  trap, scalene, levator, pec major/minor;  pin and stretch to Rt pec and Rt scalenes. TPR to Rt levator  Neck Exercises: Stretches   Upper Trapezius Stretch  Right;2 reps;30 seconds    Levator Stretch  Right;2 reps;30 seconds    Other Neck Stretches  3-position doorway stretch x 30 sec x 2 reps each position                  PT Long Term Goals - 11/04/18 0840      PT LONG TERM GOAL #1   Title  independent with HEP    Status  New    Target Date  12/16/18      PT LONG TERM GOAL #2   Title  improve FOTO score to </= 25% limited for improved function    Status  New    Target Date  12/16/18      PT LONG TERM GOAL #3   Title  improve rt cervical rotation to at least 65 degrees for improved function and driving    Status  New    Target Date  12/16/18      PT LONG TERM GOAL #4   Title  report centralization of symptoms for improved pain and function    Status  New    Target Date  12/16/18      PT LONG TERM GOAL #5   Title  n/a            Plan - 11/16/18 0754    Clinical Impression Statement  Pt tolerated session well today with mild increase in symptoms with standing exercises, but resolved with supine retraction.  No symptoms today, but  continues to have variable days of symptoms.    Rehab Potential  Good    PT Frequency  2x / week    PT Duration  6 weeks    PT Treatment/Interventions  ADLs/Self Care Home Management;Cryotherapy;Electrical Stimulation;Ultrasound;Traction;Moist Heat;Therapeutic activities;Therapeutic exercise;Patient/family education;Manual techniques;Taping;Dry needling;Passive range of motion    PT Next Visit Plan  review HEP, continue postural exercises, manual/modalities/DN PRN    PT Home Exercise Plan  Access Code: [email or web address]    Consulted and Agree with Plan of Care  Patient       Patient will benefit from skilled therapeutic intervention in order to improve the following deficits and impairments:  Increased fascial restricitons, Increased muscle spasms, Pain, Postural dysfunction, Decreased range of motion, Impaired flexibility  Visit Diagnosis: Radiculopathy, cervical region  Abnormal posture     Problem List Patient Active Problem List   Diagnosis Date Noted  . Cervical radiculopathy 10/30/2018  . Elevated blood pressure reading in office with diagnosis of hypertension 05/29/2018  . Right bundle branch block (RBBB) 05/29/2018  . Sinus bradycardia 05/29/2018  . Peripheral edema 04/27/2018  . Venous stasis dermatitis of right lower extremity 04/27/2018  . Strain of calf muscle 04/27/2018  . Abdominal aortic ectasia ([facility]) 03/12/2018  . 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 Recurrent major depressive disorder, in full remission ([facility]) 02/25/2018  . Encounter for long-term (current) use of medications 02/25/2018  . Encounter for abdominal aortic aneurysm (AAA) screening 02/25/2018  . Dyslipidemia, goal LDL below 100 02/25/2018  . Refused influenza vaccine 02/25/2018  . Refused pneumococcal vaccination 02/25/2018  . Pruritus 08/04/2017  . Encounter for monitoring statin therapy 08/04/2017  . Depression 05/21/2017  . Family history of heart disease in male family member before age 255  05/21/2017  . Tear of meniscus of knee 05/21/2017  . Class 1 obesity due to excess calories with serious comorbidity in adult 05/21/2017  . Allergic rhinitis 05/21/2017  . DDD (degenerative  disc disease), lumbosacral 05/21/2017  . Vasovagal episode 05/21/2017  . Hypertension goal BP (blood pressure) < 130/80 08/17/2015  . Pseudophakia of both eyes 10/20/2013  . Dyslipidemia 08/10/2013      Clarita Crane, PT, DPT 11/16/18 7:56 AM     [facility] [address] [place] [address] [place], [place], [address] Phone: [phone]   Fax:  [phone]  Name: Layth Cerezo MRN: [id number] Date of Birth: 02/26/1951

## 2018-11-18 ENCOUNTER — Encounter: Payer: Self-pay | Admitting: Physical Therapy

## 2018-11-18 ENCOUNTER — Ambulatory Visit (INDEPENDENT_AMBULATORY_CARE_PROVIDER_SITE_OTHER): Payer: Medicare Other | Admitting: Physical Therapy

## 2018-11-18 DIAGNOSIS — R293 Abnormal posture: Secondary | ICD-10-CM | POA: Diagnosis not present

## 2018-11-18 DIAGNOSIS — M5412 Radiculopathy, cervical region: Secondary | ICD-10-CM | POA: Diagnosis not present

## 2018-11-18 NOTE — Therapy (Signed)
[facility] [place] [address] [place], [place], [address] Phone: [phone]   Fax:  [phone]  Physical Therapy Treatment  Patient Details  Name: Lance Anderson MRN: [id number] Date of Birth: 06/16/1951 Referring Provider (PT): Carlis Stable, [place]   Encounter Date: 11/18/2018  PT End of Session - 11/18/18 0804    Visit Number  5    Number of Visits  12    Date for PT Re-Evaluation  12/16/18    PT Start Time  0717    PT Stop Time  0813   MHP/ice pack last 12 min    PT Time Calculation (min)  56 min    Behavior During Therapy  [facility] for tasks assessed/performed       Past Medical History:  Diagnosis Date  . Abdominal aortic ectasia ([facility]) 03/12/2018  . Allergy   . Cataract   . DDD (degenerative disc disease), lumbar   . Depression   . Hyperlipidemia   . Hypertension   . RBBB   . Vasovagal syncope     Past Surgical History:  Procedure Laterality Date  . CATARACT EXTRACTION, BILATERAL    . KNEE ARTHROSCOPY W/ MENISCAL REPAIR Left   . ROTATOR CUFF REPAIR Right   . WISDOM TOOTH EXTRACTION      There were no vitals filed for this visit.  Subjective Assessment - 11/18/18 0721    Subjective  "The tingling seems to be getting worse".  Pt reports that his symptoms "have no rhyme or reason".   He notices he is most aware of his symptoms when he lays down at night.  To note: he is a stomach sleeper with head turned to Lt and Rt arm up under head.   Currently in Pain?  Yes    Pain Score  3     Pain Location  Neck    Pain Orientation  Right    Pain Radiating Towards  into Rt fingers     Aggravating Factors   not sure    Pain Relieving Factors  TENS         [facility] PT Assessment - 11/18/18 0001      Assessment   Medical Diagnosis  M54.12 (ICD-10-CM) - Cervical radiculopathy    Referring Provider (PT)  Carlis Stable, PA-C    Onset Date/Surgical Date  09/22/18    Hand Dominance  Right    Next MD Visit  March 2019; plans to see sports med if no improvement    Prior Therapy  at this clinic for Lt shoulder 4-5 months ago       [facility] Adult PT Treatment/Exercise - 11/18/18 0001      Exercises   Exercises  Neck      Neck Exercises: Standing   Neck Retraction  5 reps    Other Standing Exercises  L's x 5 sec x 10 reps; W's x 5 sec x 5 reps ( increased tingling in Rt thumb wiht W's - stopped)    Other Standing Exercises  Nerve glides and flossing for ulnar and radial nerve - 4 different positions (per HEP) including palms together, moving arms side to side x 8      Modalities   Modalities  Moist Heat;Cryotherapy;Electrical Stimulation;Ultrasound      Moist Heat Therapy   Number Minutes Moist Heat  12 Minutes    Moist Heat Location  --   Rt tricep, end of session     Cryotherapy   Number Minutes Cryotherapy  12 Minutes    Cryotherapy Location  Cervical   Rt; at end of session   Type of Cryotherapy  Ice pack      Electrical Stimulation   Electrical Stimulation Location  Rt upper trap, scalenes, cervical paraspinals     Electrical Stimulation Action  combo US     Electrical Stimulation Parameters  to tolerance     Electrical Stimulation Goals  Pain;Tone      Ultrasound   Ultrasound Location  see estim    Ultrasound Parameters  COMBO US:  100%, 1.3 w/cm2, 8 min     Ultrasound Goals  Pain   tightness     Manual Therapy   Soft tissue mobilization  IASTM to Rt upper  trap, scalene, levator, cervical paraspinals, mid trap, Rt posterior shoulder and tricep (deep back arm line) to decrease fascial tightness.(pt sitting in chair)      Neck Exercises: Stretches   Upper Trapezius Stretch  Left;3 reps;10 seconds    Other Neck Stretches  3-position doorway stretch x 15-20 sec x 2 reps each position-  bilat bicep stretch hold door frame x 20 sec x 3 reps                   PT Long Term Goals - 11/04/18 0840      PT LONG TERM GOAL #1   Title  independent with HEP     Status  New    Target Date  12/16/18      PT LONG TERM GOAL #2   Title  improve FOTO score to </= 25% limited for improved function    Status  New    Target Date  12/16/18      PT LONG TERM GOAL #3   Title  improve rt cervical rotation to at least 65 degrees for improved function and driving    Status  New    Target Date  12/16/18      PT LONG TERM GOAL #4   Title  report centralization of symptoms for improved pain and function    Status  New    Target Date  12/16/18      PT LONG TERM GOAL #5   Title  n/a            Plan - 11/18/18 0802    Clinical Impression Statement  Introduced nerve glides to pt today; tolerated well.  Educated pt on avoiding agrivating factors like stomach sleeping and sitting with arm propped too high.  Symptoms into RUE varied today during treatment.  Goals continue to be ongoing at this time.     Rehab Potential  Good    PT Frequency  2x / week    PT Duration  6 weeks    PT Next Visit Plan  review HEP, continue postural exercises, manual/modalities/DN PRN    PT Home Exercise Plan  Access Code: [email or web address]  and [email or web address]       Patient will benefit from skilled therapeutic intervention in order to improve the following deficits and impairments:  Increased fascial restricitons, Increased muscle spasms, Pain, Postural dysfunction, Decreased range of motion, Impaired flexibility  Visit Diagnosis: Radiculopathy, cervical region  Abnormal posture     Problem List Patient Active Problem List   Diagnosis Date Noted  . Cervical radiculopathy 10/30/2018  . Elevated blood pressure reading in office with diagnosis of hypertension 05/29/2018  . Right bundle branch block (RBBB) 05/29/2018  . Sinus bradycardia 05/29/2018  . Peripheral edema 04/27/2018  .  Venous stasis dermatitis of right lower extremity 04/27/2018  . Strain of calf muscle 04/27/2018  . Abdominal aortic ectasia ([facility]) 03/12/2018  . 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  Recurrent major depressive disorder, in full remission ([facility]) 02/25/2018  . Encounter for long-term (current) use of medications 02/25/2018  . Encounter for abdominal aortic aneurysm (AAA) screening 02/25/2018  . Dyslipidemia, goal LDL below 100 02/25/2018  . Refused influenza vaccine 02/25/2018  . Refused pneumococcal vaccination 02/25/2018  . Pruritus 08/04/2017  . Encounter for monitoring statin therapy 08/04/2017  . Depression 05/21/2017  . Family history of heart disease in male family member before age 65 05/21/2017  . Tear of meniscus of knee 05/21/2017  . Class 1 obesity due to excess calories with serious comorbidity in adult 05/21/2017  . Allergic rhinitis 05/21/2017  . DDD (degenerative disc disease), lumbosacral 05/21/2017  . Vasovagal episode 05/21/2017  . Hypertension goal BP (blood pressure) < 130/80 08/17/2015  . Pseudophakia of both eyes 10/20/2013  . Dyslipidemia 08/10/2013   Mayer Camel, PTA 11/18/18 9:59 AM  [facility] [address] [place] [address] [place], [place], [address] Phone: [phone]   Fax:  [phone]  Name: Kyree Adriano MRN: [id number] Date of Birth: [birth year]

## 2018-11-25 ENCOUNTER — Encounter: Payer: Self-pay | Admitting: Physical Therapy

## 2018-11-25 ENCOUNTER — Ambulatory Visit (INDEPENDENT_AMBULATORY_CARE_PROVIDER_SITE_OTHER): Payer: Medicare Other | Admitting: Physical Therapy

## 2018-11-25 DIAGNOSIS — M5412 Radiculopathy, cervical region: Secondary | ICD-10-CM

## 2018-11-25 DIAGNOSIS — R293 Abnormal posture: Secondary | ICD-10-CM

## 2018-11-25 NOTE — Therapy (Signed)
[facility] [place] [address] [place], [place], [address] Phone: [phone]   Fax:  [phone]  Physical Therapy Treatment  Patient Details  Name: Lance Anderson MRN: [id number] Date of Birth: 03-Feb-1951 Referring Provider (PT): Carlis Stable, [place]   Encounter Date: 11/25/2018  PT End of Session - 11/25/18 0723    Visit Number  6    Number of Visits  12    Date for PT Re-Evaluation  12/16/18    PT Start Time  0718    PT Stop Time  0811    PT Time Calculation (min)  53 min    Activity Tolerance  Patient tolerated treatment well    Behavior During Therapy  [facility] for tasks assessed/performed       Past Medical History:  Diagnosis Date  . Abdominal aortic ectasia (HCC) 03/12/2018  . Allergy   . Cataract   . DDD (degenerative disc disease), lumbar   . Depression   . Hyperlipidemia   . Hypertension   . RBBB   . Vasovagal syncope     Past Surgical History:  Procedure Laterality Date  . CATARACT EXTRACTION, BILATERAL    . KNEE ARTHROSCOPY W/ MENISCAL REPAIR Left   . ROTATOR CUFF REPAIR Right   . WISDOM TOOTH EXTRACTION      There were no vitals filed for this visit.  Subjective Assessment - 11/25/18 0723    Subjective  Pt reports the pain in neck is much less, but the tingling has gotten worse.  His arms feel tired from running chain saw last 2 days. He has tried nerve glides over last week. He has been working on not sleeping on his stomach with arms overhead.     Diagnostic tests  xray: Mild spondylosis of the cervical spine with minimal disc disease at the C5-6 level. Mild right-sided neural foraminal narrowing at the C3-4 and C4-5 levels.    Patient Stated Goals  improve pain    Currently in Pain?  No/denies    Pain Score  0-No pain         [facility] PT Assessment - 11/25/18 0001      Assessment   Medical Diagnosis  M54.12 (ICD-10-CM) - Cervical radiculopathy    Referring Provider (PT)  Carlis Stable, PA-C    Onset Date/Surgical Date  09/22/18    Hand Dominance  Right    Next MD Visit  March 2019; plans to see sports med if no improvement    Prior Therapy  at this clinic for Lt shoulder 4-5 months ago       [facility] Adult PT Treatment/Exercise - 11/25/18 0001      Neck Exercises: Standing   Other Standing Exercises  Rt ulnar nerve glide with wrist ext and palm/thumb on side of face, hand upside down, 10 reps, 1.5 sets.  ulnar nerve flossing (per HEP) x 10 reps (initially increased symptoms, but symptom free after DN)       Moist Heat Therapy   Number Minutes Moist Heat  15 Minutes    Moist Heat Location  Cervical;Shoulder   Rt     Electrical Stimulation   Electrical Stimulation Location  Rt upper trap, scalenes, cervical paraspinals     Electrical Stimulation Action  IFC    Electrical Stimulation Parameters  to pt tolerance    Electrical Stimulation Goals  Pain;Tone      Manual Therapy   Manual therapy comments  skilled palpation and monitoring of soft tissue  during DN    Soft tissue mobilization  Rt cervical paraspinals, upper trap, levator including suboccipital release      Neck Exercises: Stretches   Upper Trapezius Stretch  Right;3 reps;20 seconds    Levator Stretch  Right;3 reps;20 seconds    Other Neck Stretches  3-position doorway stretch x 20 sec x 2 reps each position-  bilat bicep stretch holding door frame x 20 sec x 3 reps        Trigger Point Dry Needling - 11/25/18 0801    Consent Given?  Yes    Muscles Treated Upper Body  Upper trapezius;Longissimus    Upper Trapezius Response  Twitch reponse elicited;Palpable increased muscle length    Longissimus Response  Twitch response elicited;Palpable increased muscle length   cervical paraspinals               PT Long Term Goals - 11/04/18 0840      PT LONG TERM GOAL #1   Title  independent with HEP    Status  New    Target Date  12/16/18      PT LONG TERM GOAL #2   Title  improve  FOTO score to </= 25% limited for improved function    Status  New    Target Date  12/16/18      PT LONG TERM GOAL #3   Title  improve rt cervical rotation to at least 65 degrees for improved function and driving    Status  New    Target Date  12/16/18      PT LONG TERM GOAL #4   Title  report centralization of symptoms for improved pain and function    Status  New    Target Date  12/16/18      PT LONG TERM GOAL #5   Title  n/a            Plan - 11/25/18 0737    Clinical Impression Statement  Increase in Rt radicular symptoms last night likely from heavy overhead work with chain saw yesterday.   Pt had increase in radicular symptoms with Rt upper trap stretch with sitting on Rt hand; reduced with ulnar nerve glides.  Continued tightness within Rt cervical paraspinals, upper trap and scalenes; responded well to manual therapy and DN to area- elimination of symptoms afterward. Pt making gradual progress towards goals.       Rehab Potential  Good    PT Frequency  2x / week    PT Duration  6 weeks    PT Treatment/Interventions  ADLs/Self Care Home Management;Cryotherapy;Electrical Stimulation;Ultrasound;Traction;Moist Heat;Therapeutic activities;Therapeutic exercise;Patient/family education;Manual techniques;Taping;Dry needling;Passive range of motion    PT Next Visit Plan  continue postural exercises, manual/modalities/DN PRN    PT Home Exercise Plan  Access Code: [email or web address]  and [email or web address]    Consulted and Agree with Plan of Care  Patient       Patient will benefit from skilled therapeutic intervention in order to improve the following deficits and impairments:  Increased fascial restricitons, Increased muscle spasms, Pain, Postural dysfunction, Decreased range of motion, Impaired flexibility  Visit Diagnosis: Radiculopathy, cervical region  Abnormal posture     Problem List Patient Active Problem List   Diagnosis Date Noted  . Cervical radiculopathy 10/30/2018  . Elevated  blood pressure reading in office with diagnosis of hypertension 05/29/2018  . Right bundle branch block (RBBB) 05/29/2018  . Sinus bradycardia 05/29/2018  . Peripheral edema 04/27/2018  . Venous stasis dermatitis of right lower  extremity 04/27/2018  . Strain of calf muscle 04/27/2018  . Abdominal aortic ectasia (HCC) 03/12/2018  . 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 Recurrent major depressive disorder, in full remission (HCC) 02/25/2018  . Encounter for long-term (current) use of medications 02/25/2018  . Encounter for abdominal aortic aneurysm (AAA) screening 02/25/2018  . Dyslipidemia, goal LDL below 100 02/25/2018  . Refused influenza vaccine 02/25/2018  . Refused pneumococcal vaccination 02/25/2018  . Pruritus 08/04/2017  . Encounter for monitoring statin therapy 08/04/2017  . Depression 05/21/2017  . Family history of heart disease in male family member before age 21 05/21/2017  . Tear of meniscus of knee 05/21/2017  . Class 1 obesity due to excess calories with serious comorbidity in adult 05/21/2017  . Allergic rhinitis 05/21/2017  . DDD (degenerative disc disease), lumbosacral 05/21/2017  . Vasovagal episode 05/21/2017  . Hypertension goal BP (blood pressure) < 130/80 08/17/2015  . Pseudophakia of both eyes 10/20/2013  . Dyslipidemia 08/10/2013   Mayer Camel, PTA 11/25/18 8:02 AM   Clarita Crane, PT, DPT 11/25/18 8:02 AM     [facility] [address] [place], [place], [address] Phone: [phone]   Fax:  [phone]  Name: Lance Anderson MRN: [id number] Date of Birth: [birth year]

## 2018-12-02 ENCOUNTER — Encounter: Payer: Self-pay | Admitting: Rehabilitative and Restorative Service Providers"

## 2018-12-02 ENCOUNTER — Ambulatory Visit (INDEPENDENT_AMBULATORY_CARE_PROVIDER_SITE_OTHER): Payer: Medicare Other | Admitting: Rehabilitative and Restorative Service Providers"

## 2018-12-02 DIAGNOSIS — M25512 Pain in left shoulder: Secondary | ICD-10-CM

## 2018-12-02 DIAGNOSIS — M5412 Radiculopathy, cervical region: Secondary | ICD-10-CM | POA: Diagnosis not present

## 2018-12-02 DIAGNOSIS — R29898 Other symptoms and signs involving the musculoskeletal system: Secondary | ICD-10-CM | POA: Diagnosis not present

## 2018-12-02 DIAGNOSIS — R293 Abnormal posture: Secondary | ICD-10-CM

## 2018-12-02 DIAGNOSIS — G8929 Other chronic pain: Secondary | ICD-10-CM

## 2018-12-02 NOTE — Therapy (Signed)
[facility] [address] [place], [place], [address] Phone: [phone]   Fax:  [phone]  Physical Therapy Treatment  Patient Details  Name: Lance Anderson MRN: [id number] Date of Birth: Apr 30, 1951 Referring Provider (PT): Carlis Stableummings, Charley Elizabeth, [place]   Encounter Date: 12/02/2018  PT End of Session - 12/02/18 0852    Visit Number  7    Number of Visits  12    Date for PT Re-Evaluation  12/16/18    PT Start Time  0715    PT Stop Time  0814    PT Time Calculation (min)  59 min    Activity Tolerance  Patient tolerated treatment well       Past Medical History:  Diagnosis Date  . Abdominal aortic ectasia (HCC) 03/12/2018  . Allergy   . Cataract   . DDD (degenerative disc disease), lumbar   . Depression   . Hyperlipidemia   . Hypertension   . RBBB   . Vasovagal syncope     Past Surgical History:  Procedure Laterality Date  . CATARACT EXTRACTION, BILATERAL    . KNEE ARTHROSCOPY W/ MENISCAL REPAIR Left   . ROTATOR CUFF REPAIR Right   . WISDOM TOOTH EXTRACTION      There were no vitals filed for this visit.  Subjective Assessment - 12/02/18 0900    Subjective  Neck pain increased following DN last visit. Continues to have the tingling in the Rt arm on and off     Currently in Pain?  Yes    Pain Score  4     Pain Location  Neck    Pain Orientation  Right    Pain Descriptors / Indicators  Aching    Pain Type  Acute pain    Pain Onset  More than a month ago    Pain Frequency  Intermittent                       [facility] Adult PT Treatment/Exercise - 12/02/18 0001      Neck Exercises: Machines for Strengthening   UBE (Upper Arm Bike)  L2: 2 min each direction       Moist Heat Therapy   Number Minutes Moist Heat  20 Minutes    Moist Heat Location  Cervical;Shoulder   Rt     Electrical Stimulation   Electrical Stimulation Location  Rt upper trap, scalenes, cervical paraspinals     Electrical Stimulation Action  IFC    Electrical Stimulation Parameters  to tolerance    Electrical Stimulation Goals  Pain;Tone      Manual Therapy   Manual therapy comments  pt supine     Joint Mobilization  CPA and lateral glides through the cervical spine     Soft tissue mobilization  deep tissue work Rt > Lt scaleni/deep cervical musculature into the upper trap/pecs/anterior deltoid - working thorough the anterior shoulder and clavicular area with springing of 1st rib     Myofascial Release  lateral cervical to upper trap     Manual Traction  traction through long arm Rt UE at side; manual cervical traction 15-20 sec through the treatment 4-5 times       Neck Exercises: Stretches   Other Neck Stretches  3-position doorway stretch x 20 sec x 2 reps each position-  bilat bicep stretch holding door frame x 20 sec x 3 reps  PT Long Term Goals - 11/04/18 0840      PT LONG TERM GOAL #1   Title  independent with HEP    Status  New    Target Date  12/16/18      PT LONG TERM GOAL #2   Title  improve FOTO score to </= 25% limited for improved function    Status  New    Target Date  12/16/18      PT LONG TERM GOAL #3   Title  improve rt cervical rotation to at least 65 degrees for improved function and driving    Status  New    Target Date  12/16/18      PT LONG TERM GOAL #4   Title  report centralization of symptoms for improved pain and function    Status  New    Target Date  12/16/18      PT LONG TERM GOAL #5   Title  n/a            Plan - 12/02/18 0856    Clinical Impression Statement  Increased pain in the neck and shoulder following DN. Calming down some today. Decreased pain in the cervical spine with intermittent tingling in the Rt UE. Noted significant tightness through the Rt scaleni and deep cervical musculature. Responded well to manual work and modalities.     Rehab Potential  Good    PT Frequency  2x / week    PT Duration  6  weeks    PT Treatment/Interventions  ADLs/Self Care Home Management;Cryotherapy;Electrical Stimulation;Ultrasound;Traction;Moist Heat;Therapeutic activities;Therapeutic exercise;Patient/family education;Manual techniques;Taping;Dry needling;Passive range of motion    PT Next Visit Plan  continue postural exercises, manual/modalities/DN PRN - assess response to deep tissue work Rt cervical musculature     PT Home Exercise Plan  Access Code: [email or web address]  and [email or web address]    Consulted and Agree with Plan of Care  Patient       Patient will benefit from skilled therapeutic intervention in order to improve the following deficits and impairments:  Increased fascial restricitons, Increased muscle spasms, Pain, Postural dysfunction, Decreased range of motion, Impaired flexibility  Visit Diagnosis: Radiculopathy, cervical region  Abnormal posture  Chronic left shoulder pain  Other symptoms and signs involving the musculoskeletal system     Problem List Patient Active Problem List   Diagnosis Date Noted  . Cervical radiculopathy 10/30/2018  . Elevated blood pressure reading in office with diagnosis of hypertension 05/29/2018  . Right bundle branch block (RBBB) 05/29/2018  . Sinus bradycardia 05/29/2018  . Peripheral edema 04/27/2018  . Venous stasis dermatitis of right lower extremity 04/27/2018  . Strain of calf muscle 04/27/2018  . Abdominal aortic ectasia (HCC) 03/12/2018  . 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 Recurrent major depressive disorder, in full remission (HCC) 02/25/2018  . Encounter for long-term (current) use of medications 02/25/2018  . Encounter for abdominal aortic aneurysm (AAA) screening 02/25/2018  . Dyslipidemia, goal LDL below 100 02/25/2018  . Refused influenza vaccine 02/25/2018  . Refused pneumococcal vaccination 02/25/2018  . Pruritus 08/04/2017  . Encounter for monitoring statin therapy 08/04/2017  . Depression 05/21/2017  . Family history of heart  disease in male family member before age 77 05/21/2017  . Tear of meniscus of knee 05/21/2017  . Class 1 obesity due to excess calories with serious comorbidity in adult 05/21/2017  . Allergic rhinitis 05/21/2017  . DDD (degenerative disc disease), lumbosacral 05/21/2017  . Vasovagal episode 05/21/2017  . Hypertension goal BP (blood  pressure) < 130/80 08/17/2015  . Pseudophakia of both eyes 10/20/2013  . Dyslipidemia 08/10/2013    Celyn Rober Minion PT, MPH 12/02/2018, 10:00 AM  [facility] [address] [place], [place], [address] Phone: [phone]   Fax:  [phone]  Name: Lance Anderson MRN: [id number] Date of Birth: 03-17-1951

## 2018-12-04 ENCOUNTER — Ambulatory Visit (INDEPENDENT_AMBULATORY_CARE_PROVIDER_SITE_OTHER): Payer: Medicare Other | Admitting: Rehabilitative and Restorative Service Providers"

## 2018-12-04 ENCOUNTER — Encounter: Payer: Self-pay | Admitting: Rehabilitative and Restorative Service Providers"

## 2018-12-04 DIAGNOSIS — M5412 Radiculopathy, cervical region: Secondary | ICD-10-CM | POA: Diagnosis not present

## 2018-12-04 DIAGNOSIS — R293 Abnormal posture: Secondary | ICD-10-CM | POA: Diagnosis not present

## 2018-12-04 DIAGNOSIS — R29898 Other symptoms and signs involving the musculoskeletal system: Secondary | ICD-10-CM | POA: Diagnosis not present

## 2018-12-04 NOTE — Therapy (Signed)
[facility] [address] [place], [place], [address] Phone: [phone]   Fax:  [phone]  Physical Therapy Treatment  Patient Details  Name: Lance GottronJohnny Anderson MRN: [id number] Date of Birth: [birth year] Referring Provider (PT): Carlis Stableummings, [place], [place]   Encounter Date: 12/04/2018  PT End of Session - 12/04/18 0852    Visit Number  8    Number of Visits  12    Date for PT Re-Evaluation  12/16/18    PT Start Time  0759    PT Stop Time  0858    PT Time Calculation (min)  59 min    Activity Tolerance  Patient tolerated treatment well       Past Medical History:  Diagnosis Date  . Abdominal aortic ectasia (HCC) 03/12/2018  . Allergy   . Cataract   . DDD (degenerative disc disease), lumbar   . Depression   . Hyperlipidemia   . Hypertension   . RBBB   . Vasovagal syncope     Past Surgical History:  Procedure Laterality Date  . CATARACT EXTRACTION, BILATERAL    . KNEE ARTHROSCOPY W/ MENISCAL REPAIR Left   . ROTATOR CUFF REPAIR Right   . WISDOM TOOTH EXTRACTION      There were no vitals filed for this visit.  Subjective Assessment - 12/04/18 0853    Subjective  About the same - stil has intermittent pain in the shoulder area and tingling in the Rt UE. Sits in a recliner at home. Noticed tingling this am when sitting in the recliner petting the dog.     Currently in Pain?  Yes    Pain Score  4     Pain Location  Neck    Pain Orientation  Right    Pain Descriptors / Indicators  Aching                       [facility] Adult PT Treatment/Exercise - 12/04/18 0001      Neck Exercises: Machines for Strengthening   UBE (Upper Arm Bike)  L2: 2 min each direction       Neck Exercises: Seated   Neck Retraction  5 reps;5 secs      Neck Exercises: Supine   Neck Retraction  5 reps;5 secs      Moist Heat Therapy   Number Minutes Moist Heat  20 Minutes    Moist Heat Location  Cervical;Shoulder    Rt     Electrical Stimulation   Electrical Stimulation Location  Rt upper trap, scalenes, cervical paraspinals     Electrical Stimulation Action  IFC    Electrical Stimulation Parameters  to tolerance    Electrical Stimulation Goals  Pain;Tone      Manual Therapy   Manual therapy comments  pt supine     Joint Mobilization  CPA and lateral glides through the cervical spine     Soft tissue mobilization  deep tissue work Rt > Lt scaleni/deep cervical musculature into the upper trap/pecs/anterior deltoid - working thorough the anterior shoulder and clavicular area with springing of 1st rib     Myofascial Release  lateral cervical to upper trap     Passive ROM  PROM and stretching cervical spine into flexioin; flexion with slight rotation; axial extensin(reproduced tingling); scaleni stretches to Lt     Manual Traction  traction through long arm Rt UE at side; manual cervical traction 15-20 sec through the treatment 4-5 times  Neck Exercises: Stretches   Other Neck Stretches  3-position doorway stretch x 20 sec x 2 reps each position-  bilat bicep stretch holding door frame x 20 sec x 3 reps        Trigger Point Dry Needling - 12/04/18 0856    Consent Given?  Yes    Scalenes Response  Palpable increased muscle length    Longissimus Response  Palpable increased muscle length           PT Education - 12/04/18 0905    Education Details  education re posture and alignment in sitting - modification for recliner to get out of rounded posture           PT Long Term Goals - 11/04/18 0840      PT LONG TERM GOAL #1   Title  independent with HEP    Status  New    Target Date  12/16/18      PT LONG TERM GOAL #2   Title  improve FOTO score to </= 25% limited for improved function    Status  New    Target Date  12/16/18      PT LONG TERM GOAL #3   Title  improve rt cervical rotation to at least 65 degrees for improved function and driving    Status  New    Target Date   12/16/18      PT LONG TERM GOAL #4   Title  report centralization of symptoms for improved pain and function    Status  New    Target Date  12/16/18      PT LONG TERM GOAL #5   Title  n/a            Plan - 12/04/18 0853    Clinical Impression Statement  Continued symptoms. Patient has palpable tightness through the Rt lateral cervical spine musculature/scalenes. Some decresae in palpable tightness with DN and manual work. Discussed cervical and thoracic posture and alignment encouraging pt to get out of the recliner or modify recliner to avoid head forward posture. Continue efforts     Rehab Potential  Good    PT Frequency  2x / week    PT Duration  6 weeks    PT Treatment/Interventions  ADLs/Self Care Home Management;Cryotherapy;Electrical Stimulation;Ultrasound;Traction;Moist Heat;Therapeutic activities;Therapeutic exercise;Patient/family education;Manual techniques;Taping;Dry needling;Passive range of motion    PT Next Visit Plan  continue postural exercises, manual/modalities/DN PRN - assess response to deep tissue work Rt cervical musculature     PT Home Exercise Plan  Access Code: [email or web address]  and [email or web address]    Consulted and Agree with Plan of Care  Patient       Patient will benefit from skilled therapeutic intervention in order to improve the following deficits and impairments:  Increased fascial restricitons, Increased muscle spasms, Pain, Postural dysfunction, Decreased range of motion, Impaired flexibility  Visit Diagnosis: Radiculopathy, cervical region  Abnormal posture  Other symptoms and signs involving the musculoskeletal system     Problem List Patient Active Problem List   Diagnosis Date Noted  . Cervical radiculopathy 10/30/2018  . Elevated blood pressure reading in office with diagnosis of hypertension 05/29/2018  . Right bundle branch block (RBBB) 05/29/2018  . Sinus bradycardia 05/29/2018  . Peripheral edema 04/27/2018  . Venous stasis dermatitis of  right lower extremity 04/27/2018  . Strain of calf muscle 04/27/2018  . Abdominal aortic ectasia (HCC) 03/12/2018  . 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 Recurrent major depressive disorder, in full  remission (HCC) 02/25/2018  . Encounter for long-term (current) use of medications 02/25/2018  . Encounter for abdominal aortic aneurysm (AAA) screening 02/25/2018  . Dyslipidemia, goal LDL below 100 02/25/2018  . Refused influenza vaccine 02/25/2018  . Refused pneumococcal vaccination 02/25/2018  . Pruritus 08/04/2017  . Encounter for monitoring statin therapy 08/04/2017  . Depression 05/21/2017  . Family history of heart disease in male family member before age 86 05/21/2017  . Tear of meniscus of knee 05/21/2017  . Class 1 obesity due to excess calories with serious comorbidity in adult 05/21/2017  . Allergic rhinitis 05/21/2017  . DDD (degenerative disc disease), lumbosacral 05/21/2017  . Vasovagal episode 05/21/2017  . Hypertension goal BP (blood pressure) < 130/80 08/17/2015  . Pseudophakia of both eyes 10/20/2013  . Dyslipidemia 08/10/2013    Aamori Mcmasters Rober Minion PT, MPH 12/04/2018, 9:06 AM  [facility] [address] [place], [place], [address] Phone: [phone]   Fax:  [phone]  Name: Cainen Burnham MRN: [id number] Date of Birth: [birth year]

## 2018-12-08 ENCOUNTER — Encounter: Payer: Self-pay | Admitting: Rehabilitative and Restorative Service Providers"

## 2018-12-08 ENCOUNTER — Ambulatory Visit (INDEPENDENT_AMBULATORY_CARE_PROVIDER_SITE_OTHER): Payer: Medicare Other | Admitting: Rehabilitative and Restorative Service Providers"

## 2018-12-08 DIAGNOSIS — M25512 Pain in left shoulder: Secondary | ICD-10-CM

## 2018-12-08 DIAGNOSIS — M5412 Radiculopathy, cervical region: Secondary | ICD-10-CM

## 2018-12-08 DIAGNOSIS — G8929 Other chronic pain: Secondary | ICD-10-CM

## 2018-12-08 DIAGNOSIS — R29898 Other symptoms and signs involving the musculoskeletal system: Secondary | ICD-10-CM | POA: Diagnosis not present

## 2018-12-08 DIAGNOSIS — R293 Abnormal posture: Secondary | ICD-10-CM | POA: Diagnosis not present

## 2018-12-08 NOTE — Therapy (Signed)
[facility] [place] [address] [place], [place], [address] Phone: [phone]   Fax:  [phone]  Physical Therapy Treatment  Patient Details  Name: Lance Anderson MRN: [id number] Date of Birth: 17-Jun-1951 Referring Provider (PT): Carlis Stable, [place]   Encounter Date: 12/08/2018  PT End of Session - 12/08/18 0840    Visit Number  9    Number of Visits  12    Date for PT Re-Evaluation  12/16/18    PT Start Time  0801    PT Stop Time  0856    PT Time Calculation (min)  55 min    Activity Tolerance  Patient tolerated treatment well       Past Medical History:  Diagnosis Date  . Abdominal aortic ectasia (HCC) 03/12/2018  . Allergy   . Cataract   . DDD (degenerative disc disease), lumbar   . Depression   . Hyperlipidemia   . Hypertension   . RBBB   . Vasovagal syncope     Past Surgical History:  Procedure Laterality Date  . CATARACT EXTRACTION, BILATERAL    . KNEE ARTHROSCOPY W/ MENISCAL REPAIR Left   . ROTATOR CUFF REPAIR Right   . WISDOM TOOTH EXTRACTION      There were no vitals filed for this visit.  Subjective Assessment - 12/08/18 0841    Subjective  A little better. Tingling is less intense and he can get rid of when it happens     Currently in Pain?  No/denies                       [facility] Adult PT Treatment/Exercise - 12/08/18 0001      Neck Exercises: Supine   Neck Retraction  5 reps;5 secs      Moist Heat Therapy   Number Minutes Moist Heat  20 Minutes    Moist Heat Location  Cervical;Shoulder   Rt     Electrical Stimulation   Electrical Stimulation Location  Rt upper trap, scalenes, cervical paraspinals     Electrical Stimulation Action  IFC    Electrical Stimulation Parameters  to tolerance    Electrical Stimulation Goals  Pain;Tone      Ultrasound   Ultrasound Location  1.5 w/cm2; 100%; 1 mHz; 8 min     Ultrasound Parameters  Rt lateral cervical spine     Ultrasound Goals  Other (Comment)   tightness      Manual Therapy   Manual therapy comments  pt supine     Joint Mobilization  CPA and lateral glides through the cervical spine     Soft tissue mobilization  deep tissue work Rt > Lt scaleni/deep cervical musculature into the upper trap/pecs/anterior deltoid - working thorough the anterior shoulder and clavicular area with springing of 1st rib     Myofascial Release  lateral cervical to upper trap     Passive ROM  PROM and stretching cervical spine into flexioin; flexion with slight rotation; axial extensin(reproduced tingling); scaleni stretches to Lt     Manual Traction  traction through long arm Rt UE at side; manual cervical traction 15-20 sec through the treatment 4-5 times                   PT Long Term Goals - 11/04/18 0840      PT LONG TERM GOAL #1   Title  independent with HEP    Status  New    Target Date  12/16/18      PT LONG TERM GOAL #2   Title  improve FOTO score to </= 25% limited for improved function    Status  New    Target Date  12/16/18      PT LONG TERM GOAL #3   Title  improve rt cervical rotation to at least 65 degrees for improved function and driving    Status  New    Target Date  12/16/18      PT LONG TERM GOAL #4   Title  report centralization of symptoms for improved pain and function    Status  New    Target Date  12/16/18      PT LONG TERM GOAL #5   Title  n/a            Plan - 12/08/18 0840    Clinical Impression Statement  Decreased tingling intensity and pt can get rid of tingling when it occurs. Less palpable tightness in Rt cervical musculature. Progressing gradually toward rehab goals.     Rehab Potential  Good    PT Frequency  2x / week    PT Duration  6 weeks    PT Treatment/Interventions  ADLs/Self Care Home Management;Cryotherapy;Electrical Stimulation;Ultrasound;Traction;Moist Heat;Therapeutic activities;Therapeutic exercise;Patient/family education;Manual  techniques;Taping;Dry needling;Passive range of motion    PT Next Visit Plan  continue postural exercises, manual/modalities/DN PRN - continue deep tissue work Rt cervical musculature     PT Home Exercise Plan  Access Code: [email or web address]  and [email or web address]    Consulted and Agree with Plan of Care  Patient       Patient will benefit from skilled therapeutic intervention in order to improve the following deficits and impairments:  Increased fascial restricitons, Increased muscle spasms, Pain, Postural dysfunction, Decreased range of motion, Impaired flexibility  Visit Diagnosis: Radiculopathy, cervical region  Abnormal posture  Other symptoms and signs involving the musculoskeletal system  Chronic left shoulder pain     Problem List Patient Active Problem List   Diagnosis Date Noted  . Cervical radiculopathy 10/30/2018  . Elevated blood pressure reading in office with diagnosis of hypertension 05/29/2018  . Right bundle branch block (RBBB) 05/29/2018  . Sinus bradycardia 05/29/2018  . Peripheral edema 04/27/2018  . Venous stasis dermatitis of right lower extremity 04/27/2018  . Strain of calf muscle 04/27/2018  . Abdominal aortic ectasia (HCC) 03/12/2018  . 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 Recurrent major depressive disorder, in full remission (HCC) 02/25/2018  . Encounter for long-term (current) use of medications 02/25/2018  . Encounter for abdominal aortic aneurysm (AAA) screening 02/25/2018  . Dyslipidemia, goal LDL below 100 02/25/2018  . Refused influenza vaccine 02/25/2018  . Refused pneumococcal vaccination 02/25/2018  . Pruritus 08/04/2017  . Encounter for monitoring statin therapy 08/04/2017  . Depression 05/21/2017  . Family history of heart disease in male family member before age 65 05/21/2017  . Tear of meniscus of knee 05/21/2017  . Class 1 obesity due to excess calories with serious comorbidity in adult 05/21/2017  . Allergic rhinitis 05/21/2017  . DDD  (degenerative disc disease), lumbosacral 05/21/2017  . Vasovagal episode 05/21/2017  . Hypertension goal BP (blood pressure) < 130/80 08/17/2015  . Pseudophakia of both eyes 10/20/2013  . Dyslipidemia 08/10/2013    Cortlyn Cannell Rober MinionP Agape Hardiman PT, MPH  12/08/2018, 8:45 AM  [facility] [address] [place], [place], [address] Phone: [phone]   Fax:  [phone]  Name: Lance GottronJohnny Anderson MRN: [id number] Date of  Birth: 03/13/1951

## 2018-12-11 ENCOUNTER — Ambulatory Visit (INDEPENDENT_AMBULATORY_CARE_PROVIDER_SITE_OTHER): Payer: Medicare Other | Admitting: Rehabilitative and Restorative Service Providers"

## 2018-12-11 ENCOUNTER — Encounter: Payer: Self-pay | Admitting: Rehabilitative and Restorative Service Providers"

## 2018-12-11 DIAGNOSIS — M5412 Radiculopathy, cervical region: Secondary | ICD-10-CM

## 2018-12-11 DIAGNOSIS — M25512 Pain in left shoulder: Secondary | ICD-10-CM

## 2018-12-11 DIAGNOSIS — G8929 Other chronic pain: Secondary | ICD-10-CM

## 2018-12-11 DIAGNOSIS — R293 Abnormal posture: Secondary | ICD-10-CM

## 2018-12-11 DIAGNOSIS — R29898 Other symptoms and signs involving the musculoskeletal system: Secondary | ICD-10-CM | POA: Diagnosis not present

## 2018-12-11 NOTE — Patient Instructions (Addendum)
Shoulder Blade Squeeze: Arms at Sides    Arms at sides, parallel, elbows straight, palms up. Press pelvis down. Squeeze backbone with shoulder blades, raising front of shoulders, chest, and arms. Keep head and neck neutral. Hold _5-10__ seconds. Relax. Repeat _5-10__ times.   Shoulder Blade Squeeze: W    Arms out to sides at 90 palms down. Bend elbows to 90. Press pelvis down. Squeeze backbone with shoulder blades. Raise arms, front of shoulders, chest, and head. Keep neck neutral. Hold _5-10__ seconds. Relax. Repeat _5-10__ times.   Shoulder Blade Squeeze: Airplane    Arms out to sides at 90, elbows straight, palms down. Press pelvis down. Squeeze backbone with shoulder blades. Raise arms, front of shoulders, chest, and head. Keep neck neutral. Hold _5-10__ seconds. Relax. Repeat __5-10_ times.

## 2018-12-11 NOTE — Therapy (Addendum)
[address] [address] [place], [place], [address] Phone: [phone]   Fax:  [phone]  Physical Therapy Treatment  Patient Details  Name: Lance Anderson MRN: [id number] Date of Birth: [birth year] Referring Provider (PT): Trixie Dredge, [place]  Progress Note Reporting Period 11/04/18 to 12/11/18  See note below for Objective Data and Assessment of Progress/Goals.       Encounter Date: 12/11/2018  PT End of Session - 12/11/18 0806    Visit Number  10    Number of Visits  12    Date for PT Re-Evaluation  12/16/18    PT Start Time  0805    PT Stop Time  0904    PT Time Calculation (min)  59 min    Activity Tolerance  Patient tolerated treatment well       Past Medical History:  Diagnosis Date  . Abdominal aortic ectasia ([place]) 03/12/2018  . Allergy   . Cataract   . DDD (degenerative disc disease), lumbar   . Depression   . Hyperlipidemia   . Hypertension   . RBBB   . Vasovagal syncope     Past Surgical History:  Procedure Laterality Date  . CATARACT EXTRACTION, BILATERAL    . KNEE ARTHROSCOPY W/ MENISCAL REPAIR Left   . ROTATOR CUFF REPAIR Right   . WISDOM TOOTH EXTRACTION      There were no vitals filed for this visit.  Subjective Assessment - 12/11/18 0816    Subjective  OK today and yesterday but had a terrible day Wednesday with increased tingling and pain in the Rt arm. Took naproxyn to halp him sleep     Currently in Pain?  No/denies         [facility] PT Assessment - 12/11/18 0001      Assessment   Medical Diagnosis  M54.12 (ICD-10-CM) - Cervical radiculopathy    Referring Provider (PT)  Trixie Dredge, PA-C    Onset Date/Surgical Date  09/22/18    Hand Dominance  Right    Next MD Visit  March 2019; plans to see sports med if no improvement    Prior Therapy  at this clinic for Lt shoulder 4-5 months ago      Observation/Other Assessments   Focus on Therapeutic  Outcomes (FOTO)   26% limitation       AROM   Cervical Flexion  50    Cervical Extension  56    Cervical - Right Side Bend  30    Cervical - Left Side Bend  30    Cervical - Right Rotation  75    Cervical - Left Rotation  72      Palpation   Spinal mobility  hypomobile thoracic with CPA mobs     Palpation comment  tightness noted in Rt scaleni; deep cervical musculature; upper trap and levator, pecs                   [facility] Adult PT Treatment/Exercise - 12/11/18 0001      Neck Exercises: Seated   Neck Retraction  5 reps;10 secs      Neck Exercises: Supine   Neck Retraction  5 reps;5 secs      Neck Exercises: Prone   Other Prone Exercise  prone extension; scap squeeze with extension; airplane x 5 each 5 sec hold       Moist Heat Therapy   Number Minutes Moist Heat  20 Minutes    Moist  Heat Location  Cervical;Shoulder   Rt     Acupuncturist Location  Rt upper trap, scalenes, cervical paraspinals     Electrical Stimulation Action  IFC    Electrical Stimulation Parameters  to tolerance    Electrical Stimulation Goals  Pain;Tone      Manual Therapy   Manual therapy comments  pt supine     Joint Mobilization  CPA and lateral glides through the cervical spine     Soft tissue mobilization  deep tissue work Rt > Lt scaleni/deep cervical musculature into the upper trap/pecs/anterior deltoid - working thorough the anterior shoulder and clavicular area with springing of 1st rib     Myofascial Release  lateral cervical to upper trap     Passive ROM  PROM and stretching cervical spine into flexioin; flexion with slight rotation; axial extensin(reproduced tingling); scaleni stretches to Lt     Manual Traction  traction through long arm Rt UE at side; manual cervical traction 15-20 sec through the treatment 4-5 times       Neck Exercises: Stretches   Other Neck Stretches  3-position doorway stretch x 20 sec x 2 reps each position-  bilat  bicep stretch holding door frame x 20 sec x 3 reps              PT Education - 12/11/18 4259    Education Details  HEP     Person(s) Educated  Patient    Methods  Explanation;Demonstration;Tactile cues;Verbal cues;Handout    Comprehension  Verbalized understanding;Returned demonstration;Verbal cues required;Tactile cues required          PT Long Term Goals - 12/11/18 1122      PT LONG TERM GOAL #1   Title  independent with HEP    Time  6    Period  Weeks    Status  Achieved      PT LONG TERM GOAL #2   Title  improve FOTO score to </= 25% limited for improved function - 26% limitation 12/11/18    Time  6    Period  Weeks    Status  Partially Met      PT LONG TERM GOAL #3   Title  improve rt cervical rotation to at least 65 degrees for improved function and driving    Time  6    Period  Weeks    Status  Achieved      PT LONG TERM GOAL #4   Title  report centralization of symptoms for improved pain and function    Time  6    Period  Weeks    Status  Achieved            Plan - 12/11/18 0807    Clinical Impression Statement  Tingling increased with cervical extension. Note decreased palpable tightness Rt cervical musculature. Some increase in cervical ROM and good decrease in pain - seldom has pain now. Decrease in tingling Rt UE but still has some on an intermittent basis. Will place pt on hold for 2-3 weeks at his request with holidays and travel. He will call to schedule as indicated.     Rehab Potential  Good    PT Frequency  2x / week    PT Duration  6 weeks    PT Treatment/Interventions  ADLs/Self Care Home Management;Cryotherapy;Electrical Stimulation;Ultrasound;Traction;Moist Heat;Therapeutic activities;Therapeutic exercise;Patient/family education;Manual techniques;Taping;Dry needling;Passive range of motion    PT Next Visit Plan  continue postural exercises, manual/modalities/DN PRN -  continue deep tissue work Rt cervical musculature - hold x 2-3 weeks  pt will call to schedule as needed    PT Home Exercise Plan  Access Code: [email or web address]  and [email or web address]    Consulted and Agree with Plan of Care  Patient       Patient will benefit from skilled therapeutic intervention in order to improve the following deficits and impairments:  Increased fascial restricitons, Increased muscle spasms, Pain, Postural dysfunction, Decreased range of motion, Impaired flexibility  Visit Diagnosis: Radiculopathy, cervical region  Abnormal posture  Other symptoms and signs involving the musculoskeletal system  Chronic left shoulder pain     Problem List Patient Active Problem List   Diagnosis Date Noted  . Cervical radiculopathy 10/30/2018  . Elevated blood pressure reading in office with diagnosis of hypertension 05/29/2018  . Right bundle branch block (RBBB) 05/29/2018  . Sinus bradycardia 05/29/2018  . Peripheral edema 04/27/2018  . Venous stasis dermatitis of right lower extremity 04/27/2018  . Strain of calf muscle 04/27/2018  . Abdominal aortic ectasia ([place]) 03/12/2018  . 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 Recurrent major depressive disorder, in full remission ([place]) 02/25/2018  . Encounter for long-term (current) use of medications 02/25/2018  . Encounter for abdominal aortic aneurysm (AAA) screening 02/25/2018  . Dyslipidemia, goal LDL below 100 02/25/2018  . Refused influenza vaccine 02/25/2018  . Refused pneumococcal vaccination 02/25/2018  . Pruritus 08/04/2017  . Encounter for monitoring statin therapy 08/04/2017  . Depression 05/21/2017  . Family history of heart disease in male family member before age 35 05/21/2017  . Tear of meniscus of knee 05/21/2017  . Class 1 obesity due to excess calories with serious comorbidity in adult 05/21/2017  . Allergic rhinitis 05/21/2017  . DDD (degenerative disc disease), lumbosacral 05/21/2017  . Vasovagal episode 05/21/2017  . Hypertension goal BP (blood pressure) < 130/80 08/17/2015  .  Pseudophakia of both eyes 10/20/2013  . Dyslipidemia 08/10/2013     Celyn Nilda Simmer PT,MPH  12/11/2018, 11:27 AM  [facility] [place] [place], [place], [address] Phone: [phone]   Fax:  [phone]  Name: Lance Anderson MRN: [id number] Date of Birth: [birth year]  PHYSICAL THERAPY DISCHARGE SUMMARY  Visits from Start of Care: 10  Current functional level related to goals / functional outcomes: See progress notes for discharge status.   Remaining deficits: Some continued intermittent radicular pain - in part positional in nature - should continue with HEP and postural correction    Education / Equipment: HEP   Plan: Patient agrees to discharge.  Patient goals were partially met. Patient is being discharged due to not returning since the last visit.  ?????    Celyn P. Helene Kelp PT, MPH 01/20/19 4:21 PM

## 2018-12-12 ENCOUNTER — Other Ambulatory Visit: Payer: Self-pay | Admitting: Physician Assistant

## 2018-12-12 DIAGNOSIS — J309 Allergic rhinitis, unspecified: Secondary | ICD-10-CM

## 2019-01-20 ENCOUNTER — Other Ambulatory Visit: Payer: Self-pay | Admitting: Physician Assistant

## 2019-01-20 DIAGNOSIS — M5137 Other intervertebral disc degeneration, lumbosacral region: Secondary | ICD-10-CM

## 2019-01-21 ENCOUNTER — Other Ambulatory Visit: Payer: Self-pay | Admitting: Physician Assistant

## 2019-01-21 DIAGNOSIS — E785 Hyperlipidemia, unspecified: Secondary | ICD-10-CM

## 2019-01-21 DIAGNOSIS — I10 Essential (primary) hypertension: Secondary | ICD-10-CM

## 2019-02-22 ENCOUNTER — Ambulatory Visit: Payer: Medicare Other | Admitting: Physician Assistant

## 2019-02-24 ENCOUNTER — Ambulatory Visit (INDEPENDENT_AMBULATORY_CARE_PROVIDER_SITE_OTHER): Payer: Medicare Other | Admitting: Physician Assistant

## 2019-02-24 ENCOUNTER — Encounter: Payer: Self-pay | Admitting: Physician Assistant

## 2019-02-24 VITALS — BP 144/78 | HR 62 | Wt 242.0 lb

## 2019-02-24 DIAGNOSIS — I10 Essential (primary) hypertension: Secondary | ICD-10-CM | POA: Diagnosis not present

## 2019-02-24 DIAGNOSIS — M150 Primary generalized (osteo)arthritis: Secondary | ICD-10-CM | POA: Diagnosis not present

## 2019-02-24 DIAGNOSIS — M5137 Other intervertebral disc degeneration, lumbosacral region: Secondary | ICD-10-CM | POA: Diagnosis not present

## 2019-02-24 DIAGNOSIS — E785 Hyperlipidemia, unspecified: Secondary | ICD-10-CM

## 2019-02-24 DIAGNOSIS — M159 Polyosteoarthritis, unspecified: Secondary | ICD-10-CM

## 2019-02-24 DIAGNOSIS — M8949 Other hypertrophic osteoarthropathy, multiple sites: Secondary | ICD-10-CM | POA: Insufficient documentation

## 2019-02-24 LAB — COMPLETE METABOLIC PANEL WITH GFR
AG RATIO: 1.5 (calc) (ref 1.0–2.5)
ALT: 18 U/L (ref 9–46)
AST: 20 U/L (ref 10–35)
Albumin: 4 g/dL (ref 3.6–5.1)
Alkaline phosphatase (APISO): 80 U/L (ref 35–144)
BUN: 24 mg/dL (ref 7–25)
CALCIUM: 9 mg/dL (ref 8.6–10.3)
CO2: 27 mmol/L (ref 20–32)
Chloride: 108 mmol/L (ref 98–110)
Creat: 1 mg/dL (ref 0.70–1.25)
GFR, EST AFRICAN AMERICAN: 90 mL/min/{1.73_m2} (ref >=60)
GFR, EST NON AFRICAN AMERICAN: 78 mL/min/{1.73_m2} (ref >=60)
GLOBULIN: 2.6 g/dL (ref 1.9–3.7)
Glucose, Bld: 95 mg/dL (ref 65–99)
POTASSIUM: 4.4 mmol/L (ref 3.5–5.3)
SODIUM: 141 mmol/L (ref 135–146)
TOTAL PROTEIN: 6.6 g/dL (ref 6.1–8.1)
Total Bilirubin: 0.8 mg/dL (ref 0.2–1.2)

## 2019-02-24 LAB — LIPID PANEL W/REFLEX DIRECT LDL
Cholesterol: 144 mg/dL (ref <200)
HDL: 51 mg/dL (ref >=40)
LDL Cholesterol (Calc): 80 mg/dL (calc)
NON-HDL CHOLESTEROL (CALC): 93 mg/dL (ref <130)
Total CHOL/HDL Ratio: 2.8 (calc) (ref <5.0)
Triglycerides: 53 mg/dL (ref <150)

## 2019-02-24 MED ORDER — ACETAMINOPHEN ER 650 MG PO TBCR: 650.0000 mg | EXTENDED_RELEASE_TABLET | Freq: Three times a day (TID) | ORAL | 3 refills | Status: DC | PRN

## 2019-02-24 NOTE — Progress Notes (Signed)
HPI:                                                                Lance Anderson is a 68 y.o. male who presents to [facility]: Primary Care Sports Medicine today for hypertension f/u  HTN: taking Amlodipine 10 mg daily. Compliant with medications. Has not monitored his BP in the last month, but previously he notes that his BP was higher in the morning than the evening, approx 140's/90's compared to 130's/80's in the evening. Endorses mild leg swelling. Denies vision change, headache, chest pain with exertion, orthopnea, lightheadedness, syncope. Risk factors include: age>55, male sex, HLD, fam hx  HLD: taking Atorvastatin. Denies myalgias.   Depression screen [facility] 2/9 02/24/2019 05/29/2018 05/29/2018 02/25/2018 08/04/2017  Decreased Interest 0 0 0 0 0  Down, Depressed, Hopeless 0 0 0 0 0  PHQ - 2 Score 0 0 0 0 0  Altered sleeping -  Tired, decreased energy 0 1 1 0 -  Change in appetite -  Feeling bad or failure about yourself  0 0 0 0 -  Trouble concentrating 0 0 0 0 -  Moving slowly or fidgety/restless 0 0 0 0 -  Suicidal thoughts 0 0 0 0 -  PHQ-9 Score -  Difficult doing work/chores - Not difficult at all - - -      Past Medical History:  Diagnosis Date  . Abdominal aortic ectasia (HCC) 03/12/2018  . Allergy   . Cataract   . DDD (degenerative disc disease), lumbar   . Depression   . Hyperlipidemia   . Hypertension   . RBBB   . Vasovagal syncope    Past Surgical History:  Procedure Laterality Date  . CATARACT EXTRACTION, BILATERAL    . KNEE ARTHROSCOPY W/ MENISCAL REPAIR Left   . ROTATOR CUFF REPAIR Right   . WISDOM TOOTH EXTRACTION     Social History   Tobacco Use  . Smoking status: Passive Smoke Exposure - Never Smoker  . Smokeless tobacco: Never Used  Substance Use Topics  . Alcohol use: Yes    Alcohol/week: 4.0 standard drinks    Types: 4 Cans of beer per week   family history includes Heart attack in his brother and  paternal grandfather; Heart disease in his father; Lung cancer in his father.    Review of Systems  Cardiovascular: Positive for leg swelling.  Musculoskeletal: Positive for arthralgias (OA) and back pain (w/right sided radicular pain).  All other systems reviewed and are negative.    Medications: Current Outpatient Medications  Medication Sig Dispense Refill  . amLODipine (NORVASC) 10 MG tablet Take 1 tablet (10 mg total) by mouth daily. Due for follow up visit w/PCP 30 tablet 0  . aspirin 81 MG EC tablet Take 1 tablet (81 mg total) by mouth daily. 30 tablet 11  . atorvastatin (LIPITOR) 40 MG tablet TAKE 1 TABLET BY MOUTH AT BEDTIME 90 tablet 0  . cetirizine (ZYRTEC) 5 MG tablet Take 1-2 tablets (5-10 mg total) by mouth daily. 30 tablet 0  . FLUoxetine (PROZAC) 20 MG capsule TAKE 1 CAPSULE BY MOUTH ONCE DAILY, NEEDS FOLLOW UP APPOINTMENT FOR REFILLS 90 capsule 1  . fluticasone (  FLONASE) 50 MCG/ACT nasal spray USE 2 SPRAY(S) IN EACH NOSTRIL ONCE DAILY 16 g 0  . naproxen (NAPROSYN) 500 MG tablet TAKE 1 TABLET BY MOUTH TWICE DAILY WITH A MEAL 60 tablet 0  . acetaminophen (TYLENOL) 650 MG CR tablet Take 1-2 tablets (650-1,300 mg total) by mouth every 8 (eight) hours as needed for pain. 90 tablet 3   No current facility-administered medications for this visit.    Allergies  Allergen Reactions  . Codeine Other (See Comments)    Chest pain  . Paroxetine Other (See Comments)    Makes spacey  . Penicillins Diarrhea       Objective:  BP (!) 144/78   Pulse 62   Wt 242 lb (109.8 kg)   BMI 33.75 kg/m  Gen:  alert, not ill-appearing, no distress, appropriate for age, obese male HEENT: head normocephalic without obvious abnormality, conjunctiva and cornea clear, no carotid bruit, trachea midline Pulm: Normal work of breathing, normal phonation Neuro: alert and oriented x 3, no tremor MSK: extremities atraumatic, normal gait and station Skin: intact, no rashes on exposed skin, no  jaundice, no cyanosis Psych: well-groomed, cooperative, good eye contact, euthymic mood, affect mood-congruent, speech is articulate, and thought processes clear and goal-directed  Lab Results  Component Value Date   CREATININE 1.07 02/25/2018   BUN 20 02/25/2018   NA 140 02/25/2018   K 4.7 02/25/2018   CL 105 02/25/2018   CO2 31 02/25/2018   Lab Results  Component Value Date   CHOL 204 (H) 02/25/2018   HDL 59 02/25/2018   LDLCALC 131 (H) 02/25/2018   TRIG 52 02/25/2018   CHOLHDL 3.5 02/25/2018    The 15-BWIO ASCVD risk score Denman George DC Jr., et al., 2013) is: 17.9%   Values used to calculate the score:     Age: 57 years     Sex: Male     Is Non-Hispanic African American: No     Diabetic: No     Tobacco smoker: No     Systolic Blood Pressure: 144 mmHg     Is BP treated: Yes     HDL Cholesterol: 59 mg/dL     Total Cholesterol: 204 mg/dL   Wt Readings from Last 3 Encounters:  02/24/19 242 lb (109.8 kg)  10/30/18 249 lb (112.9 kg)  08/05/18 246 lb (111.6 kg)      Assessment and Plan: 68 y.o. male with   .Ansh was seen today for hypertension.  Diagnoses and all orders for this visit:  Essential hypertension with goal blood pressure less than 140/90 -     COMPLETE METABOLIC PANEL WITH GFR  DDD (degenerative disc disease), lumbosacral -     acetaminophen (TYLENOL) 650 MG CR tablet; Take 1-2 tablets (650-1,300 mg total) by mouth every 8 (eight) hours as needed for pain.  Primary osteoarthritis involving multiple joints -     acetaminophen (TYLENOL) 650 MG CR tablet; Take 1-2 tablets (650-1,300 mg total) by mouth every 8 (eight) hours as needed for pain.  Dyslipidemia, goal LDL below 100 -     Lipid Panel w/reflex Direct LDL   HTN: Discussed option to dose Amlodipine at bedtime due to morning labile BP's. Patient stated he probably won't remember to take medication in the evening, so I recommend continuing morning medication. Continue baby aspirin for primary  prevention.  Due for fasting labs today. Will send refills pending lab results.   Patient education and anticipatory guidance given Patient agrees with treatment plan Follow-up in  6 months or sooner as needed if symptoms worsen or fail to improve  Levonne Hubert PA-C

## 2019-02-24 NOTE — Patient Instructions (Addendum)
For your blood pressure: - Goal <130/80 (Ideally 120's/70's) Option to take your blood pressure medicine at bedtime or split your dose in half and take twice a day - take baby aspirin 81 mg daily to help prevent heart attack/stroke - monitor and log blood pressures at home - check around the same time each day in a relaxed setting - Limit salt to <2500 mg/day - Follow DASH (Dietary Approach to Stopping Hypertension) eating plan - Try to get at least 150 minutes of aerobic exercise per week - Aim to go on a brisk walk 30 minutes per day at least 5 days per week. If you're not active, gradually increase how long you walk by 5 minutes each week - limit alcohol: 2 standard drinks per day for men and 1 per day for women - avoid tobacco/nicotine products. Consider smoking cessation if you smoke - weight loss: 7% of current body weight can reduce your blood pressure by 5-10 points - follow-up at least every 6 months for your blood pressure. Follow-up sooner if your BP is not controlled

## 2019-02-25 ENCOUNTER — Encounter: Payer: Self-pay | Admitting: Physician Assistant

## 2019-02-25 DIAGNOSIS — N289 Disorder of kidney and ureter, unspecified: Secondary | ICD-10-CM

## 2019-02-25 HISTORY — DX: Disorder of kidney and ureter, unspecified: N28.9

## 2019-03-04 ENCOUNTER — Other Ambulatory Visit: Payer: Self-pay | Admitting: Physician Assistant

## 2019-03-04 DIAGNOSIS — I10 Essential (primary) hypertension: Secondary | ICD-10-CM

## 2019-03-26 ENCOUNTER — Other Ambulatory Visit: Payer: Self-pay | Admitting: Physician Assistant

## 2019-03-26 DIAGNOSIS — J309 Allergic rhinitis, unspecified: Secondary | ICD-10-CM

## 2019-04-07 ENCOUNTER — Other Ambulatory Visit: Payer: Self-pay | Admitting: Physician Assistant

## 2019-04-07 DIAGNOSIS — F3342 Major depressive disorder, recurrent, in full remission: Secondary | ICD-10-CM

## 2019-05-19 ENCOUNTER — Other Ambulatory Visit: Payer: Self-pay | Admitting: Physician Assistant

## 2019-05-19 DIAGNOSIS — J309 Allergic rhinitis, unspecified: Secondary | ICD-10-CM

## 2019-07-09 ENCOUNTER — Other Ambulatory Visit: Payer: Self-pay | Admitting: Physician Assistant

## 2019-07-09 DIAGNOSIS — J309 Allergic rhinitis, unspecified: Secondary | ICD-10-CM

## 2019-07-09 DIAGNOSIS — E785 Hyperlipidemia, unspecified: Secondary | ICD-10-CM

## 2019-08-31 ENCOUNTER — Ambulatory Visit: Payer: Medicare Other | Admitting: Physician Assistant

## 2019-09-01 ENCOUNTER — Ambulatory Visit: Payer: Medicare Other | Admitting: Physician Assistant

## 2019-09-02 ENCOUNTER — Encounter: Payer: Self-pay | Admitting: Physician Assistant

## 2019-09-02 ENCOUNTER — Ambulatory Visit (INDEPENDENT_AMBULATORY_CARE_PROVIDER_SITE_OTHER): Payer: Medicare Other | Admitting: Physician Assistant

## 2019-09-02 DIAGNOSIS — I10 Essential (primary) hypertension: Secondary | ICD-10-CM | POA: Diagnosis not present

## 2019-09-02 DIAGNOSIS — J309 Allergic rhinitis, unspecified: Secondary | ICD-10-CM

## 2019-09-02 DIAGNOSIS — E785 Hyperlipidemia, unspecified: Secondary | ICD-10-CM | POA: Diagnosis not present

## 2019-09-02 DIAGNOSIS — Z1331 Encounter for screening for depression: Secondary | ICD-10-CM

## 2019-09-02 MED ORDER — ATORVASTATIN CALCIUM 40 MG PO TABS: 40.0000 mg | ORAL_TABLET | Freq: Every day | ORAL | 1 refills | Status: DC

## 2019-09-02 MED ORDER — FLUTICASONE PROPIONATE 50 MCG/ACT NA SUSP: 1.0000 | Freq: Every day | NASAL | 5 refills | Status: DC

## 2019-09-02 MED ORDER — AMLODIPINE BESYLATE 10 MG PO TABS: 10.0000 mg | ORAL_TABLET | Freq: Every day | ORAL | 1 refills | Status: DC

## 2019-09-02 NOTE — Progress Notes (Signed)
Virtual Visit via Telephone Note  I connected with Lance Anderson on 09/02/19 at  8:10 AM EDT by telephone and verified that I am speaking with the correct person using two identifiers.   I discussed the limitations, risks, security and privacy concerns of performing an evaluation and management service by telephone and the availability of in person appointments. I also discussed with the patient that there may be a patient responsible charge related to this service. The patient expressed understanding and agreed to proceed.   History of Present Illness: HPI:                                                                Lance Anderson is a 68 y.o. male   CC: medication management  HTN: takingAmlodipine 10 mgdaily.Compliant with medications. He has ben monitoring his BP at home. BP range 120/70-135/80. Continues to endorse intermittent mild leg swelling when weather is warm; does not bother him. Denies vision change, headache, chest painwith exertion, orthopnea, lightheadedness,syncope. Risk factors include:age>55, male sex, HLD, fam hx  HLD: taking Atorvastatin. He reports occasional muscle cramps with vigorous exercise.  [facility] 09/02/2019 02/24/2019 05/29/2018 05/29/2018 02/25/2018  Decreased Interest 0 0 0 0 0  Down, Depressed, Hopeless 0 0 0 0 0  PHQ - 2 Score 0 0 0 0 0  Altered sleeping 0 1 1 1 1   Tired, decreased energy 0 0 1 1 0  Change in appetite 0 1 1 1 1   Feeling bad or failure about yourself  0 0 0 0 0  Trouble concentrating 0 0 0 0 0  Moving slowly or fidgety/restless 0 0 0 0 0  Suicidal thoughts 0 0 0 0 0  PHQ-9 Score 0 2 3 3 2   Difficult doing work/chores - - Not difficult at all - -    GAD 7 : Generalized Anxiety Score 09/02/2019 02/24/2019 05/29/2018  Nervous, Anxious, on Edge 0 0 0  Control/stop worrying 0 0 0  Worry too much - different things 0 0 0  Trouble relaxing 0 0 0  Restless 0 0 1  Easily annoyed or irritable 0 1 0  Afraid - awful might  happen 0 0 0  Total GAD 7 Score 0 1 1      Past Medical History:  Diagnosis Date  . Abdominal aortic ectasia (HCC) 03/12/2018  . Allergy   . Cataract   . DDD (degenerative disc disease), lumbar   . Depression   . Hyperlipidemia   . Hypertension   . RBBB   . Renal insufficiency 02/25/2019  . Vasovagal syncope    Past Surgical History:  Procedure Laterality Date  . CATARACT EXTRACTION, BILATERAL    . KNEE ARTHROSCOPY W/ MENISCAL REPAIR Left   . ROTATOR CUFF REPAIR Right   . WISDOM TOOTH EXTRACTION     Social History   Tobacco Use  . Smoking status: Passive Smoke Exposure - Never Smoker  . Smokeless tobacco: Never Used  Substance Use Topics  . Alcohol use: Yes    Alcohol/week: 4.0 standard drinks    Types: 4 Cans of beer per week   family history includes Heart attack in his brother and paternal grandfather; Heart disease in his father; Lung cancer in his father.  ROS: negative except as noted in the HPI  Medications: Current Outpatient Medications  Medication Sig Dispense Refill  . acetaminophen (TYLENOL) 650 MG CR tablet Take 1-2 tablets (650-1,300 mg total) by mouth every 8 (eight) hours as needed for pain. 90 tablet 3  . amLODipine (NORVASC) 10 MG tablet Take 1 tablet (10 mg total) by mouth daily. 90 tablet 1  . aspirin 81 MG EC tablet Take 1 tablet (81 mg total) by mouth daily. 30 tablet 11  . atorvastatin (LIPITOR) 40 MG tablet TAKE 1 TABLET BY MOUTH AT BEDTIME 90 tablet 0  . cetirizine (ZYRTEC) 5 MG tablet Take 1-2 tablets (5-10 mg total) by mouth daily. 30 tablet 0  . FLUoxetine (PROZAC) 20 MG capsule Take 1 capsule (20 mg total) by mouth daily. 90 capsule 1  . fluticasone (FLONASE) 50 MCG/ACT nasal spray Use 2 spray(s) in each nostril once daily 16 g 0  . naproxen (NAPROSYN) 500 MG tablet TAKE 1 TABLET BY MOUTH TWICE DAILY WITH A MEAL 60 tablet 0   No current facility-administered medications for this visit.    Allergies  Allergen Reactions  . Codeine  Other (See Comments)    Chest pain  . Paroxetine Other (See Comments)    Makes spacey  . Penicillins Diarrhea       Objective:  BP 140/83   Pulse 80   Temp 97.6 F (36.4 C) (Oral)   Wt 239 lb (108.4 kg)   BMI 33.33 kg/m   Wt Readings from Last 3 Encounters:  09/02/19 239 lb (108.4 kg)  02/24/19 242 lb (109.8 kg)  10/30/18 249 lb (112.9 kg)   Temp Readings from Last 3 Encounters:  09/02/19 97.6 F (36.4 C) (Oral)  08/05/18 97.7 F (36.5 C) (Oral)  07/17/18 97.9 F (36.6 C) (Oral)   BP Readings from Last 3 Encounters:  09/02/19 140/83  02/24/19 (!) 144/78  11/09/18 136/78   Pulse Readings from Last 3 Encounters:  09/02/19 80  02/24/19 62  11/09/18 (!) 57     Pulm: Normal work of breathing, normal phonation Neuro: alert and oriented x 3 Psych: cooperative, euthymic mood, affect mood-congruent, speech is articulate, normal rate and volume; thought processes clear and goal-directed, normal judgment, good insight   Lab Results  Component Value Date   CREATININE 1.00 02/24/2019   BUN 24 02/24/2019   NA 141 02/24/2019   K 4.4 02/24/2019   CL 108 02/24/2019   CO2 27 02/24/2019   Lab Results  Component Value Date   ALT 18 02/24/2019   AST 20 02/24/2019   ALKPHOS 68 08/04/2017   BILITOT 0.8 02/24/2019   Lab Results  Component Value Date   WBC 6.9 02/25/2018   HGB 14.0 02/25/2018   HCT 40.3 02/25/2018   MCV 91.2 02/25/2018   PLT 254 02/25/2018   Lab Results  Component Value Date   CHOL 144 02/24/2019   HDL 51 02/24/2019   LDLCALC 80 02/24/2019   TRIG 53 02/24/2019   CHOLHDL 2.8 02/24/2019   The 10-year ASCVD risk score Mikey Bussing DC Jr., et al., 2013) is: 14.8%   Values used to calculate the score:     Age: 89 years     Sex: Male     Is Non-Hispanic African American: No     Diabetic: No     Tobacco smoker: No     Systolic Blood Pressure: 892 mmHg     Is BP treated: Yes     HDL Cholesterol: 51 mg/dL  Total Cholesterol: 144 mg/dL   No  results found for this or any previous visit (from the past 72 hour(s)). No results found.    Assessment and Plan: 68 y.o. male with   .Diagnoses and all orders for this visit:  Allergic rhinitis, unspecified seasonality, unspecified trigger -     fluticasone (FLONASE) 50 MCG/ACT nasal spray; Place 1 spray into both nostrils daily.  Essential hypertension with goal blood pressure less than 140/90 -     amLODipine (NORVASC) 10 MG tablet; Take 1 tablet (10 mg total) by mouth daily.  Dyslipidemia -     atorvastatin (LIPITOR) 40 MG tablet; Take 1 tablet (40 mg total) by mouth at bedtime.   Renal function and fasting lipids completed 02/2019 and reviewed again today. No concerns 10 yr ASCVD risk 14.8%, cont baby asa and statin for primary prevention BP goal <130/80, home reading slightly elevated today, but patient reports missing a couple days of medication, otherwise home readings in range. Cont Amlodipine 10 mg  Follow-up in 6 months or sooner as needed   Follow Up Instructions:    I discussed the assessment and treatment plan with the patient. The patient was provided an opportunity to ask questions and all were answered. The patient agreed with the plan and demonstrated an understanding of the instructions.   The patient was advised to call back or seek an in-person evaluation if the symptoms worsen or if the condition fails to improve as anticipated.  I provided 5-10 minutes of non-face-to-face time during this encounter.   Carlis Stable, [place]

## 2019-09-07 ENCOUNTER — Other Ambulatory Visit: Payer: Self-pay | Admitting: Physician Assistant

## 2019-09-07 DIAGNOSIS — M5137 Other intervertebral disc degeneration, lumbosacral region: Secondary | ICD-10-CM

## 2019-09-20 ENCOUNTER — Ambulatory Visit (INDEPENDENT_AMBULATORY_CARE_PROVIDER_SITE_OTHER): Payer: Medicare Other | Admitting: *Deleted

## 2019-09-20 VITALS — BP 137/76 | HR 62 | Ht 71.0 in | Wt 235.0 lb

## 2019-09-20 DIAGNOSIS — Z0001 Encounter for general adult medical examination with abnormal findings: Secondary | ICD-10-CM | POA: Diagnosis not present

## 2019-09-20 DIAGNOSIS — Z0000 Encounter for general adult medical examination without abnormal findings: Secondary | ICD-10-CM

## 2019-09-20 DIAGNOSIS — J309 Allergic rhinitis, unspecified: Secondary | ICD-10-CM | POA: Diagnosis not present

## 2019-09-20 MED ORDER — FLUTICASONE PROPIONATE 50 MCG/ACT NA SUSP: 2.0000 | Freq: Every day | NASAL | 5 refills | Status: DC

## 2019-09-20 NOTE — Progress Notes (Signed)
Subjective:   Lance Anderson is a 68 y.o. male who presents for Medicare Annual/Subsequent preventive examination.  Review of Systems:  No ROS.  Medicare Wellness Virtual Visit.  Visual/audio telehealth visit, UTA vital signs.   See social history for additional risk factors.    Cardiac Risk Factors include: advanced age (>72men, >40 women);dyslipidemia;hypertension;male gender Sleep patterns: Getting on average 7 hours of sleep a night. Wakes up 1 time to void during the night. Wakes up feeling refreshed and ready for the day Home Safety/Smoke Alarms: Feels safe in home. Smoke alarms in place.  Living environment; Lives with wife in a 1 story home and stairs have handrails on  Them. Shower is a walk in shower and grab bars in place. Seat Belt Safety/Bike Helmet: Wears seat belt.    Male:   CCS-  UTD   PSA- No results found for: PSA pt declined       Objective:    Vitals: BP 137/76   Pulse 62   Ht 5\' 11"  (1.803 m)   Wt 235 lb (106.6 kg)   BMI 32.78 kg/m   Body mass index is 32.78 kg/m.  Advanced Directives 09/20/2019 11/04/2018 07/29/2018  Does Patient Have a Medical Advance Directive? No No No  Would patient like information on creating a medical advance directive? No - Patient declined No - Patient declined No - Patient declined    Tobacco Social History   Tobacco Use  Smoking Status Passive Smoke Exposure - Never Smoker  Smokeless Tobacco Never Used     Counseling given: Not Answered   Clinical Intake:  Pre-visit preparation completed: Yes  Pain : No/denies pain     Nutritional Risks: None Diabetes: No  How often do you need to have someone help you when you read instructions, pamphlets, or other written materials from your doctor or pharmacy?: 1 - Never What is the last grade level you completed in school?: 12  Interpreter Needed?: No  Information entered by :: Orlie Dakin, LPN  Past Medical History:  Diagnosis Date  . Abdominal aortic ectasia  ([place]) 03/12/2018  . Allergy   . Cataract   . DDD (degenerative disc disease), lumbar   . Depression   . Hyperlipidemia   . Hypertension   . RBBB   . Renal insufficiency 02/25/2019  . Vasovagal syncope    Past Surgical History:  Procedure Laterality Date  . CATARACT EXTRACTION, BILATERAL    . KNEE ARTHROSCOPY W/ MENISCAL REPAIR Left   . ROTATOR CUFF REPAIR Right   . WISDOM TOOTH EXTRACTION     Family History  Problem Relation Age of Onset  . Heart disease Father   . Lung cancer Father   . Heart attack Brother   . Heart attack Paternal Grandfather    Social History   Socioeconomic History  . Marital status: Married    Spouse name: Zigmund Daniel  . Number of children: 1  . Years of education: 52  . Highest education level: 12th grade  Occupational History  . Occupation: Dealer    Comment: retired  Scientific laboratory technician  . Financial resource strain: Not hard at all  . Food insecurity    Worry: Never true    Inability: Never true  . Transportation needs    Medical: No    Non-medical: No  Tobacco Use  . Smoking status: Passive Smoke Exposure - Never Smoker  . Smokeless tobacco: Never Used  Substance and Sexual Activity  . Alcohol use: Yes    Alcohol/week:  4.0 standard drinks    Types: 4 Cans of beer per week  . Drug use: No  . Sexual activity: Yes  Lifestyle  . Physical activity    Days per week: 7 days    Minutes per session: 20 min  . Stress: Not at all  Relationships  . Social connections    Talks on phone: More than three times a week    Gets together: Twice a week    Attends religious service: More than 4 times per year    Active member of club or organization: No    Attends meetings of clubs or organizations: Never    Relationship status: Married  Other Topics Concern  . Not on file  Social History Narrative   Works part time driving a Diplomatic Services operational officertruck   Plays with grandkids in the yard   Walks his dog 1 mile everyday    Outpatient Encounter Medications as of 09/20/2019   Medication Sig  . amLODipine (NORVASC) 10 MG tablet Take 1 tablet (10 mg total) by mouth daily.  Marland Kitchen. aspirin 81 MG EC tablet Take 1 tablet (81 mg total) by mouth daily.  Marland Kitchen. atorvastatin (LIPITOR) 40 MG tablet Take 1 tablet (40 mg total) by mouth at bedtime.  . cetirizine (ZYRTEC) 5 MG tablet Take 1-2 tablets (5-10 mg total) by mouth daily.  Marland Kitchen. FLUoxetine (PROZAC) 20 MG capsule Take 1 capsule (20 mg total) by mouth daily.  . fluticasone (FLONASE) 50 MCG/ACT nasal spray Place 1 spray into both nostrils daily. (Patient taking differently: Place 2 sprays into both nostrils daily. )  . naproxen (NAPROSYN) 500 MG tablet Take 1 tablet (500 mg total) by mouth 2 (two) times daily as needed for moderate pain.  Marland Kitchen. acetaminophen (TYLENOL) 650 MG CR tablet Take 1-2 tablets (650-1,300 mg total) by mouth every 8 (eight) hours as needed for pain.   No facility-administered encounter medications on file as of 09/20/2019.     Activities of Daily Living In your present state of health, do you have any difficulty performing the following activities: 09/20/2019  Hearing? N  Vision? N  Difficulty concentrating or making decisions? N  Walking or climbing stairs? N  Dressing or bathing? N  Doing errands, shopping? N  Preparing Food and eating ? N  Using the Toilet? N  In the past six months, have you accidently leaked urine? N  Do you have problems with loss of bowel control? N  Managing your Medications? N  Managing your Finances? N  Housekeeping or managing your Housekeeping? N  Some recent data might be hidden    Patient Care Team: Riki Ruskummings, Charley Elizabeth, PA-C as PCP - General (Physician Assistant)   Assessment:   This is a routine wellness examination for [name].Physical assessment deferred to PCP.   Exercise Activities and Dietary recommendations Current Exercise Habits: Home exercise routine, Type of exercise: walking, Time (Minutes): 20, Frequency (Times/Week): 7, Weekly Exercise (Minutes/Week):  140, Intensity: Mild, Exercise limited by: None identified Diet Eats out a lot and wife cooks some as well. Breakfast: sausage and egg mcmuffin Lunch: sandwich Dinner: casseroles, soups or goes out Drinks very little water daily so encouraged to increase water intake.      Goals    . Weight (lb) < 200 lb (90.7 kg)     Patient states would like to loose 15lbs       Fall Risk Fall Risk  09/20/2019 02/24/2019 05/29/2018  Falls in the past year? 0 1 Yes  Number falls  in past yr: 0 1 2 or more  Injury with Fall? 0 0 Yes  Risk Factor Category  - - High Fall Risk  Risk for fall due to : - History of fall(s) History of fall(s)  Follow up Falls prevention discussed - Falls evaluation completed;Education provided   Is the patient's home free of loose throw rugs in walkways, pet beds, electrical cords, etc?   yes      Grab bars in the bathroom? yes      Handrails on the stairs?   yes      Adequate lighting?   yes   Depression Screen PHQ 2/9 Scores 09/20/2019 09/02/2019 02/24/2019 05/29/2018  PHQ - 2 Score 0 0 0 0  PHQ- 9 Score 0 0 2 3    Cognitive Function     6CIT Screen 09/20/2019 05/29/2018  What Year? 0 points 0 points  What month? 0 points 0 points  What time? 0 points 0 points  Count back from 20 0 points 0 points  Months in reverse 0 points 0 points  Repeat phrase 2 points 0 points  Total Score 2 0    Immunization History  Administered Date(s) Administered  . Td 10/09/2002  . Tdap 12/30/2012  . Zoster 08/10/2013    Screening Tests Health Maintenance  Topic Date Due  . INFLUENZA VACCINE  03/22/2020 (Originally 07/24/2019)  . PNA vac Low Risk Adult (1 of 2 - PCV13) 09/01/2020 (Originally 01/16/2018)  . TETANUS/TDAP  12/30/2022  . COLONOSCOPY  06/04/2024  . Hepatitis C Screening  Completed        Plan:      Mr. Teutsch , Thank you for taking time to come for your Medicare Wellness Visit. I appreciate your ongoing commitment to your health goals. Please review the  following plan we discussed and let me know if I can assist you in the future.  Please schedule your next medicare wellness visit with me in 1 yr.   These are the goals we discussed: Goals    . Weight (lb) < 200 lb (90.7 kg)     Patient states would like to loose 15lbs       This is a list of the screening recommended for you and due dates:  Health Maintenance  Topic Date Due  . Flu Shot  03/22/2020*  . Pneumonia vaccines (1 of 2 - PCV13) 09/01/2020*  . Tetanus Vaccine  12/30/2022  . Colon Cancer Screening  06/04/2024  .  Hepatitis C: One time screening is recommended by Center for Disease Control  (CDC) for  adults born from [birth year] through 1963.   Completed  *Topic was postponed. The date shown is not the original due date.     I have personally reviewed and noted the following in the patient's chart:   . Medical and social history . Use of alcohol, tobacco or illicit drugs  . Current medications and supplements . Functional ability and status . Nutritional status . Physical activity . Advanced directives . List of other physicians . Hospitalizations, surgeries, and ER visits in previous 12 months . Vitals . Screenings to include cognitive, depression, and falls . Referrals and appointments  In addition, I have reviewed and discussed with patient certain preventive protocols, quality metrics, and best practice recommendations. A written personalized care plan for preventive services as well as general preventive health recommendations were provided to patient.     SYMIR MAH, LPN  [id number]

## 2019-09-20 NOTE — Patient Instructions (Addendum)
Mr. Konigsberg , Thank you for taking time to come for your Medicare Wellness Visit. I appreciate your ongoing commitment to your health goals. Please review the following plan we discussed and let me know if I can assist you in the future.  Please schedule your next medicare wellness visit with me in 1 yr.  These are the goals we discussed: Goals    . Weight (lb) < 200 lb (90.7 kg)     Patient states would like to loose 15lbs

## 2019-09-23 DIAGNOSIS — M542 Cervicalgia: Secondary | ICD-10-CM | POA: Diagnosis not present

## 2019-09-23 DIAGNOSIS — E785 Hyperlipidemia, unspecified: Secondary | ICD-10-CM | POA: Diagnosis not present

## 2019-09-23 DIAGNOSIS — Z7982 Long term (current) use of aspirin: Secondary | ICD-10-CM | POA: Diagnosis not present

## 2019-09-23 DIAGNOSIS — Z880 Allergy status to penicillin: Secondary | ICD-10-CM | POA: Diagnosis not present

## 2019-09-23 DIAGNOSIS — I10 Essential (primary) hypertension: Secondary | ICD-10-CM | POA: Diagnosis not present

## 2019-09-23 DIAGNOSIS — S161XXA Strain of muscle, fascia and tendon at neck level, initial encounter: Secondary | ICD-10-CM | POA: Diagnosis not present

## 2019-09-23 DIAGNOSIS — Z79899 Other long term (current) drug therapy: Secondary | ICD-10-CM | POA: Diagnosis not present

## 2019-09-23 DIAGNOSIS — Z888 Allergy status to other drugs, medicaments and biological substances status: Secondary | ICD-10-CM | POA: Diagnosis not present

## 2019-10-20 ENCOUNTER — Other Ambulatory Visit: Payer: Self-pay | Admitting: Physician Assistant

## 2019-10-20 DIAGNOSIS — F3342 Major depressive disorder, recurrent, in full remission: Secondary | ICD-10-CM

## 2019-12-20 ENCOUNTER — Ambulatory Visit (INDEPENDENT_AMBULATORY_CARE_PROVIDER_SITE_OTHER): Payer: Medicare Other | Admitting: Medical-Surgical

## 2019-12-20 ENCOUNTER — Encounter: Payer: Self-pay | Admitting: Medical-Surgical

## 2019-12-20 VITALS — Temp 100.2°F | Wt 235.0 lb

## 2019-12-20 DIAGNOSIS — U071 COVID-19: Secondary | ICD-10-CM

## 2019-12-20 DIAGNOSIS — M5137 Other intervertebral disc degeneration, lumbosacral region: Secondary | ICD-10-CM

## 2019-12-20 MED ORDER — CYCLOBENZAPRINE HCL 10 MG PO TABS: 10.0000 mg | ORAL_TABLET | Freq: Three times a day (TID) | ORAL | 0 refills | Status: DC | PRN

## 2019-12-20 MED ORDER — LIDOCAINE 5 % EX PTCH: 1.0000 | MEDICATED_PATCH | CUTANEOUS | 0 refills | Status: DC

## 2019-12-20 MED ORDER — PREDNISONE 50 MG PO TABS: ORAL_TABLET | ORAL | 0 refills | Status: DC

## 2019-12-20 NOTE — Assessment & Plan Note (Signed)
Prednisone burst dose for 5 days.  May use lidocaine patch as needed if desired.  Flexeril 3 times daily as needed.  Continue using Tylenol and conservative measures such as heat, ice, massage, etc.

## 2019-12-20 NOTE — Progress Notes (Signed)
Virtual Visit via Video Note  I connected with Lance Anderson on 12/20/19 at  2:20 PM EST by a video enabled telemedicine application and verified that I am speaking with the correct person using two identifiers.   I discussed the limitations of evaluation and management by telemedicine and the availability of in person appointments. The patient expressed understanding and agreed to proceed.  Subjective:    CC: Low back pain  HPI:  Very pleasant 68 year old male presenting with complaints of increased low back pain since being diagnosed with Covid on December 21.  Previous history of low back pain with degenerative disc disease and discectomy.  Over the last couple of days pain has worsened with no pain at rest and pain rated 6 out of 10 with activity and bending over.  Pain described as sharp localized to lower back without radiation.  Continues to run fevers of 100.2 as of today.  Denies shortness of breath, headache, cough, and runny nose.  Does report stools are more watery than usual but denies diarrhea.  Has been taking naproxen 500 mg twice daily with minimal relief.  Also using Tylenol for fever and back pain but has not helped much.   Past medical history, Surgical history, Family history not pertinant except as noted below, Social history, Allergies, and medications have been entered into the medical record, reviewed, and corrections made.   Review of Systems: No fevers, chills, night sweats, weight loss, chest pain, or shortness of breath.   Objective:    General: Speaking clearly in complete sentences without any shortness of breath.  Alert and oriented x3.  Normal judgment. No apparent acute distress.    Impression and Recommendations:    DDD (degenerative disc disease), lumbosacral Prednisone burst dose for 5 days.  May use lidocaine patch as needed if desired.  Flexeril 3 times daily as needed.  Continue using Tylenol and conservative measures such as heat, ice, massage,  etc.  COVID-19 virus infection Continue using Tylenol as needed fever.  Monitor for worsening signs of symptoms or development of shortness of breath.  Prednisone burst dosing as described above.   Return if symptoms worsen or fail to improve.    I discussed the assessment and treatment plan with the patient. The patient was provided an opportunity to ask questions and all were answered. The patient agreed with the plan and demonstrated an understanding of the instructions.   The patient was advised to call back or seek an in-person evaluation if the symptoms worsen or if the condition fails to improve as anticipated.   Clearnce Sorrel, DNP, APRN, FNP-BC  25 minutes of non-face-to-face time was provided during this encounter.

## 2019-12-20 NOTE — Assessment & Plan Note (Signed)
Continue using Tylenol as needed fever.  Monitor for worsening signs of symptoms or development of shortness of breath.  Prednisone burst dosing as described above.

## 2020-01-23 ENCOUNTER — Emergency Department (INDEPENDENT_AMBULATORY_CARE_PROVIDER_SITE_OTHER)
Admission: EM | Admit: 2020-01-23 | Discharge: 2020-01-23 | Disposition: A | Discharge status: Home / Self Care | Payer: Medicare Other | Source: Home / Self Care | Attending: Family Medicine | Admitting: Family Medicine

## 2020-01-23 ENCOUNTER — Other Ambulatory Visit: Payer: Self-pay

## 2020-01-23 DIAGNOSIS — M26621 Arthralgia of right temporomandibular joint: Secondary | ICD-10-CM | POA: Diagnosis not present

## 2020-01-23 NOTE — ED Provider Notes (Signed)
[facility]    CSN: [id number] Arrival date & time: 01/23/20  1131      History   Chief Complaint Chief Complaint  Patient presents with  . Otalgia    RT    HPI Lance Anderson is a 69 y.o. male.   Patient complains of mild right ear pain for about 3 weeks.  The symptom began after he recovered from [place].  He has had mild pain when chewing, and denies fever and nasal congestion.  The history is provided by the patient.    Past Medical History:  Diagnosis Date  . Abdominal aortic ectasia ([place]) 03/12/2018  . Allergy   . Cataract   . DDD (degenerative disc disease), lumbar   . Depression   . Hyperlipidemia   . Hypertension   . RBBB   . Renal insufficiency 02/25/2019  . Vasovagal syncope     Patient Active Problem List   Diagnosis Date Noted  . COVID-19 virus infection 12/20/2019  . Renal insufficiency 02/25/2019  . 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 Cervical radiculopathy 10/30/2018  . Elevated blood pressure reading in office with diagnosis of hypertension 05/29/2018  . Right bundle branch block (RBBB) 05/29/2018  . Sinus bradycardia 05/29/2018  . Peripheral edema 04/27/2018  . Venous stasis dermatitis of right lower extremity 04/27/2018  . Strain of calf muscle 04/27/2018  . Abdominal aortic ectasia ([place]) 03/12/2018  . 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 Recurrent major depressive disorder, in full remission ([place]) 02/25/2018  . Encounter for long-term (current) use of medications 02/25/2018  . Encounter for abdominal aortic aneurysm (AAA) screening 02/25/2018  . Dyslipidemia, goal LDL below 100 02/25/2018  . Refused influenza vaccine 02/25/2018  . Refused pneumococcal vaccination 02/25/2018  . Pruritus 08/04/2017  . Encounter for monitoring statin therapy 08/04/2017  . Depression 05/21/2017  . Family history of heart disease in male family member before age 45 05/21/2017  . Tear of meniscus of knee  05/21/2017  . Class 1 obesity due to excess calories with serious comorbidity in adult 05/21/2017  . Allergic rhinitis 05/21/2017  . DDD (degenerative disc disease), lumbosacral 05/21/2017  . Vasovagal episode 05/21/2017  . Essential hypertension with goal blood pressure less than 140/90 08/17/2015  . Pseudophakia of both eyes 10/20/2013  . Dyslipidemia 08/10/2013    Past Surgical History:  Procedure Laterality Date  . CATARACT EXTRACTION, BILATERAL    . KNEE ARTHROSCOPY W/ MENISCAL REPAIR Left   . ROTATOR CUFF REPAIR Right   . WISDOM TOOTH EXTRACTION         Home Medications    Prior to Admission medications   Medication Sig Start Date End Date Taking? Authorizing Provider  acetaminophen (TYLENOL) 650 MG CR tablet Take 1-2 tablets (650-1,300 mg total) by mouth every 8 (eight) hours as needed for pain. 02/24/19   Trixie Dredge, PA-C  amLODipine (NORVASC) 10 MG tablet Take 1 tablet (10 mg total) by mouth daily. 09/02/19   Trixie Dredge, PA-C  aspirin 81 MG EC tablet Take 1 tablet (81 mg total) by mouth daily. 02/25/18   Trixie Dredge, PA-C  atorvastatin (LIPITOR) 40 MG tablet Take 1 tablet (40 mg total) by mouth at bedtime. 09/02/19   Trixie Dredge, PA-C  cetirizine (ZYRTEC) 5 MG tablet Take 1-2 tablets (5-10 mg total) by mouth daily. 08/05/18   Noe Gens, PA-C  cyclobenzaprine (FLEXERIL) 10 MG tablet Take 1 tablet (10 mg total) by mouth 3 (three) times daily as needed  for muscle spasms. 12/20/19   Christen Butter, NP  FLUoxetine (PROZAC) 20 MG capsule Take 1 capsule by mouth once daily 10/20/19   Breeback, Jade L, PA-C  fluticasone (FLONASE) 50 MCG/ACT nasal spray Place 2 sprays into both nostrils daily. 09/20/19   Agapito Games, MD  lidocaine (LIDODERM) 5 % Place 1 patch onto the skin daily. Remove & Discard patch within 12 hours or as directed by MD 12/20/19   Christen Butter, NP  naproxen (NAPROSYN) 500 MG tablet Take 1 tablet  (500 mg total) by mouth 2 (two) times daily as needed for moderate pain. 09/10/19   Carlis Stable, PA-C  predniSONE (DELTASONE) 50 MG tablet Take 1 tab by mouth daily for 5 days. 12/20/19   Christen Butter, NP    Family History Family History  Problem Relation Age of Onset  . Heart disease Father   . Lung cancer Father   . Heart attack Brother   . Heart attack Paternal Grandfather     Social History Social History   Tobacco Use  . Smoking status: Passive Smoke Exposure - Never Smoker  . Smokeless tobacco: Never Used  Substance Use Topics  . Alcohol use: Yes    Alcohol/week: 4.0 standard drinks    Types: 4 Cans of beer per week  . Drug use: No     Allergies   Codeine, Paroxetine, and Penicillins   Review of Systems Review of Systems No sore throat No cough No pleuritic pain No wheezing No nasal congestion No post-nasal drainage No sinus pain/pressure No itchy/red eyes + right earache No hemoptysis No SOB No fever/chills No nausea No vomiting No abdominal pain No diarrhea No urinary symptoms No skin rash No fatigue No myalgias No headache    Physical Exam Triage Vital Signs ED Triage Vitals  Enc Vitals Group     BP 01/23/20 1230 (!) 154/79     Pulse Rate 01/23/20 1230 66     Resp 01/23/20 1230 18     Temp 01/23/20 1230 97.9 F (36.6 C)     Temp Source 01/23/20 1230 Oral     SpO2 01/23/20 1230 96 %     Weight 01/23/20 1231 235 lb (106.6 kg)     Height 01/23/20 1231 5\' 11"  (1.803 m)     Head Circumference --      Peak Flow --      Pain Score 01/23/20 1231 2     Pain Loc --      Pain Edu? --      Excl. in GC? --    No data found.  Updated Vital Signs BP (!) 154/79 (BP Location: Left Arm)   Pulse 66   Temp 97.9 F (36.6 C) (Oral)   Resp 18   Ht 5\' 11"  (1.803 m)   Wt 106.6 kg   SpO2 96%   BMI 32.78 kg/m   Visual Acuity Right Eye Distance:   Left Eye Distance:   Bilateral Distance:    Right Eye Near:   Left Eye Near:      Bilateral Near:     Physical Exam Vitals and nursing note reviewed.  Constitutional:      General: He is not in acute distress. HENT:     Head: Normocephalic.     Right Ear: Tympanic membrane, ear canal and external ear normal.     Left Ear: Tympanic membrane, ear canal and external ear normal.     Ears:     Comments: There is  distinct tenderness over the right temporomandibular joint.  Palpation there recreates his pain.     Nose: Nose normal.     Mouth/Throat:     Pharynx: Oropharynx is clear.  Eyes:     Conjunctiva/sclera: Conjunctivae normal.     Pupils: Pupils are equal, round, and reactive to light.  Cardiovascular:     Rate and Rhythm: Normal rate.  Pulmonary:     Effort: Pulmonary effort is normal.  Lymphadenopathy:     Cervical: No cervical adenopathy.  Skin:    General: Skin is warm and dry.     Findings: No rash.  Neurological:     Mental Status: He is alert.       UC Treatments / Results  Labs (all labs ordered are listed, but only abnormal results are displayed) Labs Reviewed - No data to display  EKG   Radiology No results found.  Procedures Procedures (including critical care time)  Medications Ordered in UC Medications - No data to display  Initial Impression / Assessment and Plan / UC Course  I have reviewed the triage vital signs and the nursing notes.  Pertinent labs & imaging results that were available during my care of the patient were reviewed by me and considered in my medical decision making (see chart for details).    There is no evidence of bacterial infection today.   Recommend follow-up with ENT if not improved about two weeks.   Final Clinical Impressions(s) / UC Diagnoses   Final diagnoses:  Arthralgia of right temporomandibular joint     Discharge Instructions     May take Naproxen twice daily.  Try applying ice pack for 15 to 20 minutes, 3 to 4 times daily  Continue until pain decreases.     ED Prescriptions     None        Lattie Haw, MD 01/25/20 1900

## 2020-01-23 NOTE — ED Triage Notes (Signed)
Pt c/o RT ear pain. Having issues for 3 weeks. Started after getting over covid. Has not tried any OTC meds. Pain 2/10

## 2020-01-23 NOTE — Discharge Instructions (Addendum)
May take Naproxen twice daily.  Try applying ice pack for 15 to 20 minutes, 3 to 4 times daily  Continue until pain decreases.

## 2020-03-07 DIAGNOSIS — H93299 Other abnormal auditory perceptions, unspecified ear: Secondary | ICD-10-CM | POA: Diagnosis not present

## 2020-03-07 DIAGNOSIS — H838X3 Other specified diseases of inner ear, bilateral: Secondary | ICD-10-CM | POA: Diagnosis not present

## 2020-03-07 DIAGNOSIS — H6121 Impacted cerumen, right ear: Secondary | ICD-10-CM | POA: Diagnosis not present

## 2020-03-07 DIAGNOSIS — H903 Sensorineural hearing loss, bilateral: Secondary | ICD-10-CM | POA: Diagnosis not present

## 2020-03-07 DIAGNOSIS — H9201 Otalgia, right ear: Secondary | ICD-10-CM | POA: Diagnosis not present

## 2020-04-06 ENCOUNTER — Telehealth: Payer: Self-pay | Admitting: Medical-Surgical

## 2020-04-06 ENCOUNTER — Other Ambulatory Visit: Payer: Self-pay | Admitting: Physician Assistant

## 2020-04-06 DIAGNOSIS — I10 Essential (primary) hypertension: Secondary | ICD-10-CM

## 2020-04-06 NOTE — Telephone Encounter (Signed)
Please call patient and have him establish with a new PCP to get further refills of his BP medication.

## 2020-04-17 ENCOUNTER — Encounter: Payer: Self-pay | Admitting: Medical-Surgical

## 2020-04-17 ENCOUNTER — Other Ambulatory Visit: Payer: Self-pay

## 2020-04-17 ENCOUNTER — Ambulatory Visit (INDEPENDENT_AMBULATORY_CARE_PROVIDER_SITE_OTHER): Payer: Medicare Other | Admitting: Medical-Surgical

## 2020-04-17 VITALS — BP 147/91 | HR 56 | Temp 97.9°F | Ht 71.0 in | Wt 241.2 lb

## 2020-04-17 DIAGNOSIS — E785 Hyperlipidemia, unspecified: Secondary | ICD-10-CM | POA: Diagnosis not present

## 2020-04-17 DIAGNOSIS — M159 Polyosteoarthritis, unspecified: Secondary | ICD-10-CM

## 2020-04-17 DIAGNOSIS — M8949 Other hypertrophic osteoarthropathy, multiple sites: Secondary | ICD-10-CM | POA: Diagnosis not present

## 2020-04-17 DIAGNOSIS — F3342 Major depressive disorder, recurrent, in full remission: Secondary | ICD-10-CM | POA: Diagnosis not present

## 2020-04-17 DIAGNOSIS — J309 Allergic rhinitis, unspecified: Secondary | ICD-10-CM

## 2020-04-17 DIAGNOSIS — I10 Essential (primary) hypertension: Secondary | ICD-10-CM | POA: Diagnosis not present

## 2020-04-17 LAB — LIPID PANEL W/REFLEX DIRECT LDL
Cholesterol: 169 mg/dL (ref <200)
HDL: 51 mg/dL (ref >=40)
LDL Cholesterol (Calc): 104 mg/dL (calc) — ABNORMAL HIGH
Non-HDL Cholesterol (Calc): 118 mg/dL (calc) (ref <130)
Total CHOL/HDL Ratio: 3.3 (calc) (ref <5.0)
Triglycerides: 47 mg/dL (ref <150)

## 2020-04-17 LAB — COMPLETE METABOLIC PANEL WITH GFR
AG Ratio: 1.4 (calc) (ref 1.0–2.5)
ALT: 15 U/L (ref 9–46)
AST: 18 U/L (ref 10–35)
Albumin: 4 g/dL (ref 3.6–5.1)
Alkaline phosphatase (APISO): 91 U/L (ref 35–144)
BUN/Creatinine Ratio: 30 (calc) — ABNORMAL HIGH (ref 6–22)
BUN: 30 mg/dL — ABNORMAL HIGH (ref 7–25)
CO2: 26 mmol/L (ref 20–32)
Calcium: 8.9 mg/dL (ref 8.6–10.3)
Chloride: 107 mmol/L (ref 98–110)
Creat: 1.01 mg/dL (ref 0.70–1.25)
GFR, Est African American: 89 mL/min/{1.73_m2} (ref >=60)
GFR, Est Non African American: 77 mL/min/{1.73_m2} (ref >=60)
Globulin: 2.9 g/dL (calc) (ref 1.9–3.7)
Glucose, Bld: 97 mg/dL (ref 65–99)
Potassium: 4.5 mmol/L (ref 3.5–5.3)
Sodium: 140 mmol/L (ref 135–146)
Total Bilirubin: 0.4 mg/dL (ref 0.2–1.2)
Total Protein: 6.9 g/dL (ref 6.1–8.1)

## 2020-04-17 LAB — CBC
HCT: 43.1 % (ref 38.5–50.0)
Hemoglobin: 14.7 g/dL (ref 13.2–17.1)
MCH: 31.8 pg (ref 27.0–33.0)
MCHC: 34.1 g/dL (ref 32.0–36.0)
MCV: 93.3 fL (ref 80.0–100.0)
MPV: 10.8 fL (ref 7.5–12.5)
Platelets: 254 10*3/uL (ref 140–400)
RBC: 4.62 10*6/uL (ref 4.20–5.80)
RDW: 12.2 % (ref 11.0–15.0)
WBC: 6.5 10*3/uL (ref 3.8–10.8)

## 2020-04-17 MED ORDER — AMLODIPINE BESYLATE 10 MG PO TABS: 10.0000 mg | ORAL_TABLET | Freq: Every day | ORAL | 1 refills | Status: DC

## 2020-04-17 MED ORDER — FLUTICASONE PROPIONATE 50 MCG/ACT NA SUSP: 2.0000 | Freq: Every day | NASAL | 6 refills | Status: DC

## 2020-04-17 NOTE — Patient Instructions (Signed)
DASH Eating Plan DASH stands for "Dietary Approaches to Stop Hypertension." The DASH eating plan is a healthy eating plan that has been shown to reduce high blood pressure (hypertension). It may also reduce your risk for type 2 diabetes, heart disease, and stroke. The DASH eating plan may also help with weight loss. What are tips for following this plan?  General guidelines  Avoid eating more than 2,300 mg (milligrams) of salt (sodium) a day. If you have hypertension, you may need to reduce your sodium intake to 1,500 mg a day.  Limit alcohol intake to no more than 1 drink a day for nonpregnant women and 2 drinks a day for men. One drink equals 12 oz of beer, 5 oz of wine, or 1 oz of hard liquor.  Work with your health care provider to maintain a healthy body weight or to lose weight. Ask what an ideal weight is for you.  Get at least 30 minutes of exercise that causes your heart to beat faster (aerobic exercise) most days of the week. Activities may include walking, swimming, or biking.  Work with your health care provider or diet and nutrition specialist (dietitian) to adjust your eating plan to your individual calorie needs. Reading food labels   Check food labels for the amount of sodium per serving. Choose foods with less than 5 percent of the Daily Value of sodium. Generally, foods with less than 300 mg of sodium per serving fit into this eating plan.  To find whole grains, look for the word "whole" as the first word in the ingredient list. Shopping  Buy products labeled as "low-sodium" or "no salt added."  Buy fresh foods. Avoid canned foods and premade or frozen meals. Cooking  Avoid adding salt when cooking. Use salt-free seasonings or herbs instead of table salt or sea salt. Check with your health care provider or pharmacist before using salt substitutes.  Do not fry foods. Cook foods using healthy methods such as baking, boiling, grilling, and broiling instead.  Cook with  heart-healthy oils, such as olive, canola, soybean, or sunflower oil. Meal planning  Eat a balanced diet that includes: ? 5 or more servings of fruits and vegetables each day. At each meal, try to fill half of your plate with fruits and vegetables. ? Up to 6-8 servings of whole grains each day. ? Less than 6 oz of lean meat, poultry, or fish each day. A 3-oz serving of meat is about the same size as a deck of cards. One egg equals 1 oz. ? 2 servings of low-fat dairy each day. ? A serving of nuts, seeds, or beans 5 times each week. ? Heart-healthy fats. Healthy fats called Omega-3 fatty acids are found in foods such as flaxseeds and coldwater fish, like sardines, salmon, and mackerel.  Limit how much you eat of the following: ? Canned or prepackaged foods. ? Food that is high in trans fat, such as fried foods. ? Food that is high in saturated fat, such as fatty meat. ? Sweets, desserts, sugary drinks, and other foods with added sugar. ? Full-fat dairy products.  Do not salt foods before eating.  Try to eat at least 2 vegetarian meals each week.  Eat more home-cooked food and less restaurant, buffet, and fast food.  When eating at a restaurant, ask that your food be prepared with less salt or no salt, if possible. What foods are recommended? The items listed may not be a complete list. Talk with your dietitian about   what dietary choices are best for you. Grains Whole-grain or whole-wheat bread. Whole-grain or whole-wheat pasta. Brown rice. Oatmeal. Quinoa. Bulgur. Whole-grain and low-sodium cereals. Pita bread. Low-fat, low-sodium crackers. Whole-wheat flour tortillas. Vegetables Fresh or frozen vegetables (raw, steamed, roasted, or grilled). Low-sodium or reduced-sodium tomato and vegetable juice. Low-sodium or reduced-sodium tomato sauce and tomato paste. Low-sodium or reduced-sodium canned vegetables. Fruits All fresh, dried, or frozen fruit. Canned fruit in natural juice (without  added sugar). Meat and other protein foods Skinless chicken or turkey. Ground chicken or turkey. Pork with fat trimmed off. Fish and seafood. Egg whites. Dried beans, peas, or lentils. Unsalted nuts, nut butters, and seeds. Unsalted canned beans. Lean cuts of beef with fat trimmed off. Low-sodium, lean deli meat. Dairy Low-fat (1%) or fat-free (skim) milk. Fat-free, low-fat, or reduced-fat cheeses. Nonfat, low-sodium ricotta or cottage cheese. Low-fat or nonfat yogurt. Low-fat, low-sodium cheese. Fats and oils Soft margarine without trans fats. Vegetable oil. Low-fat, reduced-fat, or light mayonnaise and salad dressings (reduced-sodium). Canola, safflower, olive, soybean, and sunflower oils. Avocado. Seasoning and other foods Herbs. Spices. Seasoning mixes without salt. Unsalted popcorn and pretzels. Fat-free sweets. What foods are not recommended? The items listed may not be a complete list. Talk with your dietitian about what dietary choices are best for you. Grains Baked goods made with fat, such as croissants, muffins, or some breads. Dry pasta or rice meal packs. Vegetables Creamed or fried vegetables. Vegetables in a cheese sauce. Regular canned vegetables (not low-sodium or reduced-sodium). Regular canned tomato sauce and paste (not low-sodium or reduced-sodium). Regular tomato and vegetable juice (not low-sodium or reduced-sodium). Pickles. Olives. Fruits Canned fruit in a light or heavy syrup. Fried fruit. Fruit in cream or butter sauce. Meat and other protein foods Fatty cuts of meat. Ribs. Fried meat. Bacon. Sausage. Bologna and other processed lunch meats. Salami. Fatback. Hotdogs. Bratwurst. Salted nuts and seeds. Canned beans with added salt. Canned or smoked fish. Whole eggs or egg yolks. Chicken or turkey with skin. Dairy Whole or 2% milk, cream, and half-and-half. Whole or full-fat cream cheese. Whole-fat or sweetened yogurt. Full-fat cheese. Nondairy creamers. Whipped toppings.  Processed cheese and cheese spreads. Fats and oils Butter. Stick margarine. Lard. Shortening. Ghee. Bacon fat. Tropical oils, such as coconut, palm kernel, or palm oil. Seasoning and other foods Salted popcorn and pretzels. Onion salt, garlic salt, seasoned salt, table salt, and sea salt. Worcestershire sauce. Tartar sauce. Barbecue sauce. Teriyaki sauce. Soy sauce, including reduced-sodium. Steak sauce. Canned and packaged gravies. Fish sauce. Oyster sauce. Cocktail sauce. Horseradish that you find on the shelf. Ketchup. Mustard. Meat flavorings and tenderizers. Bouillon cubes. Hot sauce and Tabasco sauce. Premade or packaged marinades. Premade or packaged taco seasonings. Relishes. Regular salad dressings. Where to find more information:  National Heart, Lung, and Blood Institute: www.nhlbi.nih.gov  American Heart Association: www.heart.org Summary  The DASH eating plan is a healthy eating plan that has been shown to reduce high blood pressure (hypertension). It may also reduce your risk for type 2 diabetes, heart disease, and stroke.  With the DASH eating plan, you should limit salt (sodium) intake to 2,300 mg a day. If you have hypertension, you may need to reduce your sodium intake to 1,500 mg a day.  When on the DASH eating plan, aim to eat more fresh fruits and vegetables, whole grains, lean proteins, low-fat dairy, and heart-healthy fats.  Work with your health care provider or diet and nutrition specialist (dietitian) to adjust your eating plan to your   individual calorie needs. This information is not intended to replace advice given to you by your health care provider. Make sure you discuss any questions you have with your health care provider. Document Revised: 11/21/2017 Document Reviewed: 12/02/2016 Elsevier Patient Education  2020 Elsevier Inc.  

## 2020-04-17 NOTE — Progress Notes (Signed)
Subjective:    CC: Establish care, hypertension follow-up  HPI: [facility] 69 year old male presenting today to establish care with a new PCP and for hypertension follow-up.  Hypertension-taking amlodipine 10 mg at bedtime.  Checks blood pressures sporadically at home with usual readings in the evenings 120s over 70s but it notes that it is elevated in the morning with systolic blood pressure in the 140s.  Denies chest pain, shortness of breath, palpitations, headache, dizziness, and lower extremity edema.  Allergic rhinitis-using Flonase 2 sprays each nare daily.  Would like to have a refill of this sent for 90-day supply if possible.  Dyslipidemia-taking atorvastatin 40 mg daily.  Depression-taking fluoxetine 20 mg daily.  Feels he is doing well on this dose with adequate symptom management.  Denies SI/HI.  Osteoarthritis-taking naproxen 500 mg twice daily as needed for pain, using sporadically.  I reviewed the past medical history, family history, social history, surgical history, and allergies today and no changes were needed.  Please see the problem list section below in epic for further details.  Past Medical History: Past Medical History:  Diagnosis Date  . Abdominal aortic ectasia ([place]) 03/12/2018  . Allergy   . Cataract   . DDD (degenerative disc disease), lumbar   . Depression   . Hyperlipidemia   . Hypertension   . RBBB   . Renal insufficiency 02/25/2019  . Vasovagal syncope    Past Surgical History: Past Surgical History:  Procedure Laterality Date  . CATARACT EXTRACTION, BILATERAL    . KNEE ARTHROSCOPY W/ MENISCAL REPAIR Left   . ROTATOR CUFF REPAIR Right   . WISDOM TOOTH EXTRACTION     Social History: Social History   Socioeconomic History  . Marital status: Married    Spouse name: Zigmund Daniel  . Number of children: 1  . Years of education: 17  . Highest education level: 12th grade  Occupational History  . Occupation: Dealer    Comment: retired  Tobacco Use   . Smoking status: Never Smoker  . Smokeless tobacco: Never Used  Substance and Sexual Activity  . Alcohol use: Yes    Comment: Occasionally, average 10 drinks/month, usually beer  . Drug use: No  . Sexual activity: Yes  Other Topics Concern  . 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 Difficulty of Paying Living Expenses: Not hard at all  Food Insecurity: No Food Insecurity  . Worried About Charity fundraiser in the Last Year: Never true  . Ran Out of Food in the Last Year: Never true  Transportation Needs: No Transportation Needs  . Lack of Transportation (Medical): No  . Lack of Transportation (Non-Medical): No  Physical Activity: Insufficiently Active  . Days of Exercise per Week: 7 days  . Minutes of Exercise per Session: 20 min  Stress: No Stress Concern Present  . Feeling of Stress : Not at all  Social Connections: Slightly Isolated  . Frequency of Communication with Friends and Family: More than three times a week  . Frequency of Social Gatherings with Friends and Family: Twice a week  . Attends Religious Services: More than 4 times per year  . Active Member of Clubs or Organizations: No  . Attends Archivist Meetings: Never  . Marital Status: Married   Family  History: Family History  Problem Relation Age of Onset  . Heart disease Father   . Lung cancer Father   . Heart attack Brother   . Heart attack Paternal Grandfather    Allergies: Allergies  Allergen Reactions  . Codeine Other (See Comments)    Chest pain  . Paroxetine Other (See Comments)    Makes spacey  . Penicillins Diarrhea   Medications: See med rec.  Review of Systems: No fevers, chills, night sweats, weight loss, chest pain, or shortness of breath.   Objective:    General: Well Developed, well nourished,  and in no acute distress.  Neuro: Alert and oriented x3.  HEENT: Normocephalic, atraumatic.  Skin: Warm and dry. Cardiac: Regular rate and rhythm, no murmurs rubs or gallops, no lower extremity edema.  Respiratory: Clear to auscultation bilaterally. Not using accessory muscles, speaking in full sentences.   Impression and Recommendations:    1. Essential hypertension with goal blood pressure less than 140/90 Blood pressure slightly elevated above goal today.  Continue amlodipine 10 mg at bedtime.  Information provided for DASH diet.  Advised to check blood pressure several times a week and bring log to his next appointment for nurse visit blood pressure check.  Checking CBC, CMP, and lipid panel today. - CBC - COMPLETE METABOLIC PANEL WITH GFR - Lipid Panel w/reflex Direct LDL - amLODipine (NORVASC) 10 MG tablet; Take 1 tablet (10 mg total) by mouth daily.  Dispense: 90 tablet; Refill: 1  2. Dyslipidemia Checking lipid panel today.  Continue atorvastatin 40 mg daily. - Lipid Panel w/reflex Direct LDL  3. Allergic rhinitis, unspecified seasonality, unspecified trigger Continue Flonase 2 sprays each nare daily.  Refill sent with pharmacy note to provide 90-day supply per patient request. - fluticasone (FLONASE) 50 MCG/ACT nasal spray; Place 2 sprays into both nostrils daily. Please give 90 day supply  Dispense: 9.9 mL; Refill: 6  4. Recurrent major depressive disorder, in full remission ([facility]) Continue fluoxetine 20 mg daily.  5. Primary osteoarthritis involving multiple joints Continue naproxen as needed.  Return in about 2 weeks (around 05/01/2020) for nurse visit for BP check . ___________________________________________ Thayer Ohm, DNP, APRN, FNP-BC Primary Care and Sports Medicine [facility] [place]

## 2020-05-01 ENCOUNTER — Ambulatory Visit (INDEPENDENT_AMBULATORY_CARE_PROVIDER_SITE_OTHER): Payer: Medicare Other | Admitting: Medical-Surgical

## 2020-05-01 ENCOUNTER — Other Ambulatory Visit: Payer: Self-pay

## 2020-05-01 VITALS — BP 144/78 | HR 59 | Wt 243.0 lb

## 2020-05-01 DIAGNOSIS — I10 Essential (primary) hypertension: Secondary | ICD-10-CM | POA: Diagnosis not present

## 2020-05-01 NOTE — Progress Notes (Signed)
Established Patient Office Visit  Subjective:  Patient ID: Lance Anderson, male    DOB: [birth year]  Age: 69 y.o. MRN: [id number]  CC:  Chief Complaint  Patient presents with  . Hypertension    HPI Lance Anderson presents for blood pressure check. Home monitor reading in office 159/86. Denies chest pain, shortness of breath or dizziness.   Home blood pressure readings -  142/78 142/79 144/81 123/77 132/72 124/74 135/80  Past Medical History:  Diagnosis Date  . Abdominal aortic ectasia (HCC) 03/12/2018  . Allergy   . Cataract   . DDD (degenerative disc disease), lumbar   . Depression   . Hyperlipidemia   . Hypertension   . RBBB   . Renal insufficiency 02/25/2019  . Vasovagal syncope     Past Surgical History:  Procedure Laterality Date  . CATARACT EXTRACTION, BILATERAL    . KNEE ARTHROSCOPY W/ MENISCAL REPAIR Left   . ROTATOR CUFF REPAIR Right   . WISDOM TOOTH EXTRACTION      Family History  Problem Relation Age of Onset  . Heart disease Father   . Lung cancer Father   . Heart attack Brother   . Heart attack Paternal Grandfather     Social History   Socioeconomic History  . Marital status: Married    Spouse name: Joyce Gross  . Number of children: 1  . Years of education: 41  . Highest education level: 12th grade  Occupational History  . Occupation: Curator    Comment: retired  Tobacco Use  . Smoking status: Never Smoker  . Smokeless tobacco: Never Used  Substance and Sexual Activity  . Alcohol use: Yes    Comment: Occasionally, average 10 drinks/month, usually beer  . Drug use: No  . Sexual activity: Yes  Other Topics Concern  . 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 Difficulty of Paying Living Expenses: Not hard at all  Food Insecurity: No Food Insecurity  . Worried About  Programme researcher, broadcasting/film/video in the Last Year: Never true  . Ran Out of Food in the Last Year: Never true  Transportation Needs: No Transportation Needs  . Lack of Transportation (Medical): No  . Lack of Transportation (Non-Medical): No  Physical Activity: Insufficiently Active  . Days of Exercise per Week: 7 days  . Minutes of Exercise per Session: 20 min  Stress: No Stress Concern Present  . Feeling of Stress : Not at all  Social Connections: Slightly Isolated  . Frequency of Communication with Friends and Family: More than three times a week  . Frequency of Social Gatherings with Friends and Family: Twice a week  . Attends Religious Services: More than 4 times per year  . Active Member of Clubs or Organizations: No  . Attends Banker Meetings: Never  . Marital Status: Married  Catering manager Violence: Not At Risk  . Fear of Current or Ex-Partner: No  . Emotionally Abused: No  . Physically Abused: No  . Sexually Abused: No    Outpatient Medications Prior to Visit  Medication Sig Dispense Refill  . amLODipine (NORVASC) 10 MG tablet Take 1 tablet (10 mg total) by mouth daily. 90 tablet 1  . aspirin 81 MG EC tablet Take 1 tablet (81 mg total) by mouth  daily. 30 tablet 11  . atorvastatin (LIPITOR) 40 MG tablet Take 1 tablet (40 mg total) by mouth at bedtime. 90 tablet 1  . FLUoxetine (PROZAC) 20 MG capsule Take 1 capsule by mouth once daily 90 capsule 1  . fluticasone (FLONASE) 50 MCG/ACT nasal spray Place 2 sprays into both nostrils daily. Please give 90 day supply 9.9 mL 6  . naproxen (NAPROSYN) 500 MG tablet Take 1 tablet (500 mg total) by mouth 2 (two) times daily as needed for moderate pain. 60 tablet 1   No facility-administered medications prior to visit.    Allergies  Allergen Reactions  . Codeine Other (See Comments)    Chest pain  . Paroxetine Other (See Comments)    Makes spacey  . Penicillins Diarrhea    ROS Review of Systems    Objective:     Physical Exam  BP (!) 144/78   Pulse (!) 59   Wt 243 lb (110.2 kg)   SpO2 98%   BMI 33.89 kg/m  Wt Readings from Last 3 Encounters:  05/01/20 243 lb (110.2 kg)  04/17/20 241 lb 3.2 oz (109.4 kg)  01/23/20 235 lb (106.6 kg)     There are no preventive care reminders to display for this patient.  There are no preventive care reminders to display for this patient.  No results found for: TSH Lab Results  Component Value Date   WBC 6.5 04/17/2020   HGB 14.7 04/17/2020   HCT 43.1 04/17/2020   MCV 93.3 04/17/2020   PLT 254 04/17/2020   Lab Results  Component Value Date   NA 140 04/17/2020   K 4.5 04/17/2020   CO2 26 04/17/2020   GLUCOSE 97 04/17/2020   BUN 30 (H) 04/17/2020   CREATININE 1.01 04/17/2020   BILITOT 0.4 04/17/2020   ALKPHOS 68 08/04/2017   AST 18 04/17/2020   ALT 15 04/17/2020   PROT 6.9 04/17/2020   ALBUMIN 3.8 08/04/2017   CALCIUM 8.9 04/17/2020   Lab Results  Component Value Date   CHOL 169 04/17/2020   Lab Results  Component Value Date   HDL 51 04/17/2020   Lab Results  Component Value Date   LDLCALC 104 (H) 04/17/2020   Lab Results  Component Value Date   TRIG 47 04/17/2020   Lab Results  Component Value Date   CHOLHDL 3.3 04/17/2020   No results found for: HGBA1C    Assessment & Plan:  HTN - Per PCP, Patient advised to continue current medications as directed. Follow up in 3 months with PCP. Purchase another home blood pressure monitor. Monitor blood pressure a few days a week. Call if blood pressure numbers are elevated.    Problem List Items Addressed This Visit    None    Visit Diagnoses    Elevated blood pressure reading in office with diagnosis of hypertension    -  Primary      No orders of the defined types were placed in this encounter.   Follow-up: Return in about 3 months (around 08/01/2020) for HTN with PCP.Marland Kitchen    Lavell Luster, [place]

## 2020-05-12 ENCOUNTER — Other Ambulatory Visit: Payer: Self-pay | Admitting: Physician Assistant

## 2020-05-12 DIAGNOSIS — E785 Hyperlipidemia, unspecified: Secondary | ICD-10-CM

## 2020-05-12 DIAGNOSIS — F3342 Major depressive disorder, recurrent, in full remission: Secondary | ICD-10-CM

## 2020-06-26 ENCOUNTER — Other Ambulatory Visit: Payer: Self-pay | Admitting: Physician Assistant

## 2020-06-26 DIAGNOSIS — M5137 Other intervertebral disc degeneration, lumbosacral region: Secondary | ICD-10-CM

## 2020-08-02 ENCOUNTER — Ambulatory Visit: Payer: Medicare Other | Admitting: Medical-Surgical

## 2020-08-07 ENCOUNTER — Ambulatory Visit: Payer: Medicare Other | Admitting: Medical-Surgical

## 2020-08-14 ENCOUNTER — Ambulatory Visit (INDEPENDENT_AMBULATORY_CARE_PROVIDER_SITE_OTHER): Payer: Medicare Other | Admitting: Medical-Surgical

## 2020-08-14 ENCOUNTER — Encounter: Payer: Self-pay | Admitting: Medical-Surgical

## 2020-08-14 VITALS — BP 136/87 | HR 66 | Temp 98.1°F | Ht 71.0 in | Wt 241.3 lb

## 2020-08-14 DIAGNOSIS — Z7189 Other specified counseling: Secondary | ICD-10-CM

## 2020-08-14 DIAGNOSIS — F3342 Major depressive disorder, recurrent, in full remission: Secondary | ICD-10-CM

## 2020-08-14 DIAGNOSIS — I10 Essential (primary) hypertension: Secondary | ICD-10-CM | POA: Diagnosis not present

## 2020-08-14 DIAGNOSIS — M2550 Pain in unspecified joint: Secondary | ICD-10-CM | POA: Diagnosis not present

## 2020-08-14 MED ORDER — FLUOXETINE HCL 20 MG PO CAPS: 20.0000 mg | ORAL_CAPSULE | Freq: Every day | ORAL | 0 refills | Status: DC

## 2020-08-14 NOTE — Progress Notes (Signed)
Subjective:    CC: HTN follow up  HPI: Pleasant 69 year old male presenting for HTN follow up. Taking Amlodipine 10mg  daily, tolerating well. Was checking his BP at home but his machine broke and he has not gotten a replacement. Readings were always 130s/80s or less. Staying active and playing with his 2 grandsons regularly. Some lower extremity edema late in the afternoons that resolves by morning. Denies chest pain, shortness of breath, palpitations, dizziness, headaches, and vision changes.   Has had some joint aches/pain with increased activity recently. Asking about herbal/supplements to help with joint pains. Takes Naproxen as needed which helps but doesn't like to take it often due to possible side effects.  Taking Prozac 20mg  daily for depression. Tolerating well without side effects. Feels this works well for him. Due for refills. Denies SI/HI.  Has questions about the COVID vaccine. Had COVID in December and is wondering if he should get the vaccine.   I reviewed the past medical history, family history, social history, surgical history, and allergies today and no changes were needed.  Please see the problem list section below in epic for further details.  Past Medical History: Past Medical History:  Diagnosis Date  . Abdominal aortic ectasia (HCC) 03/12/2018  . Allergy   . Cataract   . DDD (degenerative disc disease), lumbar   . Depression   . Hyperlipidemia   . Hypertension   . RBBB   . Renal insufficiency 02/25/2019  . Vasovagal syncope    Past Surgical History: Past Surgical History:  Procedure Laterality Date  . CATARACT EXTRACTION, BILATERAL    . KNEE ARTHROSCOPY W/ MENISCAL REPAIR Left   . ROTATOR CUFF REPAIR Right   . WISDOM TOOTH EXTRACTION     Social History: Social History   Socioeconomic History  . Marital status: Married    Spouse name: 03/14/2018  . Number of children: 1  . Years of education: 29  . Highest education level: 12th grade  Occupational  History  . Occupation: [employer]    Comment: retired  Tobacco Use  . Smoking status: Never Smoker  . Smokeless tobacco: Never Used  Vaping Use  . Vaping Use: Never used  Substance and Sexual Activity  . Alcohol use: Yes    Comment: Occasionally, average 10 drinks/month, usually beer  . Drug use: No  . Sexual activity: Yes  Other Topics Concern  . 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 Difficulty of Paying Living Expenses: Not hard at all  Food Insecurity: No Food Insecurity  . Worried About [employer] in the Last Year: Never true  . Ran Out of Food in the Last Year: Never true  Transportation Needs: No Transportation Needs  . Lack of Transportation (Medical): No  . Lack of Transportation (Non-Medical): No  Physical Activity: Insufficiently Active  . Days of Exercise per Week: 7 days  . Minutes of Exercise per Session: 20 min  Stress: No Stress Concern Present  . Feeling of Stress : Not at all  Social Connections: Moderately Integrated  . Frequency of Communication with Friends and Family: More than three times a week  . Frequency of Social Gatherings with Friends and Family: Twice a week  . Attends Religious Services: More than 4 times per year  .  Active Member of Clubs or Organizations: No  . Attends Banker Meetings: Never  . Marital Status: Married   Family History: Family History  Problem Relation Age of Onset  . Heart disease Father   . Lung cancer Father   . Heart attack Brother   . Heart attack Paternal Grandfather    Allergies: Allergies  Allergen Reactions  . Codeine Other (See Comments)    Chest pain  . Paroxetine Other (See Comments)    Makes spacey  . Penicillins Diarrhea   Medications: See med rec.  Review of Systems: See HPI for pertinent positives  and negatives.   Objective:    General: Well Developed, well nourished, and in no acute distress.  Neuro: Alert and oriented x3.  HEENT: Normocephalic, atraumatic.  Skin: Warm and dry. Cardiac: Regular rate and rhythm, no murmurs rubs or gallops, no lower extremity edema.  Respiratory: Clear to auscultation bilaterally. Not using accessory muscles, speaking in full sentences.   Impression and Recommendations:    1. Essential hypertension with goal blood pressure less than 140/90 Continue Amlodipine 10mg  daily. DASH diet information provided. Discussed lower extremity edema, not bothering him and is mild so he is ok with continuing the medication.   2. Recurrent major depressive disorder, in full remission (HCC) Continue Fluoxetine 20mg  daily. Refill sent to pharmacy. - FLUoxetine (PROZAC) 20 MG capsule; Take 1 capsule (20 mg total) by mouth daily.  Dispense: 90 capsule; Refill: 0  3. Advice given about COVID-19 virus infection Discussed risks and benefits of vaccination as well as the different vaccines that are currently available. Recommend vaccination. Patient verbalized understanding and will consider his options.   4. Arthralgia, unspecified joint Ok to use Naproxen but if he does choose to use an herbal remedy, advised to evaluate for medication interactions with his current prescribed medications before starting it. Recommend regular exercise to help prevent inflammation that accompanies burst of activity after periods of being sedentary.  Return in about 3 months (around 11/14/2020) for HTN follow up. ___________________________________________ , DNP, APRN, FNP-BC Primary Care and Sports Medicine [facility] [place]

## 2020-08-14 NOTE — Patient Instructions (Signed)
DASH Eating Plan DASH stands for "Dietary Approaches to Stop Hypertension." The DASH eating plan is a healthy eating plan that has been shown to reduce high blood pressure (hypertension). It may also reduce your risk for type 2 diabetes, heart disease, and stroke. The DASH eating plan may also help with weight loss. What are tips for following this plan?  General guidelines  Avoid eating more than 2,300 mg (milligrams) of salt (sodium) a day. If you have hypertension, you may need to reduce your sodium intake to 1,500 mg a day.  Limit alcohol intake to no more than 1 drink a day for nonpregnant women and 2 drinks a day for men. One drink equals 12 oz of beer, 5 oz of wine, or 1 oz of hard liquor.  Work with your health care provider to maintain a healthy body weight or to lose weight. Ask what an ideal weight is for you.  Get at least 30 minutes of exercise that causes your heart to beat faster (aerobic exercise) most days of the week. Activities may include walking, swimming, or biking.  Work with your health care provider or diet and nutrition specialist (dietitian) to adjust your eating plan to your individual calorie needs. Reading food labels   Check food labels for the amount of sodium per serving. Choose foods with less than 5 percent of the Daily Value of sodium. Generally, foods with less than 300 mg of sodium per serving fit into this eating plan.  To find whole grains, look for the word "whole" as the first word in the ingredient list. Shopping  Buy products labeled as "low-sodium" or "no salt added."  Buy fresh foods. Avoid canned foods and premade or frozen meals. Cooking  Avoid adding salt when cooking. Use salt-free seasonings or herbs instead of table salt or sea salt. Check with your health care provider or pharmacist before using salt substitutes.  Do not fry foods. Cook foods using healthy methods such as baking, boiling, grilling, and broiling instead.  Cook with  heart-healthy oils, such as olive, canola, soybean, or sunflower oil. Meal planning  Eat a balanced diet that includes: ? 5 or more servings of fruits and vegetables each day. At each meal, try to fill half of your plate with fruits and vegetables. ? Up to 6-8 servings of whole grains each day. ? Less than 6 oz of lean meat, poultry, or fish each day. A 3-oz serving of meat is about the same size as a deck of cards. One egg equals 1 oz. ? 2 servings of low-fat dairy each day. ? A serving of nuts, seeds, or beans 5 times each week. ? Heart-healthy fats. Healthy fats called Omega-3 fatty acids are found in foods such as flaxseeds and coldwater fish, like sardines, salmon, and mackerel.  Limit how much you eat of the following: ? Canned or prepackaged foods. ? Food that is high in trans fat, such as fried foods. ? Food that is high in saturated fat, such as fatty meat. ? Sweets, desserts, sugary drinks, and other foods with added sugar. ? Full-fat dairy products.  Do not salt foods before eating.  Try to eat at least 2 vegetarian meals each week.  Eat more home-cooked food and less restaurant, buffet, and fast food.  When eating at a restaurant, ask that your food be prepared with less salt or no salt, if possible. What foods are recommended? The items listed may not be a complete list. Talk with your dietitian about   what dietary choices are best for you. Grains Whole-grain or whole-wheat bread. Whole-grain or whole-wheat pasta. Brown rice. Oatmeal. Quinoa. Bulgur. Whole-grain and low-sodium cereals. Pita bread. Low-fat, low-sodium crackers. Whole-wheat flour tortillas. Vegetables Fresh or frozen vegetables (raw, steamed, roasted, or grilled). Low-sodium or reduced-sodium tomato and vegetable juice. Low-sodium or reduced-sodium tomato sauce and tomato paste. Low-sodium or reduced-sodium canned vegetables. Fruits All fresh, dried, or frozen fruit. Canned fruit in natural juice (without  added sugar). Meat and other protein foods Skinless chicken or turkey. Ground chicken or turkey. Pork with fat trimmed off. Fish and seafood. Egg whites. Dried beans, peas, or lentils. Unsalted nuts, nut butters, and seeds. Unsalted canned beans. Lean cuts of beef with fat trimmed off. Low-sodium, lean deli meat. Dairy Low-fat (1%) or fat-free (skim) milk. Fat-free, low-fat, or reduced-fat cheeses. Nonfat, low-sodium ricotta or cottage cheese. Low-fat or nonfat yogurt. Low-fat, low-sodium cheese. Fats and oils Soft margarine without trans fats. Vegetable oil. Low-fat, reduced-fat, or light mayonnaise and salad dressings (reduced-sodium). Canola, safflower, olive, soybean, and sunflower oils. Avocado. Seasoning and other foods Herbs. Spices. Seasoning mixes without salt. Unsalted popcorn and pretzels. Fat-free sweets. What foods are not recommended? The items listed may not be a complete list. Talk with your dietitian about what dietary choices are best for you. Grains Baked goods made with fat, such as croissants, muffins, or some breads. Dry pasta or rice meal packs. Vegetables Creamed or fried vegetables. Vegetables in a cheese sauce. Regular canned vegetables (not low-sodium or reduced-sodium). Regular canned tomato sauce and paste (not low-sodium or reduced-sodium). Regular tomato and vegetable juice (not low-sodium or reduced-sodium). Pickles. Olives. Fruits Canned fruit in a light or heavy syrup. Fried fruit. Fruit in cream or butter sauce. Meat and other protein foods Fatty cuts of meat. Ribs. Fried meat. Bacon. Sausage. Bologna and other processed lunch meats. Salami. Fatback. Hotdogs. Bratwurst. Salted nuts and seeds. Canned beans with added salt. Canned or smoked fish. Whole eggs or egg yolks. Chicken or turkey with skin. Dairy Whole or 2% milk, cream, and half-and-half. Whole or full-fat cream cheese. Whole-fat or sweetened yogurt. Full-fat cheese. Nondairy creamers. Whipped toppings.  Processed cheese and cheese spreads. Fats and oils Butter. Stick margarine. Lard. Shortening. Ghee. Bacon fat. Tropical oils, such as coconut, palm kernel, or palm oil. Seasoning and other foods Salted popcorn and pretzels. Onion salt, garlic salt, seasoned salt, table salt, and sea salt. Worcestershire sauce. Tartar sauce. Barbecue sauce. Teriyaki sauce. Soy sauce, including reduced-sodium. Steak sauce. Canned and packaged gravies. Fish sauce. Oyster sauce. Cocktail sauce. Horseradish that you find on the shelf. Ketchup. Mustard. Meat flavorings and tenderizers. Bouillon cubes. Hot sauce and Tabasco sauce. Premade or packaged marinades. Premade or packaged taco seasonings. Relishes. Regular salad dressings. Where to find more information:  National Heart, Lung, and Blood Institute: www.nhlbi.nih.gov  American Heart Association: www.heart.org Summary  The DASH eating plan is a healthy eating plan that has been shown to reduce high blood pressure (hypertension). It may also reduce your risk for type 2 diabetes, heart disease, and stroke.  With the DASH eating plan, you should limit salt (sodium) intake to 2,300 mg a day. If you have hypertension, you may need to reduce your sodium intake to 1,500 mg a day.  When on the DASH eating plan, aim to eat more fresh fruits and vegetables, whole grains, lean proteins, low-fat dairy, and heart-healthy fats.  Work with your health care provider or diet and nutrition specialist (dietitian) to adjust your eating plan to your   individual calorie needs. This information is not intended to replace advice given to you by your health care provider. Make sure you discuss any questions you have with your health care provider. Document Revised: 11/21/2017 Document Reviewed: 12/02/2016 Elsevier Patient Education  2020 Elsevier Inc.  

## 2020-08-24 ENCOUNTER — Other Ambulatory Visit: Payer: Self-pay | Admitting: Medical-Surgical

## 2020-08-24 DIAGNOSIS — E785 Hyperlipidemia, unspecified: Secondary | ICD-10-CM

## 2020-09-06 ENCOUNTER — Other Ambulatory Visit: Payer: Self-pay

## 2020-09-06 ENCOUNTER — Ambulatory Visit (INDEPENDENT_AMBULATORY_CARE_PROVIDER_SITE_OTHER): Payer: Medicare Other

## 2020-09-06 ENCOUNTER — Encounter: Payer: Self-pay | Admitting: Medical-Surgical

## 2020-09-06 ENCOUNTER — Ambulatory Visit (INDEPENDENT_AMBULATORY_CARE_PROVIDER_SITE_OTHER): Payer: Medicare Other | Admitting: Medical-Surgical

## 2020-09-06 VITALS — BP 126/72 | HR 86 | Temp 98.2°F | Ht 71.0 in | Wt 235.0 lb

## 2020-09-06 DIAGNOSIS — J019 Acute sinusitis, unspecified: Secondary | ICD-10-CM | POA: Diagnosis not present

## 2020-09-06 DIAGNOSIS — R05 Cough: Secondary | ICD-10-CM

## 2020-09-06 DIAGNOSIS — R062 Wheezing: Secondary | ICD-10-CM

## 2020-09-06 DIAGNOSIS — R059 Cough, unspecified: Secondary | ICD-10-CM

## 2020-09-06 MED ORDER — HYDROCODONE-HOMATROPINE 5-1.5 MG/5ML PO SYRP: 5.0000 mL | ORAL_SOLUTION | Freq: Three times a day (TID) | ORAL | 0 refills | Status: DC | PRN

## 2020-09-06 MED ORDER — PREDNISONE 50 MG PO TABS: 50.0000 mg | ORAL_TABLET | Freq: Every day | ORAL | 0 refills | Status: DC

## 2020-09-06 MED ORDER — AZITHROMYCIN 250 MG PO TABS: ORAL_TABLET | ORAL | 0 refills | Status: DC

## 2020-09-06 MED ORDER — ALBUTEROL SULFATE HFA 108 (90 BASE) MCG/ACT IN AERS: 2.0000 | INHALATION_SPRAY | Freq: Four times a day (QID) | RESPIRATORY_TRACT | 0 refills | Status: DC | PRN

## 2020-09-06 NOTE — Progress Notes (Signed)
Subjective:    CC: cough/wheezing x 1 month  HPI: Pleasant 69 year old male presenting for evaluation of coughing and wheezing for approximately 1 month.  At his last appointment for hypertension follow-up, he was noted to have mild scattered wheezing but was otherwise asymptomatic.  Today he reports his symptoms did not improve after that visit and have continued to worsen.  He notes he is experiencing sinus pressure, postnasal drip, bad taste in his mouth, and nonproductive cough that is worse at night.  Also reports some dizziness when he has severe coughing spells.  He is not sleeping well due to his cough.  Denies fever, chills, chest pain, shortness of breath, sore throat, and GI symptoms.  Is a non-smoker and denies any previous lung issues.  No inhalers at home.  Has not tried over-the-counter medications to treat his symptoms  I reviewed the past medical history, family history, social history, surgical history, and allergies today and no changes were needed.  Please see the problem list section below in epic for further details.  Past Medical History: Past Medical History:  Diagnosis Date  . Abdominal aortic ectasia (HCC) 03/12/2018  . Allergy   . Cataract   . DDD (degenerative disc disease), lumbar   . Depression   . Hyperlipidemia   . Hypertension   . RBBB   . Renal insufficiency 02/25/2019  . Vasovagal syncope    Past Surgical History: Past Surgical History:  Procedure Laterality Date  . CATARACT EXTRACTION, BILATERAL    . KNEE ARTHROSCOPY W/ MENISCAL REPAIR Left   . ROTATOR CUFF REPAIR Right   . WISDOM TOOTH EXTRACTION     Social History: Social History   Socioeconomic History  . Marital status: Married    Spouse name: Joyce Gross  . Number of children: 1  . Years of education: 8  . Highest education level: 12th grade  Occupational History  . Occupation: Curator    Comment: retired  Tobacco Use  . Smoking status: Never Smoker  . Smokeless tobacco: Never Used   Vaping Use  . Vaping Use: Never used  Substance and Sexual Activity  . Alcohol use: Yes    Comment: Occasionally, average 10 drinks/month, usually beer  . Drug use: No  . Sexual activity: Yes  Other Topics Concern  . 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 Difficulty of Paying Living Expenses: Not hard at all  Food Insecurity: No Food Insecurity  . Worried About Programme researcher, broadcasting/film/video in the Last Year: Never true  . Ran Out of Food in the Last Year: Never true  Transportation Needs: No Transportation Needs  . Lack of Transportation (Medical): No  . Lack of Transportation (Non-Medical): No  Physical Activity: Insufficiently Active  . Days of Exercise per Week: 7 days  . Minutes of Exercise per Session: 20 min  Stress: No Stress Concern Present  . Feeling of Stress : Not at all  Social Connections: Moderately Integrated  . Frequency of Communication with Friends and Family: More than three times a week  . Frequency of Social Gatherings with Friends and Family: Twice a week  . Attends Religious Services: More than 4 times per year  . Active Member of Clubs or Organizations: No  . Attends Banker Meetings: Never  .  Marital Status: Married   Family History: Family History  Problem Relation Age of Onset  . Heart disease Father   . Lung cancer Father   . Heart attack Brother   . Heart attack Paternal Grandfather    Allergies: Allergies  Allergen Reactions  . Codeine Other (See Comments)    Chest pain  . Paroxetine Other (See Comments)    Makes spacey  . Penicillins Diarrhea   Medications: See med rec.  Review of Systems: See HPI for pertinent positives and negatives.   Objective:    General: Well Developed, well nourished, and in no acute distress.  Neuro: Alert and oriented x3.   HEENT: Normocephalic, atraumatic, pupils equal round reactive to light, neck supple, no masses, no lymphadenopathy, thyroid nonpalpable.  No sinus tenderness.  Posterior oropharynx pink without exudate or cobblestoning.  Bilateral TMs normal. Skin: Warm and dry. Cardiac: Regular rate and rhythm, no murmurs rubs or gallops, no lower extremity edema.  Respiratory: Diffuse inspiratory and expiratory wheezes in all lung fields with rhonchi noted in the left upper lobe. Not using accessory muscles, speaking in full sentences.  Impression and Recommendations:    1. Wheezing/cough STAT CXR negative for pneumonia albuterol inhaler 2 puffs every 6 hours as needed for wheezing.  Also sending in Hycodan cough syrup 3 times daily as needed to help with sleep.  Advised patient this cough syrup will make him drowsy so avoid taking it during the day or when having to do activities.  Recommend increasing fluids. - DG Chest 2 View; Future  2. Acute non-recurrent sinusitis, unspecified location With the sinus pressure and history of having sinusitis with out sinus tenderness or pain, will go ahead and treat with azithromycin.  Allergy to penicillin.  Increase fluids and rest.  Return if symptoms worsen or fail to improve. ___________________________________________ Thayer Ohm, DNP, APRN, FNP-BC Primary Care and Sports Medicine [facility] [place]

## 2020-09-24 ENCOUNTER — Telehealth: Payer: Medicare Other | Admitting: Orthopedic Surgery

## 2020-09-24 DIAGNOSIS — R0989 Other specified symptoms and signs involving the circulatory and respiratory systems: Secondary | ICD-10-CM | POA: Diagnosis not present

## 2020-09-24 MED ORDER — DOXYCYCLINE HYCLATE 100 MG PO TABS: 100.0000 mg | ORAL_TABLET | Freq: Two times a day (BID) | ORAL | 0 refills | Status: DC

## 2020-09-24 NOTE — Progress Notes (Signed)
We are sorry that you are not feeling well.  Here is how we plan to help!  Based on what you have shared with me it looks like you have sinusitis.  Sinusitis is inflammation and infection in the sinus cavities of the head.  Based on your presentation I believe you most likely have Acute Bacterial Sinusitis.  This is an infection caused by bacteria and is treated with antibiotics. I have prescribed Doxycycline 100mg by mouth twice a day for 10 days. You may use an oral decongestant such as Mucinex D or if you have glaucoma or high blood pressure use plain Mucinex. Saline nasal spray help and can safely be used as often as needed for congestion.  If you develop worsening sinus pain, fever or notice severe headache and vision changes, or if symptoms are not better after completion of antibiotic, please schedule an appointment with a health care provider.    Sinus infections are not as easily transmitted as other respiratory infection, however we still recommend that you avoid close contact with loved ones, especially the very young and elderly.  Remember to wash your hands thoroughly throughout the day as this is the number one way to prevent the spread of infection!  Home Care:  Only take medications as instructed by your medical team.  Complete the entire course of an antibiotic.  Do not take these medications with alcohol.  A steam or ultrasonic humidifier can help congestion.  You can place a towel over your head and breathe in the steam from hot water coming from a faucet.  Avoid close contacts especially the very young and the elderly.  Cover your mouth when you cough or sneeze.  Always remember to wash your hands.  Get Help Right Away If:  You develop worsening fever or sinus pain.  You develop a severe head ache or visual changes.  Your symptoms persist after you have completed your treatment plan.  Make sure you  Understand these instructions.  Will watch your  condition.  Will get help right away if you are not doing well or get worse.  Your e-visit answers were reviewed by a board certified advanced clinical practitioner to complete your personal care plan.  Depending on the condition, your plan could have included both over the counter or prescription medications.  If there is a problem please reply  once you have received a response from your provider.  Your safety is important to us.  If you have drug allergies check your prescription carefully.    You can use MyChart to ask questions about today's visit, request a non-urgent call back, or ask for a work or school excuse for 24 hours related to this e-Visit. If it has been greater than 24 hours you will need to follow up with your provider, or enter a new e-Visit to address those concerns.  You will get an e-mail in the next two days asking about your experience.  I hope that your e-visit has been valuable and will speed your recovery. Thank you for using e-visits.     Greater than 5 minutes, yet less than 10 minutes of time have been spent researching, coordinating and implementing care for this patient today.    

## 2020-10-08 ENCOUNTER — Emergency Department (INDEPENDENT_AMBULATORY_CARE_PROVIDER_SITE_OTHER)
Admission: RE | Admit: 2020-10-08 | Discharge: 2020-10-08 | Disposition: A | Discharge status: Home / Self Care | Payer: Medicare Other | Source: Ambulatory Visit

## 2020-10-08 ENCOUNTER — Other Ambulatory Visit: Payer: Self-pay

## 2020-10-08 VITALS — BP 148/83 | HR 65 | Temp 97.9°F

## 2020-10-08 DIAGNOSIS — R0981 Nasal congestion: Secondary | ICD-10-CM

## 2020-10-08 DIAGNOSIS — J329 Chronic sinusitis, unspecified: Secondary | ICD-10-CM

## 2020-10-08 DIAGNOSIS — R519 Headache, unspecified: Secondary | ICD-10-CM

## 2020-10-08 MED ORDER — CEFDINIR 300 MG PO CAPS: 300.0000 mg | ORAL_CAPSULE | Freq: Two times a day (BID) | ORAL | 0 refills | Status: AC

## 2020-10-08 NOTE — ED Provider Notes (Signed)
[facility]    CSN: [id number] Arrival date & time: 10/08/20  0848      History   Chief Complaint Chief Complaint  Patient presents with  . Appointment  . Facial Pain    HPI Lance Anderson is a 69 y.o. male.   HPI Lance Anderson is a 69 y.o. male presenting to UC with c/o intermittent sinus headache and congestion that started about 2 months ago with URI symptoms of cough and wheeze. He had a CXR on 09/06/20, which was normal but was prescribed azithromycin and prednisone to help with wheeze. The cough and wheeze have resolved but he continues to have sinus pressure and congestion.  He completed a course of doxycycline for suspected sinus infection 3 days ago.  Headache was improving but has returned since finishing the doxycyline. Pain is aching, 5/10 at worst.  Denies fever, chills, n/v/d. Denies vision change or dizziness.  No chest pain or SOB.  Pt had COVID last Christmas. He has not received the COVID vaccine. No known sick contacts. Hx of sinus infections found on CT in the past.    Past Medical History:  Diagnosis Date  . Abdominal aortic ectasia (HCC) 03/12/2018  . Allergy   . Cataract   . DDD (degenerative disc disease), lumbar   . Depression   . Hyperlipidemia   . Hypertension   . RBBB   . Renal insufficiency 02/25/2019  . Vasovagal syncope     Patient Active Problem List   Diagnosis Date Noted  . COVID-19 virus infection 12/20/2019  . Renal insufficiency 02/25/2019  . 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 Cervical radiculopathy 10/30/2018  . Right bundle branch block (RBBB) 05/29/2018  . Sinus bradycardia 05/29/2018  . Peripheral edema 04/27/2018  . Venous stasis dermatitis of right lower extremity 04/27/2018  . Strain of calf muscle 04/27/2018  . Abdominal aortic ectasia (HCC) 03/12/2018  . 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 Recurrent major depressive disorder, in full remission (HCC) 02/25/2018  . Encounter  for long-term (current) use of medications 02/25/2018  . Encounter for abdominal aortic aneurysm (AAA) screening 02/25/2018  . Dyslipidemia, goal LDL below 100 02/25/2018  . Refused influenza vaccine 02/25/2018  . Refused pneumococcal vaccination 02/25/2018  . Pruritus 08/04/2017  . Encounter for monitoring statin therapy 08/04/2017  . Depression 05/21/2017  . Family history of heart disease in male family member before age 77 05/21/2017  . Tear of meniscus of knee 05/21/2017  . Class 1 obesity due to excess calories with serious comorbidity in adult 05/21/2017  . Allergic rhinitis 05/21/2017  . DDD (degenerative disc disease), lumbosacral 05/21/2017  . Vasovagal episode 05/21/2017  . Essential hypertension with goal blood pressure less than 140/90 08/17/2015  . Pseudophakia of both eyes 10/20/2013  . Dyslipidemia 08/10/2013    Past Surgical History:  Procedure Laterality Date  . CATARACT EXTRACTION, BILATERAL    . KNEE ARTHROSCOPY W/ MENISCAL REPAIR Left   . ROTATOR CUFF REPAIR Right   . WISDOM TOOTH EXTRACTION         Home Medications    Prior to Admission medications   Medication Sig Start Date End Date Taking? Authorizing Provider  albuterol (VENTOLIN HFA) 108 (90 Base) MCG/ACT inhaler Inhale 2 puffs into the lungs every 6 (six) hours as needed for wheezing. 09/06/20  Yes Christen Butter, NP  amLODipine (NORVASC) 10 MG tablet Take 1 tablet (10 mg total) by mouth daily. 04/17/20  Yes Christen Butter, NP  aspirin 81 MG EC  tablet Take 1 tablet (81 mg total) by mouth daily. 02/25/18  Yes Carlis Stableummings, Charley Elizabeth, PA-C  atorvastatin (LIPITOR) 40 MG tablet TAKE 1 TABLET BY MOUTH AT BEDTIME 08/24/20  Yes Christen ButterJessup, Joy, NP  FLUoxetine (PROZAC) 20 MG capsule Take 1 capsule (20 mg total) by mouth daily. 08/14/20  Yes Christen ButterJessup, Joy, NP  fluticasone (FLONASE) 50 MCG/ACT nasal spray Place 2 sprays into both nostrils daily. Please give 90 day supply 04/17/20  Yes Christen ButterJessup, Joy, NP  naproxen (NAPROSYN) 500  MG tablet Take 1 tablet by mouth twice daily as needed for pain 06/27/20  Yes Christen ButterJessup, Joy, NP  cefdinir (OMNICEF) 300 MG capsule Take 1 capsule (300 mg total) by mouth 2 (two) times daily for 10 days. 10/08/20 10/18/20  Lurene ShadowPhelps, Epic Tribbett O, PA-C  HYDROcodone-homatropine (HYCODAN) 5-1.5 MG/5ML syrup Take 5 mLs by mouth every 8 (eight) hours as needed for cough. 09/06/20   Christen ButterJessup, Joy, NP  predniSONE (DELTASONE) 50 MG tablet Take 1 tablet (50 mg total) by mouth daily. 09/06/20   Christen ButterJessup, Joy, NP    Family History Family History  Problem Relation Age of Onset  . Heart disease Father   . Lung cancer Father   . Heart attack Brother   . Heart attack Paternal Grandfather     Social History Social History   Tobacco Use  . Smoking status: Never Smoker  . Smokeless tobacco: Never Used  Vaping Use  . Vaping Use: Never used  Substance Use Topics  . Alcohol use: Yes    Comment: Occasionally, average 10 drinks/month, usually beer  . Drug use: No     Allergies   Codeine, Paroxetine, and Penicillins   Review of Systems Review of Systems  Constitutional: Positive for fatigue. Negative for chills and fever.  HENT: Positive for congestion, postnasal drip, sinus pressure and sinus pain. Negative for ear pain, sore throat, trouble swallowing and voice change.   Eyes: Negative for photophobia and visual disturbance.  Respiratory: Negative for cough and shortness of breath.   Cardiovascular: Negative for chest pain and palpitations.  Gastrointestinal: Negative for abdominal pain, diarrhea, nausea and vomiting.  Musculoskeletal: Negative for arthralgias, back pain and myalgias.  Skin: Negative for rash.  Neurological: Positive for headaches. Negative for dizziness and light-headedness.  All other systems reviewed and are negative.    Physical Exam Triage Vital Signs ED Triage Vitals [10/08/20 0906]  Enc Vitals Group     BP (!) 148/83     Pulse Rate 65     Resp      Temp 97.9 F (36.6 C)      Temp Source Oral     SpO2 94 %     Weight      Height      Head Circumference      Peak Flow      Pain Score 5     Pain Loc      Pain Edu?      Excl. in GC?    No data found.  Updated Vital Signs BP (!) 148/83 (BP Location: Right Arm)   Pulse 65   Temp 97.9 F (36.6 C) (Oral)   SpO2 94%   Visual Acuity Right Eye Distance:   Left Eye Distance:   Bilateral Distance:    Right Eye Near:   Left Eye Near:    Bilateral Near:     Physical Exam Vitals and nursing note reviewed.  Constitutional:      General: He is not in acute distress.  Appearance: Normal appearance. He is well-developed. He is not ill-appearing, toxic-appearing or diaphoretic.  HENT:     Head: Normocephalic and atraumatic.     Right Ear: Tympanic membrane and ear canal normal.     Left Ear: Tympanic membrane and ear canal normal.     Nose: Congestion (mild) present.     Right Sinus: No maxillary sinus tenderness or frontal sinus tenderness.     Left Sinus: No maxillary sinus tenderness or frontal sinus tenderness.     Comments: No temporal artery tenderness     Mouth/Throat:     Lips: Pink.     Mouth: Mucous membranes are moist.     Pharynx: Oropharynx is clear. Uvula midline. No pharyngeal swelling, oropharyngeal exudate, posterior oropharyngeal erythema or uvula swelling.  Eyes:     Extraocular Movements: Extraocular movements intact.     Conjunctiva/sclera: Conjunctivae normal.     Pupils: Pupils are equal, round, and reactive to light.  Cardiovascular:     Rate and Rhythm: Normal rate and regular rhythm.  Pulmonary:     Effort: Pulmonary effort is normal. No respiratory distress.     Breath sounds: Normal breath sounds. No stridor. No wheezing, rhonchi or rales.  Musculoskeletal:        General: Normal range of motion.     Cervical back: Normal range of motion and neck supple. No tenderness.  Lymphadenopathy:     Cervical: No cervical adenopathy.  Skin:    General: Skin is warm and dry.      Capillary Refill: Capillary refill takes less than 2 seconds.  Neurological:     General: No focal deficit present.     Mental Status: He is alert and oriented to person, place, and time.     Cranial Nerves: No cranial nerve deficit.     Sensory: No sensory deficit.     Motor: No weakness.     Coordination: Coordination normal.     Gait: Gait normal.  Psychiatric:        Mood and Affect: Mood normal.        Behavior: Behavior normal.      UC Treatments / Results  Labs (all labs ordered are listed, but only abnormal results are displayed) Labs Reviewed  NOVEL CORONAVIRUS, NAA    EKG   Radiology No results found.  Procedures Procedures (including critical care time)  Medications Ordered in UC Medications - No data to display  Initial Impression / Assessment and Plan / UC Course  I have reviewed the triage vital signs and the nursing notes.  Pertinent labs & imaging results that were available during my care of the patient were reviewed by me and considered in my medical decision making (see chart for details).    Mild congestion, otherwise normal exam O2 94%, pt denies chest pain or SOB. Lungs: CTAB Pt agreeable to COVID PCR test- pending Pt insistent symptoms improve while on antibiotics, will tx Cefdinir but stressed importance of close f/u with PCP this week, pt may benefit from head CT to rule out cause other than sinusitis contributing to recurrent headache. Pt has normal neuro exam today. Non-emergent CT not available through UC today. Discussed symptoms that warrant emergent care in the ED. AVS given  Final Clinical Impressions(s) / UC Diagnoses   Final diagnoses:  Sinus congestion  Generalized headache  Chronic sinusitis, unspecified location     Discharge Instructions      Due to your persistent headache for 2 months with only mild  temporary relief with antibiotics, it is highly recommended you call to schedule an appointment with your primary care  provider this week for recheck of symptoms and to discuss imaging such as a head CT to make sure you do not have a blood clot or mass causing your ongoing headaches.   Although you had COVID last year, unfortunately you are able to still contract the virus.  Your symptoms can be more severe and different than your virus bout of COVID.  Due to concern for possibly having Covid-19, it is advised that you self-isolate at home until test results come back, usually 2-3 days.  If positive, it is recommended you stay isolated for at least 10 days after symptom onset and 24 hours after last fever without taking medication (whichever is longer).  If you MUST go out, please wear a mask at all times, limit contact with others.   If your test is negative, you still have plenty of time to get the Covid vaccine. It is recommended you schedule an appointment to get your vaccine once you get over this current illness.  Please ask your primary care provider about any questions/concerns related to the vaccine.      ED Prescriptions    Medication Sig Dispense Auth. Provider   cefdinir (OMNICEF) 300 MG capsule Take 1 capsule (300 mg total) by mouth 2 (two) times daily for 10 days. 20 capsule Lurene Shadow, PA-C     PDMP not reviewed this encounter.   Lurene Shadow, PA-C 10/08/20 1056

## 2020-10-08 NOTE — ED Triage Notes (Signed)
Patient states that he has been fighting a sinus infection for about 2 months, been on 2 rounds of antibiotics, no energy, pressure in head, drainage in throat, finished antbs on Wednesday.  Patient has not had a CXR for pneumonia, no cough, not vaccinated, has had COVID.

## 2020-10-08 NOTE — Discharge Instructions (Signed)
  Due to your persistent headache for 2 months with only mild temporary relief with antibiotics, it is highly recommended you call to schedule an appointment with your primary care provider this week for recheck of symptoms and to discuss imaging such as a head CT to make sure you do not have a blood clot or mass causing your ongoing headaches.   Although you had COVID last year, unfortunately you are able to still contract the virus.  Your symptoms can be more severe and different than your virus bout of COVID.  Due to concern for possibly having Covid-19, it is advised that you self-isolate at home until test results come back, usually 2-3 days.  If positive, it is recommended you stay isolated for at least 10 days after symptom onset and 24 hours after last fever without taking medication (whichever is longer).  If you MUST go out, please wear a mask at all times, limit contact with others.   If your test is negative, you still have plenty of time to get the Covid vaccine. It is recommended you schedule an appointment to get your vaccine once you get over this current illness.  Please ask your primary care provider about any questions/concerns related to the vaccine.

## 2020-10-10 LAB — NOVEL CORONAVIRUS, NAA: SARS-CoV-2, NAA: NOT DETECTED

## 2020-10-10 LAB — SARS-COV-2, NAA 2 DAY TAT

## 2020-10-12 ENCOUNTER — Telehealth: Payer: Self-pay | Admitting: Emergency Medicine

## 2020-10-12 NOTE — Telephone Encounter (Signed)
Spoke w/ Bethann Berkshire regarding scheduling a follow up w/ Christen Butter, NP regarding ongoing headache symptoms - per [name]  headache is unchanged. Pt verbalized an understanding that he needed to make an appointment ASAP, but he was currently OOT thru the end of the month. RN explained he could do a virtual visit w/ Joy or request an in person visit for when he returns to the area. RN emphasized the importance of being seen as soon as possible. [name] to schedule through My Chart. Writer confirmed a CT could be done at [facility] during the week between 0800 - 1600 w/ a doctor's order. If [name] has a virtual visit then CT could be done on return to the area at the end of the month.

## 2020-10-13 ENCOUNTER — Telehealth: Payer: Medicare Other | Admitting: Emergency Medicine

## 2020-10-13 ENCOUNTER — Telehealth: Payer: Self-pay

## 2020-10-13 DIAGNOSIS — J019 Acute sinusitis, unspecified: Secondary | ICD-10-CM | POA: Diagnosis not present

## 2020-10-13 MED ORDER — AZITHROMYCIN 250 MG PO TABS: 250.0000 mg | ORAL_TABLET | Freq: Every day | ORAL | 0 refills | Status: DC

## 2020-10-13 NOTE — Progress Notes (Signed)
We are sorry that you are not feeling well.  Here is how we plan to help!  Based on what you have shared with me it looks like you have sinusitis.  Sinusitis is inflammation and infection in the sinus cavities of the head.  Based on your presentation I believe you most likely have Acute Bacterial Sinusitis.  This is an infection caused by bacteria and is treated with antibiotics. I have prescribed Azithromycin 250 mg use as directed. You may use an oral decongestant such as Mucinex D or if you have glaucoma or high blood pressure use plain Mucinex. Saline nasal spray help and can safely be used as often as needed for congestion.  If you develop worsening sinus pain, fever or notice severe headache and vision changes, or if symptoms are not better after completion of antibiotic, please schedule an appointment with a health care provider.    Sinus infections are not as easily transmitted as other respiratory infection, however we still recommend that you avoid close contact with loved ones, especially the very young and elderly.  Remember to wash your hands thoroughly throughout the day as this is the number one way to prevent the spread of infection!  Home Care:  Only take medications as instructed by your medical team.  Complete the entire course of an antibiotic.  Do not take these medications with alcohol.  A steam or ultrasonic humidifier can help congestion.  You can place a towel over your head and breathe in the steam from hot water coming from a faucet.  Avoid close contacts especially the very young and the elderly.  Cover your mouth when you cough or sneeze.  Always remember to wash your hands.  Get Help Right Away If:  You develop worsening fever or sinus pain.  You develop a severe head ache or visual changes.  Your symptoms persist after you have completed your treatment plan.  Make sure you  Understand these instructions.  Will watch your condition.  Will get help  right away if you are not doing well or get worse.  Your e-visit answers were reviewed by a board certified advanced clinical practitioner to complete your personal care plan.  Depending on the condition, your plan could have included both over the counter or prescription medications.  If there is a problem please reply  once you have received a response from your provider.  Your safety is important to [place].  If you have drug allergies check your prescription carefully.    You can use MyChart to ask questions about today's visit, request a non-urgent call back, or ask for a work or school excuse for 24 hours related to this e-Visit. If it has been greater than 24 hours you will need to follow up with your provider, or enter a new e-Visit to address those concerns.  You will get an e-mail in the next two days asking about your experience.  I hope that your e-visit has been valuable and will speed your recovery. Thank you for using e-visits.   **Please do not respond to this message unless you have follow up questions.** Greater than 5 but less than 10 minutes spent researching, coordinating, and implementing care for this patient today

## 2020-10-13 NOTE — Telephone Encounter (Signed)
Pt called and LVM stating he was seen at [facility] on 10/08/2020 and was told that he needed to get with his PCP and see about getting a head CT. I called and spoke to pt and told him that he would need to come in for an OV for evaluation prior to the CT being ordered. Pt agreeable to OV, and states he is out of town and will not be back until the end of October. Pt requested OV on 10/25/2020, and has been scheduled for 10:10 AM with a check in time of 10:00.

## 2020-10-19 NOTE — Progress Notes (Signed)
Subjective:    CC: headaches, dizziness  HPI: Pleasant 69 year old male presenting with complaints of recurrent headaches with sinus symptoms and dizziness. Was seen on 9/15 by myself and treated with Azithromycin. On 10/3, was seen via E-visit and prescribed Doxycycline. On 10/17, he was seen in UC and prescribed Cefdinir. On 10/22, another e-visit was completed and he was prescribed Azithromycin again. Did not take the last Azithromycin prescription as he was still taking the Cefdinir. He was instructed by UC to follow up with me ASAP to evaluate other causes for his symptoms but he was out of town until the end of October. Today, he reports he has daily headaches in the bilateral temples and sides of the head that feels like constant pressure. In the evening, he notes worsened sinus congestion with PND. At night he coughs, nonproductive, which makes it hard to sleep. He is having intermittent dizziness when the head pressure gets bed and after having a coughing spell. Had an episode yesterday where he got dizzy with diaphoresis and mild nausea, resolved spontaneously after sitting down and resting. Still having intermittent wheezing, albuterol not helpful. No fevers, chills, nausea, shortness of breath, chest pain, altered mental status, weakness/numbness/tingling, difficulty speaking, vision changes, or syncopal episodes.   HTN- blood pressure elevated in office today. Taking Amlodipine 10mg  daily, tolerating well, no missed doses or side effects. Does not check BP at home. [place] the beach [place] and was not as active as usual. Has been eating more and admits to some weight gain. Denies CP, SOB, palpitations, or lower extremity edema.  I reviewed the past medical history, family history, social history, surgical history, and allergies today and no changes were needed.  Please see the problem list section below in epic for further details.  Past Medical History: Past Medical History:   Diagnosis Date  . Abdominal aortic ectasia (HCC) 03/12/2018  . Allergy   . Cataract   . DDD (degenerative disc disease), lumbar   . Depression   . Hyperlipidemia   . Hypertension   . RBBB   . Renal insufficiency 02/25/2019  . Vasovagal syncope    Past Surgical History: Past Surgical History:  Procedure Laterality Date  . CATARACT EXTRACTION, BILATERAL    . KNEE ARTHROSCOPY W/ MENISCAL REPAIR Left   . ROTATOR CUFF REPAIR Right   . WISDOM TOOTH EXTRACTION     Social History: Social History   Socioeconomic History  . Marital status: Married    Spouse name: 04/27/2019  . Number of children: 1  . Years of education: 39  . Highest education level: 12th grade  Occupational History  . Occupation: 14    Comment: retired  Tobacco Use  . Smoking status: Never Smoker  . Smokeless tobacco: Never Used  Vaping Use  . Vaping Use: Never used  Substance and Sexual Activity  . Alcohol use: Yes    Comment: Occasionally, average 10 drinks/month, usually beer  . Drug use: No  . Sexual activity: Yes  Other Topics Concern  . 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 Difficulty of Paying Living Expenses: Not on file  Food Insecurity:   . Worried About Corporate investment banker in the Last Year: Not on file  . Ran Out of Food in  the Last Year: Not on file  Transportation Needs:   . Lack of Transportation (Medical): Not on file  . Lack of Transportation (Non-Medical): Not on file  Physical Activity:   . Days of Exercise per Week: Not on file  . Minutes of Exercise per Session: Not on file  Stress:   . Feeling of Stress : Not on file  Social Connections:   . Frequency of Communication with Friends and Family: Not on file  . Frequency of Social Gatherings with Friends and Family: Not on file  . Attends Religious Services: Not  on file  . Active Member of Clubs or Organizations: Not on file  . Attends Banker Meetings: Not on file  . Marital Status: Not on file   Family History: Family History  Problem Relation Age of Onset  . Heart disease Father   . Lung cancer Father   . Heart attack Brother   . Heart attack Paternal Grandfather    Allergies: Allergies  Allergen Reactions  . Codeine Other (See Comments)    Chest pain  . Paroxetine Other (See Comments)    Makes spacey  . Penicillins Diarrhea   Medications: See med rec.  Review of Systems: See HPI for pertinent positives and negatives.   Objective:    General: Well Developed, well nourished, and in no acute distress.  Neuro: Alert and oriented x3. Cranial nerves 2-12 intact on limited exam.  HEENT: Normocephalic, atraumatic. Bilateral external ear canals and TMs normal. No tenderness to maxillary/frontal sinuses. No erythema to posterior oropharynx, clear drainage with cobblestoning noted. Mildly enlarged tonsils bilaterally. No cervical lymphadenopathy or tenderness.  Skin: Warm and dry. Cardiac: Regular rate and rhythm, no murmurs rubs or gallops, no lower extremity edema.  Respiratory: Wheezing to all posterior lobes, clear anterior upper lobes bilaterally. Not using accessory muscles, speaking in full sentences.  Impression and Recommendations:    1. Recurrent headache/dizziness CT head without contrast to evaluate for other cause of daily persistent headache.  - CT Head Wo Contrast; Future  2. Need for vaccination with 13-polyvalent pneumococcal conjugate vaccine Pneumonia vaccine given today.  - Pneumococcal conjugate vaccine 13-valent IM  3. Chronic sinusitis, unspecified location Response to Azithromycin and Doxycycline while on treatment but return of symptoms once stopped. Will treat for chronic sinusitis with 20 days of Doxycycline but also evaluating for potential other causes of symptoms. Continue daily Flonase.  4.  Essential hypertension with goal blood pressure less than 140/90 Continue Amlodipine 10mg  daily. Refills sent. Recommend weight loss, exercise, and low sodium diet.  - amLODipine (NORVASC) 10 MG tablet; Take 1 tablet (10 mg total) by mouth daily.  Dispense: 90 tablet; Refill: 1  5. Allergic rhinitis, unspecified seasonality, unspecified trigger Continue Flonase. Refills sent.  - fluticasone (FLONASE) 50 MCG/ACT nasal spray; Place 2 sprays into both nostrils daily. Please give 90 day supply  Dispense: 9.9 mL; Refill: 6  Return in about 3 weeks (around 11/10/2020) for HTN, HA follow up. __________________________________________ 11/12/2020, DNP, APRN, FNP-BC Primary Care and Sports Medicine [facility]

## 2020-10-20 ENCOUNTER — Other Ambulatory Visit: Payer: Self-pay

## 2020-10-20 ENCOUNTER — Ambulatory Visit (INDEPENDENT_AMBULATORY_CARE_PROVIDER_SITE_OTHER): Payer: Medicare Other

## 2020-10-20 ENCOUNTER — Encounter: Payer: Self-pay | Admitting: Medical-Surgical

## 2020-10-20 ENCOUNTER — Ambulatory Visit (INDEPENDENT_AMBULATORY_CARE_PROVIDER_SITE_OTHER): Payer: Medicare Other | Admitting: Medical-Surgical

## 2020-10-20 VITALS — BP 142/72 | HR 66 | Temp 98.1°F | Ht 71.0 in | Wt 248.9 lb

## 2020-10-20 DIAGNOSIS — R519 Headache, unspecified: Secondary | ICD-10-CM

## 2020-10-20 DIAGNOSIS — Z23 Encounter for immunization: Secondary | ICD-10-CM | POA: Diagnosis not present

## 2020-10-20 DIAGNOSIS — R0989 Other specified symptoms and signs involving the circulatory and respiratory systems: Secondary | ICD-10-CM

## 2020-10-20 DIAGNOSIS — I10 Essential (primary) hypertension: Secondary | ICD-10-CM

## 2020-10-20 DIAGNOSIS — J329 Chronic sinusitis, unspecified: Secondary | ICD-10-CM | POA: Diagnosis not present

## 2020-10-20 DIAGNOSIS — J309 Allergic rhinitis, unspecified: Secondary | ICD-10-CM

## 2020-10-20 DIAGNOSIS — R42 Dizziness and giddiness: Secondary | ICD-10-CM

## 2020-10-20 MED ORDER — DOXYCYCLINE HYCLATE 100 MG PO TABS: ORAL_TABLET | ORAL | 0 refills | Status: DC

## 2020-10-20 MED ORDER — FLUTICASONE PROPIONATE 50 MCG/ACT NA SUSP: 2.0000 | Freq: Every day | NASAL | 6 refills | Status: DC

## 2020-10-20 MED ORDER — AMLODIPINE BESYLATE 10 MG PO TABS: 10.0000 mg | ORAL_TABLET | Freq: Every day | ORAL | 1 refills | Status: DC

## 2020-10-20 NOTE — Patient Instructions (Signed)
Pneumococcal Conjugate Vaccine (PCV13): What You Need to Know 1. Why get vaccinated? Pneumococcal conjugate vaccine (PCV13) can prevent pneumococcal disease. Pneumococcal disease refers to any illness caused by pneumococcal bacteria. These bacteria can cause many types of illnesses, including pneumonia, which is an infection of the lungs. Pneumococcal bacteria are one of the most common causes of pneumonia. Besides pneumonia, pneumococcal bacteria can also cause:  Ear infections  Sinus infections  Meningitis (infection of the tissue covering the brain and spinal cord)  Bacteremia (bloodstream infection) Anyone can get pneumococcal disease, but children under 2 years of age, people with certain medical conditions, adults 65 years or older, and cigarette smokers are at the highest risk. Most pneumococcal infections are mild. However, some can result in long-term problems, such as brain damage or hearing loss. Meningitis, bacteremia, and pneumonia caused by pneumococcal disease can be fatal. 2. PCV13 PCV13 protects against 13 types of bacteria that cause pneumococcal disease. Infants and young children usually need 4 doses of pneumococcal conjugate vaccine, at 2, 4, 6, and 12-15 months of age. In some cases, a child might need fewer than 4 doses to complete PCV13 vaccination. A dose of PCV23 vaccine is also recommended for anyone 2 years or older with certain medical conditions if they did not already receive PCV13. This vaccine may be given to adults 65 years or older based on discussions between the patient and health care provider. 3. Talk with your health care provider Tell your vaccine provider if the person getting the vaccine:  Has had an allergic reaction after a previous dose of PCV13, to an earlier pneumococcal conjugate vaccine known as PCV7, or to any vaccine containing diphtheria toxoid (for example, DTaP), or has any severe, life-threatening allergies.  In some cases, your health  care provider may decide to postpone PCV13 vaccination to a future visit. People with minor illnesses, such as a cold, may be vaccinated. People who are moderately or severely ill should usually wait until they recover before getting PCV13. Your health care provider can give you more information. 4. Risks of a vaccine reaction  Redness, swelling, pain, or tenderness where the shot is given, and fever, loss of appetite, fussiness (irritability), feeling tired, headache, and chills can happen after PCV13. Young children may be at increased risk for seizures caused by fever after PCV13 if it is administered at the same time as inactivated influenza vaccine. Ask your health care provider for more information. People sometimes faint after medical procedures, including vaccination. Tell your provider if you feel dizzy or have vision changes or ringing in the ears. As with any medicine, there is a very remote chance of a vaccine causing a severe allergic reaction, other serious injury, or death. 5. What if there is a serious problem? An allergic reaction could occur after the vaccinated person leaves the clinic. If you see signs of a severe allergic reaction (hives, swelling of the face and throat, difficulty breathing, a fast heartbeat, dizziness, or weakness), call 9-1-1 and get the person to the nearest hospital. For other signs that concern you, call your health care provider. Adverse reactions should be reported to the Vaccine Adverse Event Reporting System (VAERS). Your health care provider will usually file this report, or you can do it yourself. Visit the VAERS website at www.vaers.hhs.gov or call 1-800-822-7967. VAERS is only for reporting reactions, and VAERS staff do not give medical advice. 6. The National Vaccine Injury Compensation Program The National Vaccine Injury Compensation Program (VICP) is a federal program   that was created to compensate people who may have been injured by certain  vaccines. Visit the VICP website at www.hrsa.gov/vaccinecompensation or call 1-800-338-2382 to learn about the program and about filing a claim. There is a time limit to file a claim for compensation. 7. How can I learn more?  Ask your health care provider.  Call your local or state health department.  Contact the Centers for Disease Control and Prevention (CDC): ? Call 1-800-232-4636 (1-800-CDC-INFO) or ? Visit CDC's website at www.cdc.gov/vaccines Vaccine Information Statement PCV13 Vaccine (10/21/2018) This information is not intended to replace advice given to you by your health care provider. Make sure you discuss any questions you have with your health care provider. Document Revised: 03/30/2019 Document Reviewed: 07/21/2018 Elsevier Patient Education  2020 Elsevier Inc.   

## 2020-10-23 NOTE — Telephone Encounter (Signed)
Pt had an OV with Joy on 10/20/2020 in which a CT of the head was ordered.

## 2020-10-25 ENCOUNTER — Ambulatory Visit: Payer: Medicare Other | Admitting: Medical-Surgical

## 2020-10-28 ENCOUNTER — Emergency Department (INDEPENDENT_AMBULATORY_CARE_PROVIDER_SITE_OTHER)
Admission: RE | Admit: 2020-10-28 | Discharge: 2020-10-28 | Disposition: A | Discharge status: Home / Self Care | Payer: Medicare Other | Source: Ambulatory Visit | Attending: Family Medicine | Admitting: Family Medicine

## 2020-10-28 ENCOUNTER — Other Ambulatory Visit: Payer: Self-pay

## 2020-10-28 VITALS — BP 162/84 | HR 68 | Temp 97.5°F | Ht 71.0 in | Wt 245.0 lb

## 2020-10-28 DIAGNOSIS — J9809 Other diseases of bronchus, not elsewhere classified: Secondary | ICD-10-CM

## 2020-10-28 MED ORDER — BUDESONIDE-FORMOTEROL FUMARATE 80-4.5 MCG/ACT IN AERO: 2.0000 | INHALATION_SPRAY | Freq: Two times a day (BID) | RESPIRATORY_TRACT | 1 refills | Status: DC

## 2020-10-28 MED ORDER — PREDNISONE 20 MG PO TABS: ORAL_TABLET | ORAL | 0 refills | Status: DC

## 2020-10-28 MED ORDER — METHYLPREDNISOLONE SODIUM SUCC 125 MG IJ SOLR: 80.0000 mg | Freq: Once | INTRAMUSCULAR | Status: DC

## 2020-10-28 NOTE — ED Triage Notes (Signed)
x1 month. Pt states that he has been having a cough and some wheezing. Pt states that  He has been dealing with this for about 1 month.

## 2020-10-28 NOTE — ED Provider Notes (Signed)
[facility]    CSN: [id number] Arrival date & time: 10/28/20  1356      History   Chief Complaint Chief Complaint  Patient presents with  . Cough    HPI Lance Anderson is a 69 y.o. male.   Patient complains of cough/wheezing for about a month but feels well otherwise.  He denies pleuritic pain, shortness of breath, and fevers, chills, and sweats. Review of records reveals that patient was seen by his PCP on 09/05/20 with a similar complaint and wheezing on exam.  A chest x-ray that date was negative and he was prescribed an albuterol inhaler.  He reports that the inhaler was not especially helpful. He is presently nearing the end of a 20 day course of doxycycline for chronic sinusitis.  The history is provided by the patient. The history is limited by the condition of the patient.    Past Medical History:  Diagnosis Date  . Abdominal aortic ectasia (HCC) 03/12/2018  . Allergy   . Cataract   . DDD (degenerative disc disease), lumbar   . Depression   . Hyperlipidemia   . Hypertension   . RBBB   . Renal insufficiency 02/25/2019  . Vasovagal syncope     Patient Active Problem List   Diagnosis Date Noted  . COVID-19 virus infection 12/20/2019  . Renal insufficiency 02/25/2019  . 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 Cervical radiculopathy 10/30/2018  . Right bundle branch block (RBBB) 05/29/2018  . Sinus bradycardia 05/29/2018  . Peripheral edema 04/27/2018  . Venous stasis dermatitis of right lower extremity 04/27/2018  . Strain of calf muscle 04/27/2018  . Abdominal aortic ectasia (HCC) 03/12/2018  . 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 Recurrent major depressive disorder, in full remission (HCC) 02/25/2018  . Encounter for long-term (current) use of medications 02/25/2018  . Encounter for abdominal aortic aneurysm (AAA) screening 02/25/2018  . Dyslipidemia, goal LDL below 100 02/25/2018  . Refused influenza vaccine  02/25/2018  . Refused pneumococcal vaccination 02/25/2018  . Pruritus 08/04/2017  . Encounter for monitoring statin therapy 08/04/2017  . Depression 05/21/2017  . Family history of heart disease in male family member before age 65 05/21/2017  . Tear of meniscus of knee 05/21/2017  . Class 1 obesity due to excess calories with serious comorbidity in adult 05/21/2017  . Allergic rhinitis 05/21/2017  . DDD (degenerative disc disease), lumbosacral 05/21/2017  . Vasovagal episode 05/21/2017  . Essential hypertension with goal blood pressure less than 140/90 08/17/2015  . Pseudophakia of both eyes 10/20/2013  . Dyslipidemia 08/10/2013    Past Surgical History:  Procedure Laterality Date  . CATARACT EXTRACTION, BILATERAL    . KNEE ARTHROSCOPY W/ MENISCAL REPAIR Left   . ROTATOR CUFF REPAIR Right   . WISDOM TOOTH EXTRACTION         Home Medications    Prior to Admission medications   Medication Sig Start Date End Date Taking? Authorizing Provider  albuterol (VENTOLIN HFA) 108 (90 Base) MCG/ACT inhaler Inhale 2 puffs into the lungs every 6 (six) hours as needed for wheezing. 09/06/20  Yes Christen Butter, NP  amLODipine (NORVASC) 10 MG tablet Take 1 tablet (10 mg total) by mouth daily. 10/20/20  Yes Christen Butter, NP  aspirin 81 MG EC tablet Take 1 tablet (81 mg total) by mouth daily. 02/25/18  Yes Carlis Stable, PA-C  atorvastatin (LIPITOR) 40 MG tablet TAKE 1 TABLET BY MOUTH AT BEDTIME 08/24/20  Yes Christen Butter, NP  doxycycline (  VIBRA-TABS) 100 MG tablet Take 2 tablets on day 1 then 1 tablet on days 2-20 10/20/20  Yes Jessup, Joy, NP  FLUoxetine (PROZAC) 20 MG capsule Take 1 capsule (20 mg total) by mouth daily. 08/14/20  Yes Christen Butter, NP  fluticasone (FLONASE) 50 MCG/ACT nasal spray Place 2 sprays into both nostrils daily. Please give 90 day supply 10/20/20  Yes Christen Butter, NP  naproxen (NAPROSYN) 500 MG tablet Take 1 tablet by mouth twice daily as needed for pain 06/27/20  Yes  Christen Butter, NP  budesonide-formoterol (SYMBICORT) 80-4.5 MCG/ACT inhaler Inhale 2 puffs into the lungs 2 (two) times daily. 10/28/20   Lattie Haw, MD  predniSONE (DELTASONE) 20 MG tablet Take one tab by mouth twice daily for 4 days, then one daily for 3 days. Take with food. 10/28/20   Lattie Haw, MD    Family History Family History  Problem Relation Age of Onset  . Heart disease Father   . Lung cancer Father   . Heart attack Brother   . Heart attack Paternal Grandfather     Social History Social History   Tobacco Use  . Smoking status: Never Smoker  . Smokeless tobacco: Never Used  Vaping Use  . Vaping Use: Never used  Substance Use Topics  . Alcohol use: Yes    Comment: Occasionally, average 10 drinks/month, usually beer  . Drug use: No     Allergies   Codeine, Paroxetine, and Penicillins   Review of Systems Review of Systems No sore throat + cough No pleuritic pain + wheezing + nasal congestion ? post-nasal drainage No sinus pain/pressure No itchy/red eyes No earache No hemoptysis No SOB No fever/chills No nausea No vomiting No abdominal pain No diarrhea No urinary symptoms No skin rash No fatigue No myalgias No headache   Physical Exam Triage Vital Signs ED Triage Vitals  Enc Vitals Group     BP 10/28/20 1518 (!) 162/84     Pulse Rate 10/28/20 1518 68     Resp --      Temp 10/28/20 1518 (!) 97.5 F (36.4 C)     Temp Source 10/28/20 1518 Oral     SpO2 10/28/20 1518 97 %     Weight 10/28/20 1517 245 lb (111.1 kg)     Height 10/28/20 1517 5\' 11"  (1.803 m)     Head Circumference --      Peak Flow --      Pain Score 10/28/20 1516 0     Pain Loc --      Pain Edu? --      Excl. in GC? --    No data found.  Updated Vital Signs BP (!) 162/84 (BP Location: Right Arm)   Pulse 68   Temp (!) 97.5 F (36.4 C) (Oral)   Ht 5\' 11"  (1.803 m)   Wt 111.1 kg   SpO2 97%   BMI 34.17 kg/m   Visual Acuity Right Eye Distance:   Left Eye  Distance:   Bilateral Distance:    Right Eye Near:   Left Eye Near:    Bilateral Near:     Physical Exam Nursing notes and Vital Signs reviewed. Appearance:  Patient appears stated age, and in no acute distress Eyes:  Pupils are equal, round, and reactive to light and accomodation.  Extraocular movement is intact.  Conjunctivae are not inflamed  Ears:  Canals normal.  Tympanic membranes normal.  Nose:  Congested turbinates.  No sinus tenderness. Pharynx:  Normal Neck:  Supple. No adenopathy. Lungs:  Diffuse bilateral wheezes.  Breath sounds are equal.  Moving air well. Heart:  Regular rate and rhythm without murmurs, rubs, or gallops.  Abdomen:  Nontender without masses or hepatosplenomegaly.  Bowel sounds are present.  No CVA or flank tenderness.  Extremities:  No edema.  Skin:  No rash present.   UC Treatments / Results  Labs (all labs ordered are listed, but only abnormal results are displayed) Labs Reviewed - No data to display  EKG   Radiology No results found.  Procedures Procedures (including critical care time)  Medications Ordered in UC Medications  methylPREDNISolone sodium succinate (SOLU-MEDROL) 125 mg/2 mL injection 80 mg (has no administration in time range)    Initial Impression / Assessment and Plan / UC Course  I have reviewed the triage vital signs and the nursing notes.  Pertinent labs & imaging results that were available during my care of the patient were reviewed by me and considered in my medical decision making (see chart for details).    There is no evidence of bacterial infection today.   Administered Solumedrol 80mg  IM, thern begin prednisone burst/taper. Add Symbicort. Followup with Family Doctor if not improved in about two weeks.   Final Clinical Impressions(s) / UC Diagnoses   Final diagnoses:  Recurrent bronchospasm     Discharge Instructions     Begin prednisone Sunday 10/29/20. Finish doxycycline. Continue taking plain  Mucinex.    ED Prescriptions    Medication Sig Dispense Auth. Provider   budesonide-formoterol (SYMBICORT) 80-4.5 MCG/ACT inhaler Inhale 2 puffs into the lungs 2 (two) times daily. 1 each 13/7/21, MD   predniSONE (DELTASONE) 20 MG tablet Take one tab by mouth twice daily for 4 days, then one daily for 3 days. Take with food. 11 tablet Lattie Haw, MD        Lattie Haw, MD 10/31/20 [phone]

## 2020-10-28 NOTE — Discharge Instructions (Signed)
Begin prednisone Sunday 10/29/20. Finish doxycycline. Continue taking plain Mucinex.

## 2020-11-10 ENCOUNTER — Other Ambulatory Visit: Payer: Self-pay

## 2020-11-10 ENCOUNTER — Ambulatory Visit (INDEPENDENT_AMBULATORY_CARE_PROVIDER_SITE_OTHER): Payer: Medicare Other | Admitting: Medical-Surgical

## 2020-11-10 ENCOUNTER — Encounter: Payer: Self-pay | Admitting: Medical-Surgical

## 2020-11-10 VITALS — BP 137/82 | HR 63 | Temp 97.9°F | Ht 71.0 in | Wt 249.3 lb

## 2020-11-10 DIAGNOSIS — R5383 Other fatigue: Secondary | ICD-10-CM

## 2020-11-10 DIAGNOSIS — R0609 Other forms of dyspnea: Secondary | ICD-10-CM

## 2020-11-10 DIAGNOSIS — R42 Dizziness and giddiness: Secondary | ICD-10-CM | POA: Diagnosis not present

## 2020-11-10 DIAGNOSIS — R0600 Dyspnea, unspecified: Secondary | ICD-10-CM | POA: Diagnosis not present

## 2020-11-10 DIAGNOSIS — I10 Essential (primary) hypertension: Secondary | ICD-10-CM | POA: Diagnosis not present

## 2020-11-10 DIAGNOSIS — R0683 Snoring: Secondary | ICD-10-CM | POA: Diagnosis not present

## 2020-11-10 MED ORDER — SPACER/AERO-HOLDING CHAMBERS DEVI: 1.0000 | Freq: Once | 0 refills | Status: AC

## 2020-11-10 NOTE — Progress Notes (Signed)
Subjective:    CC: not feeling better  HPI: Pleasant 69 year old male accompanied by his wife presenting today for evaluation.  Notes that he has been struggling with sinus congestion/drainage and headaches with wheezing and coughing spells since August.  He has been treated multiple times with antibiotics as well as a run of prednisone.  He was seen by urgent care on 11/6 for recurring bronchospasms.  They gave him a burst of prednisone and a corticosteroid inhaler.  Since then he notes that his wheezing has resolved.  He completed his doxycycline 21-day course 2 days ago.  Has noted an improvement in sinus drainage and congestion.  No longer having pressure like headaches.  Unfortunately he is feeling poorly.  Notes that he gets very fatigued quickly and has been experiencing some brain fog.  He is also having shortness of breath on exertion which has been occurring over the past several months.  He does get dizzy with coughing spells.  Also notes the last 1 to 2 weeks he has had a raw feeling to his throat.  Denies fevers, chills, shortness of breath at rest, chest pains, and GI symptoms.  History of right bundle branch block and sinus bradycardia.  Last evaluation by cardiology in 2017 where he had a exercise tolerance test.  He did have Covid in December 2020 and wonders if his symptoms may be a result of long Covid.  I reviewed the past medical history, family history, social history, surgical history, and allergies today and no changes were needed.  Please see the problem list section below in epic for further details.  Past Medical History: Past Medical History:  Diagnosis Date  . Abdominal aortic ectasia (HCC) 03/12/2018  . Allergy   . Cataract   . DDD (degenerative disc disease), lumbar   . Depression   . Hyperlipidemia   . Hypertension   . RBBB   . Renal insufficiency 02/25/2019  . Vasovagal syncope    Past Surgical History: Past Surgical History:  Procedure Laterality Date  .  CATARACT EXTRACTION, BILATERAL    . KNEE ARTHROSCOPY W/ MENISCAL REPAIR Left   . ROTATOR CUFF REPAIR Right   . WISDOM TOOTH EXTRACTION     Social History: Social History   Socioeconomic History  . Marital status: Married    Spouse name: Joyce Gross  . Number of children: 1  . Years of education: 38  . Highest education level: 12th grade  Occupational History  . Occupation: Curator    Comment: retired  Tobacco Use  . Smoking status: Never Smoker  . Smokeless tobacco: Never Used  Vaping Use  . Vaping Use: Never used  Substance and Sexual Activity  . Alcohol use: Yes    Comment: Occasionally, average 10 drinks/month, usually beer  . Drug use: No  . Sexual activity: Yes  Other Topics Concern  . 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 Difficulty of Paying Living Expenses: Not on file  Food Insecurity:   . Worried About Programme researcher, broadcasting/film/video in the Last Year: Not on file  . Ran Out of Food in the Last Year: Not on file  Transportation Needs:   . Lack of Transportation (Medical): Not on file  . Lack of Transportation (Non-Medical): Not on file  Physical Activity:   .  Days of Exercise per Week: Not on file  . Minutes of Exercise per Session: Not on file  Stress:   . Feeling of Stress : Not on file  Social Connections:   . Frequency of Communication with Friends and Family: Not on file  . Frequency of Social Gatherings with Friends and Family: Not on file  . Attends Religious Services: Not on file  . Active Member of Clubs or Organizations: Not on file  . Attends Banker Meetings: Not on file  . Marital Status: Not on file   Family History: Family History  Problem Relation Age of Onset  . Heart disease Father   . Lung cancer Father   . Heart attack Brother   . Heart attack Paternal Grandfather     Allergies: Allergies  Allergen Reactions  . Codeine Other (See Comments)    Chest pain  . Paroxetine Other (See Comments)    Makes spacey  . Penicillins Diarrhea   Medications: See med rec.  Review of Systems: See HPI for pertinent positives and negatives.   Objective:    General: Well Developed, well nourished, and in no acute distress.  Neuro: Alert and oriented x3, extra-ocular muscles intact, sensation grossly intact.  HEENT: Normocephalic, atraumatic, pupils equal round reactive to light, neck supple, no masses, no lymphadenopathy, thyroid nonpalpable.  Skin: Warm and dry. Cardiac: Regular rate and rhythm, no murmurs rubs or gallops, no lower extremity edema.  Respiratory: Clear to auscultation bilaterally. Not using accessory muscles, speaking in full sentences.  Impression and Recommendations:    1. Dyspnea on exertion/fatigue/dizziness Checking CBC, CMP, and TSH today.  Twelve-lead EKG done in office showing sinus bradycardia with rate of 54, right bundle branch block.  Possible pulmonary etiology so bringing him back for spirometry.  Recent chest x-ray normal. - CBC - COMPLETE METABOLIC PANEL WITH GFR - EKG 42-ASTM  2. Essential hypertension with goal blood pressure less than 140/90 Initial blood pressure check not at goal but on recheck was 137/82.  Checking labs as below. - CBC - COMPLETE METABOLIC PANEL WITH GFR  3. Loud snoring Since his last sleep study was approximately 15 years ago, we will get a Home sleep study.  He has a CPAP at home (told he did not need it but could try to see if it helped) that he does not wear and this may be contributing to some of his symptoms.  Return for spirometry/PFTs at your convenience. ___________________________________________ Thayer Ohm, DNP, APRN, FNP-BC Primary Care and Sports Medicine [facility] [place]

## 2020-11-11 LAB — COMPLETE METABOLIC PANEL WITH GFR
AG Ratio: 1.3 (calc) (ref 1.0–2.5)
ALT: 19 U/L (ref 9–46)
AST: 20 U/L (ref 10–35)
Albumin: 3.8 g/dL (ref 3.6–5.1)
Alkaline phosphatase (APISO): 105 U/L (ref 35–144)
BUN: 20 mg/dL (ref 7–25)
CO2: 27 mmol/L (ref 20–32)
Calcium: 8.8 mg/dL (ref 8.6–10.3)
Chloride: 105 mmol/L (ref 98–110)
Creat: 1.04 mg/dL (ref 0.70–1.25)
GFR, Est African American: 86 mL/min/{1.73_m2} (ref >=60)
GFR, Est Non African American: 74 mL/min/{1.73_m2} (ref >=60)
Globulin: 2.9 g/dL (calc) (ref 1.9–3.7)
Glucose, Bld: 96 mg/dL (ref 65–139)
Potassium: 4.7 mmol/L (ref 3.5–5.3)
Sodium: 139 mmol/L (ref 135–146)
Total Bilirubin: 0.4 mg/dL (ref 0.2–1.2)
Total Protein: 6.7 g/dL (ref 6.1–8.1)

## 2020-11-11 LAB — CBC
HCT: 42.4 % (ref 38.5–50.0)
Hemoglobin: 14.3 g/dL (ref 13.2–17.1)
MCH: 31.6 pg (ref 27.0–33.0)
MCHC: 33.7 g/dL (ref 32.0–36.0)
MCV: 93.8 fL (ref 80.0–100.0)
MPV: 10.8 fL (ref 7.5–12.5)
Platelets: 243 10*3/uL (ref 140–400)
RBC: 4.52 10*6/uL (ref 4.20–5.80)
RDW: 12.5 % (ref 11.0–15.0)
WBC: 9.1 10*3/uL (ref 3.8–10.8)

## 2020-11-11 LAB — TSH: TSH: 2.29 mIU/L (ref 0.40–4.50)

## 2020-11-14 ENCOUNTER — Other Ambulatory Visit: Payer: Self-pay | Admitting: Medical-Surgical

## 2020-11-14 DIAGNOSIS — M5137 Other intervertebral disc degeneration, lumbosacral region: Secondary | ICD-10-CM

## 2020-11-19 ENCOUNTER — Other Ambulatory Visit: Payer: Self-pay | Admitting: Medical-Surgical

## 2020-11-19 DIAGNOSIS — F3342 Major depressive disorder, recurrent, in full remission: Secondary | ICD-10-CM

## 2020-11-20 ENCOUNTER — Telehealth: Payer: Self-pay

## 2020-11-20 NOTE — Telephone Encounter (Signed)
Pt's wife Joyce Gross (on DPR//ROI/HIPPA form) called with concerns regarding her husbands dyspnea that he has had for the last several months. Pt is scheduled as a NV for spirometry testing day after tomorrow and she wants to make sure that Ander Slade will be reviewing the results after the testing. I told her that once all of the tests are done that the reports will be taken to [facility] for review prior to him leaving the appt. She then asked if Ander Slade will refer him to Pulmonology because she thinks he needs to go since he has had this breathing problems since August. I told her that Ander Slade will review the results of the Spirometry and discuss it with him when he comes in for the NV and that he can ask Joy about a referral. Joyce Gross then said he needs to go. I told her that a referral can be discussed between [place] and Ander Slade because the pt and provider need to be in agreement for the referral, but I would be happy to send a message to Joy and let her know of Kay's concerns prior to him coming in for the NV. Joyce Gross then asked if a referral is done how long will it take for him to get in with them and I told her that it would depend on the specialist office and their scheduling. I told her that if a referral is done that it would go to the specialists office and it depends on their scheduling and that we cannot promise he would be seen in, for example, 3 days because it depends on that practice. She then said that I did not have to be mean about telling her all of this. I apologized if she misunderstood the information or if she felt I was being mean, but there are protocols in place for these things to be done and that I was just sharing the information with her, and I apologized again if she felt I was being mean. She then said "One day this is going to come around to you, you will get yours" and then disconnected the call. I shared this information with Joy and told her I would send this message to let her know of Kay's concerns as well as  document the interaction.

## 2020-11-20 NOTE — Telephone Encounter (Signed)
Once the spirometry is done, we will most likely refer him to pulmonology for further evaluation. The spirometry will give [place] a better idea of current lung function and any obvious causes for symptoms. We may also need to look into cardiology as heart concerns are often a reason for shortness of breath. I am unable to estimate the time it takes to get into either of these places. I will see him and go over the results with him after he completes his test. I understand that she has concerns and that it is hard to watch your loved one having health issues. We will do everything we can to get to the bottom of his symptoms.

## 2020-11-21 NOTE — Telephone Encounter (Signed)
Pt aware of Joy's response. No further questions or concerns at this time. Reminded pt of NV time tomorrow for spirometry testing.

## 2020-11-22 ENCOUNTER — Ambulatory Visit (INDEPENDENT_AMBULATORY_CARE_PROVIDER_SITE_OTHER): Payer: Medicare Other | Admitting: Medical-Surgical

## 2020-11-22 VITALS — BP 158/75 | HR 70 | Ht 71.0 in | Wt 249.0 lb

## 2020-11-22 DIAGNOSIS — R490 Dysphonia: Secondary | ICD-10-CM | POA: Diagnosis not present

## 2020-11-22 DIAGNOSIS — R0683 Snoring: Secondary | ICD-10-CM | POA: Diagnosis not present

## 2020-11-22 DIAGNOSIS — R059 Cough, unspecified: Secondary | ICD-10-CM

## 2020-11-22 DIAGNOSIS — R0609 Other forms of dyspnea: Secondary | ICD-10-CM

## 2020-11-22 DIAGNOSIS — R062 Wheezing: Secondary | ICD-10-CM

## 2020-11-22 DIAGNOSIS — R0600 Dyspnea, unspecified: Secondary | ICD-10-CM

## 2020-11-22 MED ORDER — ALBUTEROL SULFATE HFA 108 (90 BASE) MCG/ACT IN AERS
4.0000 | INHALATION_SPRAY | Freq: Once | RESPIRATORY_TRACT | Status: AC
  Administered 2020-11-22: 4 via RESPIRATORY_TRACT

## 2020-11-22 NOTE — Progress Notes (Signed)
Subjective:    CC: spirometry completion  HPI: Pleasant 69 year old male accompanied by his wife presenting for completion of in-office spirometry. Continues to have dyspnea on exertion that is limiting his normal activities such as playing with his grandkids, operating a chainsaw, and other physically active tasks. Cough is non-productive and mostly during the day. Voice continues to be hoarse. No upper respiratory congestion, PND, or other etiology identified for voice change. Denies globus sensation but endorses a tickle in the back of his throat.   I reviewed the past medical history, family history, social history, surgical history, and allergies today and no changes were needed.  Please see the problem list section below in epic for further details.  Past Medical History: Past Medical History:  Diagnosis Date  . Abdominal aortic ectasia (HCC) 03/12/2018  . Allergy   . Cataract   . DDD (degenerative disc disease), lumbar   . Depression   . Hyperlipidemia   . Hypertension   . RBBB   . Renal insufficiency 02/25/2019  . Vasovagal syncope    Past Surgical History: Past Surgical History:  Procedure Laterality Date  . CATARACT EXTRACTION, BILATERAL    . KNEE ARTHROSCOPY W/ MENISCAL REPAIR Left   . ROTATOR CUFF REPAIR Right   . WISDOM TOOTH EXTRACTION     Social History: Social History   Socioeconomic History  . Marital status: Married    Spouse name: Joyce Gross  . Number of children: 1  . Years of education: 30  . Highest education level: 12th grade  Occupational History  . Occupation: Curator    Comment: retired  Tobacco Use  . Smoking status: Never Smoker  . Smokeless tobacco: Never Used  Vaping Use  . Vaping Use: Never used  Substance and Sexual Activity  . Alcohol use: Yes    Comment: Occasionally, average 10 drinks/month, usually beer  . Drug use: No  . Sexual activity: Yes  Other Topics Concern  . 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 Difficulty of Paying Living Expenses: Not on file  Food Insecurity:   . Worried About Programme researcher, broadcasting/film/video in the Last Year: Not on file  . Ran Out of Food in the Last Year: Not on file  Transportation Needs:   . Lack of Transportation (Medical): Not on file  . Lack of Transportation (Non-Medical): Not on file  Physical Activity:   . Days of Exercise per Week: Not on file  . Minutes of Exercise per Session: Not on file  Stress:   . Feeling of Stress : Not on file  Social Connections:   . Frequency of Communication with Friends and Family: Not on file  . Frequency of Social Gatherings with Friends and Family: Not on file  . Attends Religious Services: Not on file  . Active Member of Clubs or Organizations: Not on file  . Attends Banker Meetings: Not on file  . Marital Status: Not on file   Family History: Family History  Problem Relation Age of Onset  . Heart disease Father   . Lung cancer Father   . Heart attack Brother   . Heart attack Paternal Grandfather    Allergies: Allergies  Allergen Reactions  . Codeine Other (See Comments)    Chest pain  .  Paroxetine Other (See Comments)    Makes spacey  . Penicillins Diarrhea   Medications: See med rec.  Review of Systems: See HPI for pertinent positives and negatives.   Objective:    General: Well Developed, well nourished, and in no acute distress.  Neuro: Alert and oriented x3.  HEENT: Normocephalic, atraumatic.  Skin: Warm and dry. Cardiac: Regular rate and rhythm, no murmurs rubs or gallops, no lower extremity edema.  Respiratory: Clear to auscultation bilaterally. Not using accessory muscles, speaking in full sentences.   Impression and Recommendations:    1.DOE/Cough/Wheezing/Hoarse voice quality Spirometry completed. FEV1 at 101% with FEV1/FVC at 113%.  Noted to have 13% improvement in FEV1 with bronchodilator. Normal spirometry overall but symptoms very concerning due to limitation in normal activities. Referring to pulmonology and ENT for further evaluation.  - PR EVAL OF BRONCHOSPASM - albuterol (VENTOLIN HFA) 108 (90 Base) MCG/ACT inhaler 4 puff - Ambulatory referral to Pulmonology - Ambulatory referral to ENT  2. Loud snoring Has decided to go ahead with sleep study. Ordering a split night study to allow for possible fitting of CPAP at that time if necessary.  - Split night study; Future  Return if symptoms worsen or fail to improve. ___________________________________________ Thayer Ohm, DNP, APRN, FNP-BC Primary Care and Sports Medicine [facility] [place]

## 2020-12-07 ENCOUNTER — Other Ambulatory Visit: Payer: Self-pay

## 2020-12-07 DIAGNOSIS — E785 Hyperlipidemia, unspecified: Secondary | ICD-10-CM

## 2020-12-07 DIAGNOSIS — J309 Allergic rhinitis, unspecified: Secondary | ICD-10-CM

## 2020-12-07 DIAGNOSIS — K219 Gastro-esophageal reflux disease without esophagitis: Secondary | ICD-10-CM | POA: Diagnosis not present

## 2020-12-07 DIAGNOSIS — I10 Essential (primary) hypertension: Secondary | ICD-10-CM

## 2020-12-07 DIAGNOSIS — M5137 Other intervertebral disc degeneration, lumbosacral region: Secondary | ICD-10-CM

## 2020-12-07 MED ORDER — NAPROXEN 500 MG PO TABS: 500.0000 mg | ORAL_TABLET | Freq: Two times a day (BID) | ORAL | 0 refills | Status: DC | PRN

## 2020-12-07 MED ORDER — AMLODIPINE BESYLATE 10 MG PO TABS: 10.0000 mg | ORAL_TABLET | Freq: Every day | ORAL | 1 refills | Status: DC

## 2020-12-07 MED ORDER — FLUTICASONE PROPIONATE 50 MCG/ACT NA SUSP: 2.0000 | Freq: Every day | NASAL | 6 refills | Status: DC

## 2020-12-07 MED ORDER — ATORVASTATIN CALCIUM 40 MG PO TABS: 40.0000 mg | ORAL_TABLET | Freq: Every day | ORAL | 1 refills | Status: DC

## 2020-12-20 DIAGNOSIS — R0683 Snoring: Secondary | ICD-10-CM | POA: Diagnosis not present

## 2020-12-20 DIAGNOSIS — G4710 Hypersomnia, unspecified: Secondary | ICD-10-CM | POA: Diagnosis not present

## 2021-01-02 DIAGNOSIS — G4733 Obstructive sleep apnea (adult) (pediatric): Secondary | ICD-10-CM | POA: Diagnosis not present

## 2021-01-02 DIAGNOSIS — R0683 Snoring: Secondary | ICD-10-CM | POA: Diagnosis not present

## 2021-01-02 DIAGNOSIS — G4710 Hypersomnia, unspecified: Secondary | ICD-10-CM | POA: Diagnosis not present

## 2021-01-05 ENCOUNTER — Encounter: Payer: Self-pay | Admitting: Pulmonary Disease

## 2021-01-05 ENCOUNTER — Other Ambulatory Visit: Payer: Self-pay

## 2021-01-05 ENCOUNTER — Ambulatory Visit (INDEPENDENT_AMBULATORY_CARE_PROVIDER_SITE_OTHER): Payer: Medicare Other | Admitting: Pulmonary Disease

## 2021-01-05 VITALS — BP 128/82 | HR 58 | Temp 98.2°F | Ht 71.0 in | Wt 249.4 lb

## 2021-01-05 DIAGNOSIS — G4733 Obstructive sleep apnea (adult) (pediatric): Secondary | ICD-10-CM

## 2021-01-05 DIAGNOSIS — Z9989 Dependence on other enabling machines and devices: Secondary | ICD-10-CM | POA: Diagnosis not present

## 2021-01-05 DIAGNOSIS — R0600 Dyspnea, unspecified: Secondary | ICD-10-CM | POA: Diagnosis not present

## 2021-01-05 MED ORDER — BREO ELLIPTA 200-25 MCG/INH IN AEPB: 1.0000 | INHALATION_SPRAY | Freq: Every day | RESPIRATORY_TRACT | 0 refills | Status: DC

## 2021-01-05 MED ORDER — FLUTICASONE-SALMETEROL 250-50 MCG/DOSE IN AEPB: 1.0000 | INHALATION_SPRAY | Freq: Two times a day (BID) | RESPIRATORY_TRACT | 5 refills | Status: DC

## 2021-01-05 NOTE — Patient Instructions (Addendum)
Start breo ellipta sampe inhaler 1 puff daily   Based on your insurance, Advair diskus is recommended in the same medicine category as Breo Ellipta - Take 1 puff twice daily of Avair diskus - rinse mouth out with water after (gargle and spit)  We will schedule you for pulmonary function testing  We will consider CT chest scan in the future  Continue flonase nasal spray daily  Please have ENT clinic send copy of their notes to our clinic

## 2021-01-05 NOTE — Progress Notes (Signed)
Synopsis: Referred in 12/2020 for dyspnea on exertion  Subjective:   PATIENT ID: Lance Anderson GENDER: male DOB: [birth year], MRN: [id number]   HPI  Chief Complaint  Patient presents with  . Consult    Referred by PCP for coughing, wheezing and DOE for the past 4 months. Completed a spirometry at PCP's office. States he has been losing his voice as well.    Lance Anderson is a 70 year old male, never smoker with obstructive sleep apnea and covid 19 infection 11/2019 who is referred to pulmonary clinic for ongoing cough, wheezing and dyspnea on exertion.   He started to have sinus congestion with drainage, cough with mucous production, wheezing and shortness of breath. He was treated with multiple rounds of antibiotics and prednisone with some improvement of his symptoms. He reports the cough and wheezing are improved along with his sinus congestion. He reports noticing wheezing in the evening time when he is watching TV. He continues to have exertional dyspnea for example he was more winded than expected when helping a neighbor move a chair, but he reports running a quarter mile with his grandson with no issues. He denies nighttime awakenings for cough or shortness of breath.   He had a PSG on 12/28/20 at [facility] with an AHI of 10/hour and has been started on CPAP therapy last week with a nasal pillow mask. He reports it is too early to notice any benefit.   He denies reflux or heartburn symptoms. He was evaluated by [facility] and trialed on PPI therapy for 1 month which he denies any change in his symptoms.   He was previously on symbicort 80-4.50mcg and did not notice a change in his symptoms.     He does report a 10-15lbs weight gain over the past year.  Past Medical History:  Diagnosis Date  . Abdominal aortic ectasia (HCC) 03/12/2018  . Allergy   . Cataract   . DDD (degenerative disc disease), lumbar   . Depression   . Hyperlipidemia   . Hypertension   . RBBB    . Renal insufficiency 02/25/2019  . Vasovagal syncope      Family History  Problem Relation Age of Onset  . Heart disease Father   . Lung cancer Father   . Heart attack Brother   . Heart attack Paternal Grandfather      Social History   Socioeconomic History  . Marital status: Married    Spouse name: Joyce Gross  . Number of children: 1  . Years of education: 42  . Highest education level: 12th grade  Occupational History  . Occupation: Curator    Comment: retired  Tobacco Use  . Smoking status: Never Smoker  . Smokeless tobacco: Never Used  Vaping Use  . Vaping Use: Never used  Substance and Sexual Activity  . Alcohol use: Yes    Comment: Occasionally, average 10 drinks/month, usually beer  . Drug use: No  . Sexual activity: Yes  Other Topics Concern  . 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 Codeine Other (See Comments)    Chest pain  . Paroxetine Other (See Comments)    Makes spacey  . Penicillins Diarrhea     Outpatient Medications Prior to Visit  Medication Sig Dispense Refill  . amLODipine (NORVASC) 10 MG tablet Take 1 tablet (10 mg total) by mouth daily. 90 tablet 1  . aspirin 81 MG EC tablet Take 1 tablet (81 mg total) by mouth daily. 30 tablet 11  . atorvastatin (LIPITOR) 40 MG tablet Take 1 tablet (40 mg total) by mouth at bedtime. 90 tablet 1  . FLUoxetine (PROZAC) 20 MG capsule Take 1 capsule (20 mg total) by mouth daily. 90 capsule 0  . fluticasone (FLONASE) 50 MCG/ACT nasal spray Place 2 sprays into both nostrils daily. Please give 90 day supply 9.9 mL  6  . naproxen (NAPROSYN) 500 MG tablet Take 1 tablet (500 mg total) by mouth 2 (two) times daily as needed (pain). for pain 60 tablet 0  . albuterol (VENTOLIN HFA) 108 (90 Base) MCG/ACT inhaler Inhale 2 puffs into the lungs every 6 (six) hours as needed for wheezing. 1 each 0  . budesonide-formoterol (SYMBICORT) 80-4.5 MCG/ACT inhaler Inhale 2 puffs into the lungs 2 (two) times daily. 1 each 1   No facility-administered medications prior to visit.    Review of Systems  Constitutional: Positive for malaise/fatigue. Negative for chills, fever and weight loss.  HENT: Negative for congestion, sinus pain and sore throat.   Respiratory: Positive for shortness of breath and wheezing. Negative for cough, hemoptysis and sputum production.   Cardiovascular: Negative for chest pain, palpitations, orthopnea, claudication, leg swelling and PND.  Gastrointestinal: Negative for abdominal pain, heartburn, nausea and vomiting.  Genitourinary: Negative.   Musculoskeletal: Negative for joint pain and myalgias.  Skin: Negative for rash.  Neurological: Negative.   Endo/Heme/Allergies: Negative.   Psychiatric/Behavioral: Negative.    Objective:   Vitals:   01/05/21 0856  BP: 128/82  Pulse: (!) 58  Temp: 98.2 F (36.8 C)  TempSrc: Temporal  SpO2: 98%  Weight: 249 lb 6.4 oz (113.1 kg)  Height: 5\' 11"  (1.803 m)     Physical Exam Constitutional:      General: He is not in acute distress.    Appearance: He is obese. He is not ill-appearing.  HENT:     Head: Normocephalic and atraumatic.     Nose: Nose normal.     Mouth/Throat:     Mouth: Mucous membranes are moist.     Pharynx: Oropharynx is clear.  Eyes:     General: No scleral icterus.    Conjunctiva/sclera: Conjunctivae normal.     Pupils: Pupils are equal, round, and reactive to light.  Cardiovascular:     Rate and Rhythm: Normal rate and regular rhythm.     Pulses: Normal pulses.     Heart sounds: Normal heart sounds. No murmur  heard.   Pulmonary:     Effort: Pulmonary effort is normal.     Comments: Scattered inspiratory squeeks at right mid and lower lung fields Abdominal:     General: Bowel sounds are normal.     Palpations: Abdomen is soft.  Musculoskeletal:     Right lower leg: No edema.     Left lower leg: No edema.  Lymphadenopathy:     Cervical: No cervical adenopathy.  Skin:    General: Skin is warm and dry.     Capillary Refill: Capillary refill takes less than 2 seconds.  Neurological:     General: No focal deficit present.     Mental Status: He is alert.  Psychiatric:        Mood and Affect: Mood normal.        Behavior: Behavior normal.        Thought Content: Thought content normal.        Judgment: Judgment normal.    CBC    Component Value Date/Time   WBC 9.1 11/10/2020 1055   RBC 4.52 11/10/2020 1055   HGB 14.3 11/10/2020 1055   HCT 42.4 11/10/2020 1055   PLT 243 11/10/2020 1055   MCV 93.8 11/10/2020 1055   MCH 31.6 11/10/2020 1055   MCHC 33.7 11/10/2020 1055   RDW 12.5 11/10/2020 1055   BMP Latest Ref Rng & Units 11/10/2020 04/17/2020 02/24/2019  Glucose 65 - 139 mg/dL 96 97 95  BUN 7 - 25 mg/dL 20 29(N) 24  Creatinine 0.70 - 1.25 mg/dL 9.89 2.11 9.41  BUN/Creat Ratio 6 - 22 (calc) NOT APPLICABLE 30(H) NOT APPLICABLE  Sodium 135 - 146 mmol/L 139 140 141  Potassium 3.5 - 5.3 mmol/L 4.7 4.5 4.4  Chloride 98 - 110 mmol/L 105 107 108  CO2 20 - 32 mmol/L 27 26 27   Calcium 8.6 - 10.3 mg/dL 8.8 8.9 9.0   Chest imaging: CXR 09/06/20 No edema or airspace opacity. Heart size and pulmonary vascularity are normal. No adenopathy. There are old healed rib fractures on the Right.  PFT: No flowsheet data found.  Echo: Stress Echo 03/2016 No inducible ischemia or wall motion abnormalities EF 60-65% Unable to determine diastolic dysfunction RV systolic pressure normal  PSG 04/2016 AHI of 10.7 - mild OSA Mild to moderate snoring in all positions CPAP titration to 13cmH20  using ResMed AirFitP10(S) nasal pillow mask    Assessment & Plan:   Dyspnea, unspecified type - Plan: Pulmonary Function Test  OSA on CPAP  Discussion: Lance Anderson is a 70 year old male, never smoker with obstructive sleep apnea and covid 19 infection 11/2019 who is referred to pulmonary clinic for ongoing cough, wheezing and dyspnea on exertion.   He appears to have developed reactive airways disease after his Covid 19 infection in 11/2019 that was exacerbated by a sinus infection in 07/2020 with on going symptoms. The inspiratory squeeks on exam are concerning for possible bronchiolitis related to reactive airways disease vs inflammatory lung disease vs infection. He was treated with symbicort 80-4.60mcg which may not have been a strong enough inhaler for him to notice improvement.   We have provided him with breo ellipta 200-122mcg sample inhaler to start and sent in prescription for advair diskus 250-65mcg based on his insurance to be continued on.  We will check pulmonary function tests first. If these are concerning for bronchiolitis pattern then we will move forward with HRCT chest. We will monitor for symptom improvement with inhaler therapy.   We discussed the role of GERD in reactive airways disease. We have recommended that he try raising the head of his bed with blocks to reduce nighttime reflux if occurring and contributing to symptoms.   [facility] is [facility] at [facility] for his OSA.   Follow up in 2 months  [facility], MD [place] [facility] Office: [phone]   See Amion for Pager Details    Current Outpatient Medications:  .  amLODipine (NORVASC) 10 MG tablet, Take 1 tablet (10 mg total) by mouth daily., Disp: 90 tablet, Rfl: 1 .  aspirin  81 MG EC tablet, Take 1 tablet (81 mg total) by mouth daily., Disp: 30 tablet, Rfl: 11 .  atorvastatin (LIPITOR) 40 MG tablet, Take 1 tablet (40 mg total) by mouth at bedtime., Disp: 90 tablet, Rfl: 1 .   FLUoxetine (PROZAC) 20 MG capsule, Take 1 capsule (20 mg total) by mouth daily., Disp: 90 capsule, Rfl: 0 .  fluticasone (FLONASE) 50 MCG/ACT nasal spray, Place 2 sprays into both nostrils daily. Please give 90 day supply, Disp: 9.9 mL, Rfl: 6 .  fluticasone furoate-vilanterol (BREO ELLIPTA) 200-25 MCG/INH AEPB, Inhale 1 puff into the lungs daily., Disp: 28 each, Rfl: 0 .  Fluticasone-Salmeterol (ADVAIR DISKUS) 250-50 MCG/DOSE AEPB, Inhale 1 puff into the lungs in the morning and at bedtime., Disp: 60 each, Rfl: 5 .  naproxen (NAPROSYN) 500 MG tablet, Take 1 tablet (500 mg total) by mouth 2 (two) times daily as needed (pain). for pain, Disp: 60 tablet, Rfl: 0

## 2021-01-11 DIAGNOSIS — J309 Allergic rhinitis, unspecified: Secondary | ICD-10-CM | POA: Diagnosis not present

## 2021-01-11 DIAGNOSIS — R490 Dysphonia: Secondary | ICD-10-CM | POA: Diagnosis not present

## 2021-01-17 ENCOUNTER — Other Ambulatory Visit: Payer: Self-pay | Admitting: Medical-Surgical

## 2021-01-17 DIAGNOSIS — M5137 Other intervertebral disc degeneration, lumbosacral region: Secondary | ICD-10-CM

## 2021-02-07 ENCOUNTER — Ambulatory Visit (INDEPENDENT_AMBULATORY_CARE_PROVIDER_SITE_OTHER): Payer: Medicare Other | Admitting: Medical-Surgical

## 2021-02-07 DIAGNOSIS — Z0000 Encounter for general adult medical examination without abnormal findings: Secondary | ICD-10-CM

## 2021-02-07 NOTE — Progress Notes (Signed)
MEDICARE ANNUAL WELLNESS VISIT  02/07/2021  Telephone Visit Disclaimer This Medicare AWV was conducted by telephone due to national recommendations for restrictions regarding the COVID-19 Pandemic (e.g. social distancing).  I verified, using two identifiers, that I am speaking with Lance Anderson or their authorized healthcare agent. I discussed the limitations, risks, security, and privacy concerns of performing an evaluation and management service by telephone and the potential availability of an in-person appointment in the future. The patient expressed understanding and agreed to proceed.  Location of Patient: Home Location of Provider (nurse):  In the office.  Subjective:    Lance Anderson is a 70 y.o. male patient of Christen ButterJessup, Joy, NP who had a Medicare Annual Wellness Visit today via telephone. Lance Anderson is Working part time and lives with their spouse. he has 1 child. he reports that he is socially active and does interact with friends/family regularly. he is minimally physically active and enjoys playing with his grandchildren.  Patient Care Team: Christen ButterJessup, Joy, NP as PCP - General (Nurse Practitioner)  Advanced Directives 02/07/2021 09/20/2019 11/04/2018 07/29/2018  Does Patient Have a Medical Advance Directive? No No No No  Would patient like information on creating a medical advance directive? No - Patient declined No - Patient declined No - Patient declined No - Patient declined    Hospital Utilization Over the Past 12 Months: # of hospitalizations or ER visits: 0 # of surgeries: 0  Review of Systems    Patient reports that his overall health is unchanged compared to last year.  History obtained from chart review and the patient  Patient Reported Readings (BP, Pulse, CBG, Weight, etc) none  Pain Assessment Pain : No/denies pain     Current Medications & Allergies (verified) Allergies as of 02/07/2021      Reactions   Codeine Other (See Comments)   Chest pain   Paroxetine  Other (See Comments)   Makes spacey   Penicillins Diarrhea      Medication List       Accurate as of February 07, 2021  9:30 AM. If you have any questions, ask your nurse or doctor.        amLODipine 10 MG tablet Commonly known as: NORVASC Take 1 tablet (10 mg total) by mouth daily.   aspirin 81 MG EC tablet Take 1 tablet (81 mg total) by mouth daily.   atorvastatin 40 MG tablet Commonly known as: LIPITOR Take 1 tablet (40 mg total) by mouth at bedtime.   Breo Ellipta 200-25 MCG/INH Aepb Generic drug: fluticasone furoate-vilanterol Inhale 1 puff into the lungs daily.   EQ [employer] features editorChamber Anti-Static Devi   FLUoxetine 20 MG capsule Commonly known as: PROZAC Take 1 capsule (20 mg total) by mouth daily.   fluticasone 50 MCG/ACT nasal spray Commonly known as: FLONASE Place 2 sprays into both nostrils daily. Please give 90 day supply   Fluticasone-Salmeterol 250-50 MCG/DOSE Aepb Commonly known as: Advair Diskus Inhale 1 puff into the lungs in the morning and at bedtime.   Misc. Devices Misc Start CPAP at 13 cm. water pressure with EPR 1.  CPAP machine with a mask and supplies for OSA with AHI 10.  AirFit P10(S) nasal pillow mask or patient preference.  Send to a local DME.  Luna II or first available   naproxen 500 MG tablet Commonly known as: NAPROSYN TAKE 1 TABLET TWICE DAILY AS NEEDED FOR PAIN       History (reviewed): Past Medical History:  Diagnosis Date  . Abdominal aortic  ectasia (HCC) 03/12/2018  . Allergy   . Cataract   . DDD (degenerative disc disease), lumbar   . Depression   . Hyperlipidemia   . Hypertension   . RBBB   . Renal insufficiency 02/25/2019  . Vasovagal syncope    Past Surgical History:  Procedure Laterality Date  . CATARACT EXTRACTION, BILATERAL    . KNEE ARTHROSCOPY W/ MENISCAL REPAIR Left   . ROTATOR CUFF REPAIR Right   . WISDOM TOOTH EXTRACTION     Family History  Problem Relation Age of Onset  . Heart disease Father   . Lung  cancer Father   . Heart attack Brother   . Heart attack Paternal Grandfather    Social History   Socioeconomic History  . Marital status: Married    Spouse name: Joyce Gross  . Number of children: 1  . Years of education: 54  . Highest education level: 12th grade  Occupational History  . Occupation: Curator    Comment: retired  Tobacco Use  . Smoking status: Never Smoker  . Smokeless tobacco: Never Used  Vaping Use  . Vaping Use: Never used  Substance and Sexual Activity  . Alcohol use: Yes    Comment: Occasionally, average 10 drinks/month, usually beer  . Drug use: No  . Sexual activity: Yes  Other Topics Concern  . 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    Social Determinants of Health   Financial Resource Strain: Low Risk   . Difficulty of Paying Living Expenses: Not hard at all  Food Insecurity: No Food Insecurity  . Worried About Programme researcher, broadcasting/film/video in the Last Year: Never true  . Ran Out of Food in the Last Year: Never true  Transportation Needs: No Transportation Needs  . Lack of Transportation (Medical): No  . Lack of Transportation (Non-Medical): No  Physical Activity: Insufficiently Active  . Days of Exercise per Week: 4 days  . Minutes of Exercise per Session: 20 min  Stress: No Stress Concern Present  . Feeling of Stress : Not at all  Social Connections: Moderately Integrated  . Frequency of Communication with Friends and Family: More than three times a week  . Frequency of Social Gatherings with Friends and Family: More than three times a week  . Attends Religious Services: More than 4 times per year  . Active Member of Clubs or Organizations: No  . Attends Banker Meetings: Never  . Marital Status: Married    Activities of Daily Living In your present state of health, do you have any difficulty performing the following activities: 02/07/2021   Hearing? N  Vision? Y  Comment sometimes at night  Difficulty concentrating or making decisions? N  Walking or climbing stairs? Y  Comment at times, due to a bad knee  Dressing or bathing? N  Doing errands, shopping? N  Preparing Food and eating ? N  Using the Toilet? N  In the past six months, have you accidently leaked urine? N  Do you have problems with loss of bowel control? N  Managing your Medications? N  Managing your Finances? N  Housekeeping or managing your Housekeeping? N  Some recent data might be hidden    Patient Education/ Literacy How often do you need to have someone help you when you read instructions, pamphlets, or other written materials from your doctor or  pharmacy?: 1 - Never What is the last grade level you completed in school?: 12th grade  Exercise Current Exercise Habits: Home exercise routine, Type of exercise: walking, Time (Minutes): 20, Frequency (Times/Week): 4, Weekly Exercise (Minutes/Week): 80, Exercise limited by: orthopedic condition(s);respiratory conditions(s)  Diet Patient reports consuming 3 meals a day and 2 snack(s) a day Patient reports that his primary diet is: Regular Patient reports that he does have regular access to food.   Depression Screen PHQ 2/9 Scores 02/07/2021 04/17/2020 09/20/2019 09/02/2019 02/24/2019 05/29/2018 05/29/2018  PHQ - 2 Score 0 0 0 0 0 0 0  PHQ- 9 Score - 2 0 0 2 3 3      Fall Risk Fall Risk  02/07/2021 04/17/2020 09/20/2019 02/24/2019 05/29/2018  Falls in the past year? 0 0 0 1 Yes  Number falls in past yr: 0 - 0 1 2 or more  Injury with Fall? 1 - 0 0 Yes  Risk Factor Category  - - - - High Fall Risk  Risk for fall due to : No Fall Risks - - History of fall(s) History of fall(s)  Follow up Falls evaluation completed Falls evaluation completed Falls prevention discussed - Falls evaluation completed;Education provided     Objective:  Lance Anderson seemed alert and oriented and he participated appropriately during our  telephone visit.  Blood Pressure Weight BMI  BP Readings from Last 3 Encounters:  01/05/21 128/82  11/22/20 (!) 158/75  11/10/20 137/82   Wt Readings from Last 3 Encounters:  01/05/21 249 lb 6.4 oz (113.1 kg)  11/22/20 249 lb (112.9 kg)  11/10/20 249 lb 4.8 oz (113.1 kg)   BMI Readings from Last 1 Encounters:  01/05/21 34.78 kg/m    *Unable to obtain current vital signs, weight, and BMI due to telephone visit type  Hearing/Vision  . Kato did not seem to have difficulty with hearing/understanding during the telephone conversation . Reports that he has had a formal eye exam by an eye care professional within the past year . Reports that he has had a formal hearing evaluation within the past year *Unable to fully assess hearing and vision during telephone visit type  Cognitive Function: 6CIT Screen 02/07/2021 09/20/2019 05/29/2018  What Year? 0 points 0 points 0 points  What month? 0 points 0 points 0 points  What time? 0 points 0 points 0 points  Count back from 20 0 points 0 points 0 points  Months in reverse 0 points 0 points 0 points  Repeat phrase 0 points 2 points 0 points  Total Score 0 2 0   (Normal:0-7, Significant for Dysfunction: >8)  Normal Cognitive Function Screening: Yes   Immunization & Health Maintenance Record Immunization History  Administered Date(s) Administered  . Pneumococcal Conjugate-13 10/20/2020  . Td 10/09/2002  . Tdap 12/30/2012  . Zoster 08/10/2013    Health Maintenance  Topic Date Due  . COVID-19 Vaccine (1) 02/23/2021 (Originally 01/16/1956)  . INFLUENZA VACCINE  03/22/2021 (Originally 07/23/2020)  . PNA vac Low Risk Adult (2 of 2 - PPSV23) 10/20/2021  . TETANUS/TDAP  12/30/2022  . COLONOSCOPY (Pts 45-81yrs Insurance coverage will need to be confirmed)  06/04/2024  . Hepatitis C Screening  Completed       Assessment  This is a routine wellness examination for 06/06/2024.  Health Maintenance: Due or Overdue There are no  preventive care reminders to display for this patient.  Lance Anderson does not need a referral for Community Assistance: Care Management:   no Social  Work:    no Prescription Assistance:  no Nutrition/Diabetes Education:  no   Plan:  Personalized Goals Goals Addressed              This Visit's Progress   .  Patient Stated (pt-stated)        02/07/2021 AWV Goal: Improved Nutrition/Diet  . Patient will verbalize understanding that diet plays an important role in overall health and that a poor diet is a risk factor for many chronic medical conditions.  . Over the next year, patient will improve self management of their diet by incorporating better food choices. . Patient will utilize available community resources to help with food acquisition if needed (ex: food pantries, [address], etc) . Patient will work with nutrition specialist if a referral was made       Personalized Health Maintenance & Screening Recommendations  Covid vaccine, Shingrix vaccine   Lung Cancer Screening Recommended: no (Low Dose CT Chest recommended if Age 57-80 years, 30 pack-year currently smoking OR have quit w/in past 15 years) Hepatitis C Screening recommended: no HIV Screening recommended: yes  Advanced Directives: Written information was not prepared per patient's request.  Referrals & Orders No orders of the defined types were placed in this encounter.   Follow-up Plan . Follow-up with Christen Butter, NP as planned . Schedule your appointment at your pharmacy for your shingrix vaccine.   I have personally reviewed and noted the following in the patient's chart:   . Medical and social history . Use of alcohol, tobacco or illicit drugs  . Current medications and supplements . Functional ability and status . Nutritional status . Physical activity . Advanced directives . List of other physicians . Hospitalizations, surgeries, and ER visits in previous 12 months . Vitals . Screenings to  include cognitive, depression, and falls . Referrals and appointments  In addition, I have reviewed and discussed with Lance Gottron certain preventive protocols, quality metrics, and best practice recommendations. A written personalized care plan for preventive services as well as general preventive health recommendations is available and can be mailed to the patient at his request.      Modesto Charon, RN  02/07/2021

## 2021-02-07 NOTE — Patient Instructions (Addendum)
Health Maintenance, Male Adopting a healthy lifestyle and getting preventive care are important in promoting health and wellness. Ask your health care provider about:  The right schedule for you to have regular tests and exams.  Things you can do on your own to prevent diseases and keep yourself healthy. What should I know about diet, weight, and exercise? Eat a healthy diet  Eat a diet that includes plenty of vegetables, fruits, low-fat dairy products, and lean protein.  Do not eat a lot of foods that are high in solid fats, added sugars, or sodium.   Maintain a healthy weight Body mass index (BMI) is a measurement that can be used to identify possible weight problems. It estimates body fat based on height and weight. Your health care provider can help determine your BMI and help you achieve or maintain a healthy weight. Get regular exercise Get regular exercise. This is one of the most important things you can do for your health. Most adults should:  Exercise for at least 150 minutes each week. The exercise should increase your heart rate and make you sweat (moderate-intensity exercise).  Do strengthening exercises at least twice a week. This is in addition to the moderate-intensity exercise.  Spend less time sitting. Even light physical activity can be beneficial. Watch cholesterol and blood lipids Have your blood tested for lipids and cholesterol at 68 years of age, then have this test every 5 years. You may need to have your cholesterol levels checked more often if:  Your lipid or cholesterol levels are high.  You are older than 68 years of age.  You are at high risk for heart disease. What should I know about cancer screening? Many types of cancers can be detected early and may often be prevented. Depending on your health history and family history, you may need to have cancer screening at various ages. This may include screening for:  Colorectal cancer.  Prostate  cancer.  Skin cancer.  Lung cancer. What should I know about heart disease, diabetes, and high blood pressure? Blood pressure and heart disease  High blood pressure causes heart disease and increases the risk of stroke. This is more likely to develop in people who have high blood pressure readings, are of African descent, or are overweight.  Talk with your health care provider about your target blood pressure readings.  Have your blood pressure checked: ? Every 3-5 years if you are 55-22 years of age. ? Every year if you are 41 years old or older.  If you are between the ages of 16 and 56 and are a current or former smoker, ask your health care provider if you should have a one-time screening for abdominal aortic aneurysm (AAA). Diabetes Have regular diabetes screenings. This checks your fasting blood sugar level. Have the screening done:  Once every three years after age 70 if you are at a normal weight and have a low risk for diabetes.  More often and at a younger age if you are overweight or have a high risk for diabetes. What should I know about preventing infection? Hepatitis B If you have a higher risk for hepatitis B, you should be screened for this virus. Talk with your health care provider to find out if you are at risk for hepatitis B infection. Hepatitis C Blood testing is recommended for:  Everyone born from [birth year] through 1963.  Anyone with known risk factors for hepatitis C. Sexually transmitted infections (STIs)  You should be screened  each year for STIs, including gonorrhea and chlamydia, if: ? You are sexually active and are younger than 68 years of age. ? You are older than 68 years of age and your health care provider tells you that you are at risk for this type of infection. ? Your sexual activity has changed since you were last screened, and you are at increased risk for chlamydia or gonorrhea. Ask your health care provider if you are at risk.  Ask your  health care provider about whether you are at high risk for HIV. Your health care provider may recommend a prescription medicine to help prevent HIV infection. If you choose to take medicine to prevent HIV, you should first get tested for HIV. You should then be tested every 3 months for as long as you are taking the medicine. Follow these instructions at home: Lifestyle  Do not use any products that contain nicotine or tobacco, such as cigarettes, e-cigarettes, and chewing tobacco. If you need help quitting, ask your health care provider.  Do not use street drugs.  Do not share needles.  Ask your health care provider for help if you need support or information about quitting drugs. Alcohol use  Do not drink alcohol if your health care provider tells you not to drink.  If you drink alcohol: ? Limit how much you have to 0-2 drinks a day. ? Be aware of how much alcohol is in your drink. In the [place] one drink equals one 12 oz bottle of beer (355 mL), one 5 oz glass of wine (148 mL), or one 1 oz glass of hard liquor (44 mL). General instructions  Schedule regular health, dental, and eye exams.  Stay current with your vaccines.  Tell your health care provider if: ? You often feel depressed. ? You have ever been abused or do not feel safe at home. Summary  Adopting a healthy lifestyle and getting preventive care are important in promoting health and wellness.  Follow your health care provider's instructions about healthy diet, exercising, and getting tested or screened for diseases.  Follow your health care provider's instructions on monitoring your cholesterol and blood pressure. This information is not intended to replace advice given to you by your health care provider. Make sure you discuss any questions you have with your health care provider. Document Revised: 12/02/2018 Document Reviewed: 12/02/2018 Elsevier Patient Education  2021 Elsevier Inc.    MEDICARE [place]  VISIT Health Maintenance Summary and Written Plan of Care  Mr. Mccanless ,  Thank you for allowing me to perform your Medicare Annual Wellness Visit and for your ongoing commitment to your health.   Health Maintenance & Immunization History Health Maintenance  Topic Date Due  . COVID-19 Vaccine (1) 02/23/2021 (Originally 01/16/1956)  . INFLUENZA VACCINE  03/22/2021 (Originally 07/23/2020)  . PNA vac Low Risk Adult (2 of 2 - PPSV23) 10/20/2021  . TETANUS/TDAP  12/30/2022  . COLONOSCOPY (Pts 45-29yrs Insurance coverage will need to be confirmed)  06/04/2024  . Hepatitis C Screening  Completed   Immunization History  Administered Date(s) Administered  . Pneumococcal Conjugate-13 10/20/2020  . Td 10/09/2002  . Tdap 12/30/2012  . Zoster 08/10/2013    These are the patient goals that we discussed: Goals Addressed              This Visit's Progress   .  Patient Stated (pt-stated)        02/07/2021 AWV Goal: Improved Nutrition/Diet  . Patient will verbalize understanding  that diet plays an important role in overall health and that a poor diet is a risk factor for many chronic medical conditions.  . Over the next year, patient will improve self management of their diet by incorporating better food choices. . Patient will utilize available community resources to help with food acquisition if needed (ex: food pantries, [address], etc) . Patient will work with nutrition specialist if a referral was made         This is a list of Health Maintenance Items that are overdue or due now: Covid vaccine, Shingrix vaccine   Orders/Referrals Placed Today: No orders of the defined types were placed in this encounter.  Follow-up Plan . Follow-up with Christen Butter, NP as planned . Schedule your appointment at your pharmacy for your shingrix vaccine.

## 2021-02-15 ENCOUNTER — Other Ambulatory Visit: Payer: Self-pay

## 2021-02-15 DIAGNOSIS — M5137 Other intervertebral disc degeneration, lumbosacral region: Secondary | ICD-10-CM

## 2021-02-15 MED ORDER — NAPROXEN 500 MG PO TABS: 500.0000 mg | ORAL_TABLET | Freq: Two times a day (BID) | ORAL | 0 refills | Status: DC | PRN

## 2021-02-21 DIAGNOSIS — R490 Dysphonia: Secondary | ICD-10-CM | POA: Diagnosis not present

## 2021-02-21 DIAGNOSIS — Z961 Presence of intraocular lens: Secondary | ICD-10-CM | POA: Diagnosis not present

## 2021-02-24 ENCOUNTER — Emergency Department
Admission: EM | Admit: 2021-02-24 | Discharge: 2021-02-24 | Disposition: A | Discharge status: Home / Self Care | Payer: Worker's Compensation | Attending: Emergency Medicine | Admitting: Emergency Medicine

## 2021-02-24 ENCOUNTER — Emergency Department: Payer: Worker's Compensation

## 2021-02-24 ENCOUNTER — Encounter: Payer: Self-pay | Admitting: Emergency Medicine

## 2021-02-24 ENCOUNTER — Other Ambulatory Visit: Payer: Self-pay

## 2021-02-24 DIAGNOSIS — S4992XA Unspecified injury of left shoulder and upper arm, initial encounter: Secondary | ICD-10-CM | POA: Diagnosis present

## 2021-02-24 DIAGNOSIS — I959 Hypotension, unspecified: Secondary | ICD-10-CM | POA: Diagnosis not present

## 2021-02-24 DIAGNOSIS — Z79899 Other long term (current) drug therapy: Secondary | ICD-10-CM | POA: Insufficient documentation

## 2021-02-24 DIAGNOSIS — Z8616 Personal history of COVID-19: Secondary | ICD-10-CM | POA: Diagnosis not present

## 2021-02-24 DIAGNOSIS — I10 Essential (primary) hypertension: Secondary | ICD-10-CM | POA: Diagnosis not present

## 2021-02-24 DIAGNOSIS — S4982XA Other specified injuries of left shoulder and upper arm, initial encounter: Secondary | ICD-10-CM | POA: Diagnosis not present

## 2021-02-24 DIAGNOSIS — S0990XA Unspecified injury of head, initial encounter: Secondary | ICD-10-CM | POA: Diagnosis not present

## 2021-02-24 DIAGNOSIS — Z7982 Long term (current) use of aspirin: Secondary | ICD-10-CM | POA: Insufficient documentation

## 2021-02-24 DIAGNOSIS — S40012A Contusion of left shoulder, initial encounter: Secondary | ICD-10-CM | POA: Insufficient documentation

## 2021-02-24 DIAGNOSIS — M25512 Pain in left shoulder: Secondary | ICD-10-CM | POA: Diagnosis not present

## 2021-02-24 DIAGNOSIS — R0902 Hypoxemia: Secondary | ICD-10-CM | POA: Diagnosis not present

## 2021-02-24 DIAGNOSIS — R001 Bradycardia, unspecified: Secondary | ICD-10-CM | POA: Diagnosis not present

## 2021-02-24 DIAGNOSIS — W1789XA Other fall from one level to another, initial encounter: Secondary | ICD-10-CM | POA: Insufficient documentation

## 2021-02-24 MED ORDER — OXYCODONE-ACETAMINOPHEN 5-325 MG PO TABS
1.0000 | ORAL_TABLET | Freq: Once | ORAL | Status: AC
Start: 2021-02-24 — End: 2021-02-24
  Administered 2021-02-24: 1 via ORAL
  Filled 2021-02-24: qty 1

## 2021-02-24 MED ORDER — HYDROCODONE-ACETAMINOPHEN 5-325 MG PO TABS: 1.0000 | ORAL_TABLET | ORAL | 0 refills | Status: AC | PRN

## 2021-02-24 NOTE — ED Provider Notes (Signed)
[facility] Department Provider Note ____________________________________________   Event Date/Time   First MD Initiated Contact with Patient 02/24/21 1053     (approximate)  I have reviewed the triage vital signs and the nursing notes.   HISTORY  Chief Complaint Fall    HPI Oda Botting is a 70 y.o. male with PMH as noted below who presents with a left shoulder and head injury after a fall.  The patient states that he was on a flatbed truck and fell backwards off of it.  He hit his head but did not lose consciousness.  He denies any vomiting or severe headache.  He also fell onto the left shoulder with a direct blow.  He states he has pain on any movement of the left shoulder but denies any numbness or weakness.  He has no neck or back pain and denies other injuries.  He is on a daily aspirin.  Past Medical History:  Diagnosis Date  . Abdominal aortic ectasia (HCC) 03/12/2018  . Allergy   . Cataract   . DDD (degenerative disc disease), lumbar   . Depression   . Hyperlipidemia   . Hypertension   . RBBB   . Renal insufficiency 02/25/2019  . Vasovagal syncope     Patient Active Problem List   Diagnosis Date Noted  . COVID-19 virus infection 12/20/2019  . Renal insufficiency 02/25/2019  . 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 Cervical radiculopathy 10/30/2018  . Right bundle branch block (RBBB) 05/29/2018  . Sinus bradycardia 05/29/2018  . Peripheral edema 04/27/2018  . Venous stasis dermatitis of right lower extremity 04/27/2018  . Strain of calf muscle 04/27/2018  . Abdominal aortic ectasia (HCC) 03/12/2018  . 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 Recurrent major depressive disorder, in full remission (HCC) 02/25/2018  . Encounter for long-term (current) use of medications 02/25/2018  . Encounter for abdominal aortic aneurysm (AAA) screening 02/25/2018  . Dyslipidemia, goal LDL below 100 02/25/2018   . Refused influenza vaccine 02/25/2018  . Refused pneumococcal vaccination 02/25/2018  . Pruritus 08/04/2017  . Encounter for monitoring statin therapy 08/04/2017  . Depression 05/21/2017  . Family history of heart disease in male family member before age 30 05/21/2017  . Tear of meniscus of knee 05/21/2017  . Class 1 obesity due to excess calories with serious comorbidity in adult 05/21/2017  . Allergic rhinitis 05/21/2017  . DDD (degenerative disc disease), lumbosacral 05/21/2017  . Vasovagal episode 05/21/2017  . Essential hypertension with goal blood pressure less than 140/90 08/17/2015  . Pseudophakia of both eyes 10/20/2013  . Dyslipidemia 08/10/2013    Past Surgical History:  Procedure Laterality Date  . CATARACT EXTRACTION, BILATERAL    . KNEE ARTHROSCOPY W/ MENISCAL REPAIR Left   . ROTATOR CUFF REPAIR Right   . WISDOM TOOTH EXTRACTION      Prior to Admission medications   Medication Sig Start Date End Date Taking? Authorizing Provider  HYDROcodone-acetaminophen (NORCO/VICODIN) 5-325 MG tablet Take 1-2 tablets by mouth every 4 (four) hours as needed for up to 5 days for moderate pain. 02/24/21 03/01/21 Yes Dionne Bucy, MD  amLODipine (NORVASC) 10 MG tablet Take 1 tablet (10 mg total) by mouth daily. 12/07/20   Christen Butter, NP  aspirin 81 MG EC tablet Take 1 tablet (81 mg total) by mouth daily. 02/25/18   Carlis Stable, PA-C  atorvastatin (LIPITOR) 40 MG tablet Take 1 tablet (40 mg total) by mouth at bedtime. 12/07/20   Jessup,  Joy, NP  FLUoxetine (PROZAC) 20 MG capsule Take 1 capsule (20 mg total) by mouth daily. 11/22/20   Christen Butter, NP  fluticasone (FLONASE) 50 MCG/ACT nasal spray Place 2 sprays into both nostrils daily. Please give 90 day supply 12/07/20   Christen Butter, NP  fluticasone furoate-vilanterol (BREO ELLIPTA) 200-25 MCG/INH AEPB Inhale 1 puff into the lungs daily. Patient not taking: Reported on 02/07/2021 01/05/21   Martina Sinner, MD   Fluticasone-Salmeterol (ADVAIR DISKUS) 250-50 MCG/DOSE AEPB Inhale 1 puff into the lungs in the morning and at bedtime. 01/05/21   Martina Sinner, MD  Misc. Devices MISC Start CPAP at 13 cm. water pressure with EPR 1.  CPAP machine with a mask and supplies for OSA with AHI 10.  AirFit P10(S) nasal pillow mask or patient preference.  Send to a local DME.  Luna II or first available 01/02/21   [provider]  naproxen (NAPROSYN) 500 MG tablet Take 1 tablet (500 mg total) by mouth 2 (two) times daily as needed (for pain). 02/15/21   Christen Butter, NP  Spacer/Aero-Holding [facility] CHAMBER ANTI-STATIC) DEVI  11/15/20   [provider]    Allergies Codeine, Paroxetine, and Penicillins  Family History  Problem Relation Age of Onset  . Heart disease Father   . Lung cancer Father   . Heart attack Brother   . Heart attack Paternal Grandfather     Social History Social History   Tobacco Use  . Smoking status: Never Smoker  . Smokeless tobacco: Never Used  Vaping Use  . Vaping Use: Never used  Substance Use Topics  . Alcohol use: Yes    Comment: Occasionally, average 10 drinks/month, usually beer  . Drug use: No    Review of Systems  Constitutional: No fever. Eyes: No eye pain. ENT: No neck pain. Cardiovascular: Denies chest pain. Respiratory: Denies shortness of breath. Gastrointestinal: No abdominal pain. Genitourinary: Negative for flank pain. Musculoskeletal: Negative for back pain.  Positive for left shoulder pain. Skin: Negative for rash. Neurological: Negative for headaches, focal weakness or numbness.   ____________________________________________   PHYSICAL EXAM:  VITAL SIGNS: ED Triage Vitals  Enc Vitals Group     BP 02/24/21 1049 (!) 116/59     Pulse Rate 02/24/21 1049 62     Resp 02/24/21 1049 (!) 21     Temp 02/24/21 1046 97.8 F (36.6 C)     Temp Source 02/24/21 1046 Oral     SpO2 02/24/21 1049 96 %     Weight 02/24/21 1047  245 lb (111.1 kg)     Height 02/24/21 1047 5\' 11"  (1.803 m)     Head Circumference --      Peak Flow --      Pain Score 02/24/21 1046 3     Pain Loc --      Pain Edu? --      Excl. in GC? --     Constitutional: Alert and oriented. Well appearing and in no acute distress. Eyes: Conjunctivae are normal.  EOMI.  PERRLA. Head: Atraumatic. Nose: No congestion/rhinnorhea. Mouth/Throat: Mucous membranes are moist.   Neck: Normal range of motion.  No midline cervical spinal tenderness. Cardiovascular: Normal rate, regular rhythm. Good peripheral circulation. Respiratory: Normal respiratory effort.  No retractions. Gastrointestinal: No distention.  Musculoskeletal: No lower extremity edema.  Extremities warm and well perfused.  Pain on range of motion of left shoulder.  No visible deformity.  2+ radial pulse.  Full range of motion  at elbow and wrist. Neurologic:  Normal speech and language. Motor and sensory intact in all extremities. Skin:  Skin is warm and dry. No rash noted. Psychiatric: Mood and affect are normal. Speech and behavior are normal.  ____________________________________________   LABS (all labs ordered are listed, but only abnormal results are displayed)  Labs Reviewed - No data to display ____________________________________________  EKG  ED ECG REPORT I, Dionne Bucy, the attending physician, personally viewed and interpreted this ECG.  Date: 02/24/2021 EKG Time: 1112 Rate: 55 Rhythm: normal sinus rhythm QRS Axis: normal Intervals: RBBB ST/T Wave abnormalities: normal Narrative Interpretation: no evidence of acute ischemia  ____________________________________________  RADIOLOGY  XR L shoulder: No acute fracture or dislocation CT head: No ICH or other acute abnormality  ____________________________________________   PROCEDURES  Procedure(s) performed: No  Procedures  Critical Care performed:  No ____________________________________________   INITIAL IMPRESSION / ASSESSMENT AND PLAN / ED COURSE  Pertinent labs & imaging results that were available during my care of the patient were reviewed by me and considered in my medical decision making (see chart for details).  70 year old male with PMH as noted above presents with head and left shoulder injuries after a mechanical fall off of a flat bed truck onto the ground.  He reports a direct blow to the left shoulder.  He did not have LOC.  On exam, the patient is well-appearing.  His vital signs are normal.  Neurologic exam is nonfocal.  He has pain on range of motion of the left shoulder but no deformity.  The left upper extremity is neuro/vascular intact.  Given the patient's age and since he is on aspirin we will obtain a CT head as well as x-rays of the left shoulder.  There is no evidence of any spinal injury.  ----------------------------------------- 1:32 PM on 02/24/2021 -----------------------------------------  CT and x-rays are negative.  Shoulder injury is consistent with a contusion.  The patient is stable for discharge home.  We will provide him with a sling for comfort and a prescription for pain medication.  I have given him orthopedic referral.  ____________________________________________   FINAL CLINICAL IMPRESSION(S) / ED DIAGNOSES  Final diagnoses:  Contusion of left shoulder, initial encounter  Minor head injury, initial encounter      NEW MEDICATIONS STARTED DURING THIS VISIT:  New Prescriptions   HYDROCODONE-ACETAMINOPHEN (NORCO/VICODIN) 5-325 MG TABLET    Take 1-2 tablets by mouth every 4 (four) hours as needed for up to 5 days for moderate pain.     Note:  This document was prepared using Dragon voice recognition software and may include unintentional dictation errors.    Dionne Bucy, MD 02/24/21 [phone]

## 2021-02-24 NOTE — Discharge Instructions (Signed)
Return to the ER for new, worsening, or persistent severe pain, weakness, numbness, or any other new or worsening symptoms that concern you.  Follow-up with an orthopedist within the next week.  You may wear the sling for comfort and arm support until you follow-up.

## 2021-02-24 NOTE — ED Triage Notes (Addendum)
Pt presents to the ED after a mechanical fall off his truck. Pt fell approx 19ft and fell on his L shoulder. Pt also hit the back of his head. Denies LOC. Denies blood thinners. Pt c/o L shoulder pain and has a small knot on the L side of the back of his head. Pt is A&Ox4 and NAD.   EMS gave of fluid.

## 2021-02-24 NOTE — ED Notes (Signed)
Pt with CT. 

## 2021-02-26 ENCOUNTER — Other Ambulatory Visit: Payer: Self-pay | Admitting: Medical-Surgical

## 2021-02-26 DIAGNOSIS — F3342 Major depressive disorder, recurrent, in full remission: Secondary | ICD-10-CM

## 2021-03-05 DIAGNOSIS — Z20822 Contact with and (suspected) exposure to covid-19: Secondary | ICD-10-CM | POA: Diagnosis not present

## 2021-03-08 ENCOUNTER — Other Ambulatory Visit: Payer: Self-pay

## 2021-03-08 ENCOUNTER — Ambulatory Visit (INDEPENDENT_AMBULATORY_CARE_PROVIDER_SITE_OTHER): Payer: Medicare Other | Admitting: Pulmonary Disease

## 2021-03-08 ENCOUNTER — Ambulatory Visit: Payer: Medicare Other | Admitting: Pulmonary Disease

## 2021-03-08 ENCOUNTER — Encounter: Payer: Self-pay | Admitting: Pulmonary Disease

## 2021-03-08 VITALS — BP 118/70 | HR 79 | Temp 97.9°F | Ht 71.0 in | Wt 248.0 lb

## 2021-03-08 DIAGNOSIS — R0600 Dyspnea, unspecified: Secondary | ICD-10-CM

## 2021-03-08 DIAGNOSIS — Z8616 Personal history of COVID-19: Secondary | ICD-10-CM | POA: Diagnosis not present

## 2021-03-08 LAB — PULMONARY FUNCTION TEST
DL/VA % pred: 125 %
DL/VA: 5.13 ml/min/mmHg/L
DLCO cor % pred: 119 %
DLCO cor: 31.3 ml/min/mmHg
DLCO unc % pred: 119 %
DLCO unc: 31.3 ml/min/mmHg
FEF 25-75 Post: 3.36 L/sec
FEF 25-75 Pre: 2.69 L/sec
FEF2575-%Change-Post: 24 %
FEF2575-%Pred-Post: 130 %
FEF2575-%Pred-Pre: 104 %
FEV1-%Change-Post: 5 %
FEV1-%Pred-Post: 105 %
FEV1-%Pred-Pre: 99 %
FEV1-Post: 3.51 L
FEV1-Pre: 3.32 L
FEV1FVC-%Change-Post: 7 %
FEV1FVC-%Pred-Pre: 103 %
FEV6-%Change-Post: -1 %
FEV6-%Pred-Post: 99 %
FEV6-%Pred-Pre: 101 %
FEV6-Post: 4.24 L
FEV6-Pre: 4.33 L
FEV6FVC-%Change-Post: 0 %
FEV6FVC-%Pred-Post: 105 %
FEV6FVC-%Pred-Pre: 105 %
FVC-%Change-Post: -1 %
FVC-%Pred-Post: 94 %
FVC-%Pred-Pre: 96 %
FVC-Post: 4.24 L
FVC-Pre: 4.33 L
Post FEV1/FVC ratio: 83 %
Post FEV6/FVC ratio: 100 %
Pre FEV1/FVC ratio: 77 %
Pre FEV6/FVC Ratio: 100 %
RV % pred: 111 %
RV: 2.68 L
TLC % pred: 102 %
TLC: 7.17 L

## 2021-03-08 NOTE — Patient Instructions (Signed)
Ok to stop advair as you have. Can you use it as needed in the future.   Please give [place] a call if any symtpoms in regards to your breathing get worse in the future.  Your breathing function tests are normal.

## 2021-03-08 NOTE — Progress Notes (Signed)
Synopsis: Referred in 12/2020 for dyspnea on exertion  Subjective:   PATIENT ID: Lance Anderson GENDER: male DOB: 1951-02-26, MRN: [id number]  Sinus congestion, solid drainage. Following ENT, doing sinus rinses.  Had work accident, hurt his shoulder. Been sedentary for a couple weeks.   HPI  Chief Complaint  Patient presents with  . Follow-up    F/U after PFT. States his breathing has been stable since last visit.    Lance Anderson is a 70 year old male, never smoker with obstructive sleep apnea and covid 19 infection 11/2019 who returns to pulmonary clinic for follow up of cough, wheezing and dyspnea on exertion.   He reports his shortness of breath and wheezing are improved since last visit. He continues to complain of sinus congestion and drainage. He has follow up with ENT in the near future. He was on ICS/LAMA therapy after last visit for a few weeks but he has stopped using these inhalers as he was feeling better and has not noticed worsening since coming off of them.   OV 01/05/21 He started to have sinus congestion with drainage, cough with mucous production, wheezing and shortness of breath. He was treated with multiple rounds of antibiotics and prednisone with some improvement of his symptoms. He reports the cough and wheezing are improved along with his sinus congestion. He reports noticing wheezing in the evening time when he is watching TV. He continues to have exertional dyspnea for example he was more winded than expected when helping a neighbor move a chair, but he reports running a quarter mile with his grandson with no issues. He denies nighttime awakenings for cough or shortness of breath.   He had a PSG on 12/28/20 at [facility] with an AHI of 10/hour and has been started on CPAP therapy last week with a nasal pillow mask. He reports it is too early to notice any benefit.   He denies reflux or heartburn symptoms. He was evaluated by [facility] and trialed on  PPI therapy for 1 month which he denies any change in his symptoms.   He was previously on symbicort 80-4.44mcg and did not notice a change in his symptoms.     He does report a 10-15lbs weight gain over the past year.  Past Medical History:  Diagnosis Date  . Abdominal aortic ectasia (HCC) 03/12/2018  . Allergy   . Cataract   . DDD (degenerative disc disease), lumbar   . Depression   . Hyperlipidemia   . Hypertension   . RBBB   . Renal insufficiency 02/25/2019  . Vasovagal syncope      Family History  Problem Relation Age of Onset  . Heart disease Father   . Lung cancer Father   . Heart attack Brother   . Heart attack Paternal Grandfather      Social History   Socioeconomic History  . Marital status: Married    Spouse name: Joyce Gross  . Number of children: 1  . Years of education: 34  . Highest education level: 12th grade  Occupational History  . Occupation: Curator    Comment: retired  Tobacco Use  . Smoking status: Never Smoker  . Smokeless tobacco: Never Used  Vaping Use  . Vaping Use: Never used  Substance and Sexual Activity  . Alcohol use: Yes    Comment: Occasionally, average 10 drinks/month, usually beer  . Drug use: No  . Sexual activity: Yes  Other Topics Concern  . 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    Social Determinants of Health   Financial Resource Strain: Low Risk   . Difficulty of Paying Living Expenses: Not hard at all  Food Insecurity: No Food Insecurity  . Worried About Programme researcher, broadcasting/film/video in the Last Year: Never true  . Ran Out of Food in the Last Year: Never true  Transportation Needs: No Transportation Needs  . Lack of Transportation (Medical): No  . Lack of Transportation (Non-Medical): No  Physical Activity: Insufficiently Active  . Days of Exercise per Week: 4 days  . Minutes of Exercise per Session: 20 min  Stress: No Stress Concern  Present  . Feeling of Stress : Not at all  Social Connections: Moderately Integrated  . Frequency of Communication with Friends and Family: More than three times a week  . Frequency of Social Gatherings with Friends and Family: More than three times a week  . Attends Religious Services: More than 4 times per year  . Active Member of Clubs or Organizations: No  . Attends Banker Meetings: Never  . Marital Status: Married  Catering manager Violence: Not At Risk  . Fear of Current or Ex-Partner: No  . Emotionally Abused: No  . Physically Abused: No  . Sexually Abused: No     Allergies  Allergen Reactions  . Codeine Other (See Comments)    Chest pain  . Paroxetine Other (See Comments)    Makes spacey  . Penicillins Diarrhea     Outpatient Medications Prior to Visit  Medication Sig Dispense Refill  . amLODipine (NORVASC) 10 MG tablet Take 1 tablet (10 mg total) by mouth daily. 90 tablet 1  . aspirin 81 MG EC tablet Take 1 tablet (81 mg total) by mouth daily. 30 tablet 11  . atorvastatin (LIPITOR) 40 MG tablet Take 1 tablet (40 mg total) by mouth at bedtime. 90 tablet 1  . FLUoxetine (PROZAC) 20 MG capsule Take 1 capsule by mouth once daily 90 capsule 0  . fluticasone (FLONASE) 50 MCG/ACT nasal spray Place 2 sprays into both nostrils daily. Please give 90 day supply 9.9 mL 6  . Fluticasone-Salmeterol (ADVAIR DISKUS) 250-50 MCG/DOSE AEPB Inhale 1 puff into the lungs in the morning and at bedtime. 60 each 5  . Misc. Devices MISC Start CPAP at 13 cm. water pressure with EPR 1.  CPAP machine with a mask and supplies for OSA with AHI 10.  AirFit P10(S) nasal pillow mask or patient preference.  Send to a local DME.  Luna II or first available    . naproxen (NAPROSYN) 500 MG tablet Take 1 tablet (500 mg total) by mouth 2 (two) times daily as needed (for pain). 60 tablet 0  . Spacer/Aero-Holding Chambers (EQ SPACE CHAMBER ANTI-STATIC) DEVI     . fluticasone furoate-vilanterol (BREO  ELLIPTA) 200-25 MCG/INH AEPB Inhale 1 puff into the lungs daily. (Patient not taking: Reported on 02/07/2021) 28 each 0   No facility-administered medications prior to visit.    Review of Systems  Constitutional: Negative for chills, fever, malaise/fatigue and weight loss.  HENT: Positive for congestion. Negative for sinus pain and sore throat.   Respiratory: Negative for cough, hemoptysis, sputum production, shortness of breath and wheezing.   Cardiovascular: Negative for chest pain, palpitations, orthopnea, claudication, leg swelling and PND.  Gastrointestinal: Negative for abdominal pain, heartburn, nausea and vomiting.  Genitourinary: Negative.   Musculoskeletal: Negative for joint pain and myalgias.  Skin: Negative for rash.  Neurological: Negative.   Endo/Heme/Allergies: Negative.   Psychiatric/Behavioral: Negative.    Objective:   Vitals:   03/08/21 1551  BP: 118/70  Pulse: 79  Temp: 97.9 F (36.6 C)  TempSrc: Temporal  SpO2: 98%  Weight: 248 lb (112.5 kg)  Height: 5\' 11"  (1.803 m)     Physical Exam Constitutional:      General: He is not in acute distress.    Appearance: He is obese. He is not ill-appearing.  HENT:     Head: Normocephalic and atraumatic.     Nose: Nose normal.     Mouth/Throat:     Mouth: Mucous membranes are moist.     Pharynx: Oropharynx is clear.  Eyes:     General: No scleral icterus.    Conjunctiva/sclera: Conjunctivae normal.     Pupils: Pupils are equal, round, and reactive to light.  Cardiovascular:     Rate and Rhythm: Normal rate and regular rhythm.     Pulses: Normal pulses.     Heart sounds: Normal heart sounds. No murmur heard.   Pulmonary:     Effort: Pulmonary effort is normal.  Abdominal:     General: Bowel sounds are normal.     Palpations: Abdomen is soft.  Musculoskeletal:     Right lower leg: No edema.     Left lower leg: No edema.  Lymphadenopathy:     Cervical: No cervical adenopathy.  Skin:    General: Skin  is warm and dry.     Capillary Refill: Capillary refill takes less than 2 seconds.  Neurological:     General: No focal deficit present.     Mental Status: He is alert.  Psychiatric:        Mood and Affect: Mood normal.        Behavior: Behavior normal.        Thought Content: Thought content normal.        Judgment: Judgment normal.    CBC    Component Value Date/Time   WBC 9.1 11/10/2020 1055   RBC 4.52 11/10/2020 1055   HGB 14.3 11/10/2020 1055   HCT 42.4 11/10/2020 1055   PLT 243 11/10/2020 1055   MCV 93.8 11/10/2020 1055   MCH 31.6 11/10/2020 1055   MCHC 33.7 11/10/2020 1055   RDW 12.5 11/10/2020 1055   BMP Latest Ref Rng & Units 11/10/2020 04/17/2020 02/24/2019  Glucose 65 - 139 mg/dL 96 97 95  BUN 7 - 25 mg/dL 20 16(X30(H) 24  Creatinine 0.70 - 1.25 mg/dL 0.961.04 0.451.01 4.091.00  BUN/Creat Ratio 6 - 22 (calc) NOT APPLICABLE 30(H) NOT APPLICABLE  Sodium 135 - 146 mmol/L 139 140 141  Potassium 3.5 - 5.3 mmol/L 4.7 4.5 4.4  Chloride 98 - 110 mmol/L 105 107 108  CO2 20 - 32 mmol/L 27 26 27   Calcium 8.6 - 10.3 mg/dL 8.8 8.9 9.0   Chest imaging: CXR 09/06/20 No edema or airspace opacity. Heart size and pulmonary vascularity are normal. No adenopathy. There are old healed rib fractures on the Right.  PFT: PFT Results Latest Ref Rng & Units 03/08/2021  FVC-Pre L 4.33  FVC-Predicted Pre % 96  FVC-Post L 4.24  FVC-Predicted Post % 94  Pre FEV1/FVC % % 77  Post FEV1/FCV % % 83  FEV1-Pre L 3.32  FEV1-Predicted Pre % 99  FEV1-Post L 3.51  DLCO uncorrected ml/min/mmHg 31.30  DLCO UNC% %  119  DLCO corrected ml/min/mmHg 31.30  DLCO COR %Predicted % 119  DLVA Predicted % 125  TLC L 7.17  TLC % Predicted % 102  RV % Predicted % 111  Normal pulmonary function testing.  Echo: Stress Echo 03/2016 No inducible ischemia or wall motion abnormalities EF 60-65% Unable to determine diastolic dysfunction RV systolic pressure normal  PSG 02/22/2950 AHI of 10.7 - mild OSA Mild to moderate  snoring in all positions CPAP titration to 13cmH20 using ResMed AirFitP10(S) nasal pillow mask    Assessment & Plan:   History of COVID-19  Discussion: Lance Anderson is a 70 year old male, never smoker with obstructive sleep apnea and covid 19 infection 11/2019 who returns to pulmonary clinic for follow up of cough, wheezing and dyspnea on exertion.    His cough, dyspnea and wheezing have all improved since the last visit in January. He improved with ICS/LABA therapy and self discontinued this treatment and has not noticed worsening of symptoms. His lungs also sound much improved on exam today. He does not require further inhaler therapy at this time.  Pulmonary function tests are within normal limits today which are reassuring.  He is followed at [facility] for his OSA.   He can follow up as needed.  Melody Comas, MD [facility] Office: [phone]    Current Outpatient Medications:  .  amLODipine (NORVASC) 10 MG tablet, Take 1 tablet (10 mg total) by mouth daily., Disp: 90 tablet, Rfl: 1 .  aspirin 81 MG EC tablet, Take 1 tablet (81 mg total) by mouth daily., Disp: 30 tablet, Rfl: 11 .  atorvastatin (LIPITOR) 40 MG tablet, Take 1 tablet (40 mg total) by mouth at bedtime., Disp: 90 tablet, Rfl: 1 .  FLUoxetine (PROZAC) 20 MG capsule, Take 1 capsule by mouth once daily, Disp: 90 capsule, Rfl: 0 .  fluticasone (FLONASE) 50 MCG/ACT nasal spray, Place 2 sprays into both nostrils daily. Please give 90 day supply, Disp: 9.9 mL, Rfl: 6 .  Fluticasone-Salmeterol (ADVAIR DISKUS) 250-50 MCG/DOSE AEPB, Inhale 1 puff into the lungs in the morning and at bedtime., Disp: 60 each, Rfl: 5 .  Misc. Devices MISC, Start CPAP at 13 cm. water pressure with EPR 1.  CPAP machine with a mask and supplies for OSA with AHI 10.  AirFit P10(S) nasal pillow mask or patient preference.  Send to a local DME.  Luna II or first available, Disp: , Rfl:  .  naproxen (NAPROSYN) 500 MG  tablet, Take 1 tablet (500 mg total) by mouth 2 (two) times daily as needed (for pain)., Disp: 60 tablet, Rfl: 0 .  Spacer/Aero-Holding Chambers (EQ SPACE CHAMBER ANTI-STATIC) DEVI, , Disp: , Rfl:

## 2021-03-13 DIAGNOSIS — R590 Localized enlarged lymph nodes: Secondary | ICD-10-CM | POA: Diagnosis not present

## 2021-03-13 DIAGNOSIS — R221 Localized swelling, mass and lump, neck: Secondary | ICD-10-CM | POA: Diagnosis not present

## 2021-03-13 DIAGNOSIS — R490 Dysphonia: Secondary | ICD-10-CM | POA: Diagnosis not present

## 2021-03-14 ENCOUNTER — Encounter: Payer: Self-pay | Admitting: Pulmonary Disease

## 2021-04-05 DIAGNOSIS — M25512 Pain in left shoulder: Secondary | ICD-10-CM | POA: Insufficient documentation

## 2021-04-05 DIAGNOSIS — G8929 Other chronic pain: Secondary | ICD-10-CM | POA: Insufficient documentation

## 2021-04-12 ENCOUNTER — Other Ambulatory Visit: Payer: Self-pay | Admitting: Orthopedic Surgery

## 2021-04-12 DIAGNOSIS — M25512 Pain in left shoulder: Secondary | ICD-10-CM

## 2021-04-13 ENCOUNTER — Ambulatory Visit (INDEPENDENT_AMBULATORY_CARE_PROVIDER_SITE_OTHER): Payer: Medicare Other | Admitting: Sports Medicine

## 2021-04-13 ENCOUNTER — Ambulatory Visit (INDEPENDENT_AMBULATORY_CARE_PROVIDER_SITE_OTHER): Payer: Medicare Other

## 2021-04-13 ENCOUNTER — Other Ambulatory Visit: Payer: Self-pay

## 2021-04-13 DIAGNOSIS — Z09 Encounter for follow-up examination after completed treatment for conditions other than malignant neoplasm: Secondary | ICD-10-CM | POA: Diagnosis not present

## 2021-04-13 DIAGNOSIS — M1712 Unilateral primary osteoarthritis, left knee: Secondary | ICD-10-CM

## 2021-04-13 DIAGNOSIS — M1711 Unilateral primary osteoarthritis, right knee: Secondary | ICD-10-CM | POA: Diagnosis not present

## 2021-04-13 MED ORDER — MELOXICAM 15 MG PO TABS: ORAL_TABLET | ORAL | 3 refills | Status: DC

## 2021-04-13 NOTE — Progress Notes (Signed)
    Procedures performed today:    Procedure: Real-time Ultrasound Guided aspiration/injection of left knee Device: Samsung [id number]  Verbal informed consent obtained.  Time-out conducted.  Noted no overlying erythema, induration, or other signs of local infection.  Skin prepped in a sterile fashion.  Local anesthesia: Topical Ethyl chloride.  With sterile technique and under real time ultrasound guidance:  Noted effusion, using an 18-gauge needle we aspirated 31 mL of clear, straw-colored fluid, syringe switched and 1 cc Kenalog 40, 2 cc lidocaine, 2 cc bupivacaine injected easily Completed without difficulty  Advised to call if fevers/chills, erythema, induration, drainage, or persistent bleeding.  Images permanently stored and available for review in PACS.  Impression: Technically successful ultrasound guided injection.  Independent interpretation of notes and tests performed by another provider:   None.  Brief History, Exam, Impression, and Recommendations:    Primary osteoarthritis of left knee This is a pleasant 70 year old male with a long history of knee pain, he had an osteochondral defect with microfracture years ago, he also had some meniscal tearing. Unfortunately he has had a recurrence of pain with swelling, today we injected and aspirated his knee. Adding formal PT, updated x-rays, switching to meloxicam, return to see me in a month.    ___________________________________________ Ihor Austin. Benjamin Stain, M.D., ABFM., CAQSM. Primary Care and Sports Medicine [facility]  Adjunct Instructor of Family Medicine  [employer]

## 2021-04-13 NOTE — Assessment & Plan Note (Signed)
This is a pleasant 70 year old male with a long history of knee pain, he had an osteochondral defect with microfracture years ago, he also had some meniscal tearing. Unfortunately he has had a recurrence of pain with swelling, today we injected and aspirated his knee. Adding formal PT, updated x-rays, switching to meloxicam, return to see me in a month.

## 2021-04-16 ENCOUNTER — Ambulatory Visit: Payer: Medicare Other | Admitting: Sports Medicine

## 2021-04-17 ENCOUNTER — Other Ambulatory Visit: Payer: Self-pay

## 2021-04-17 ENCOUNTER — Ambulatory Visit (INDEPENDENT_AMBULATORY_CARE_PROVIDER_SITE_OTHER): Payer: Medicare Other | Admitting: Rehabilitative and Restorative Service Providers"

## 2021-04-17 DIAGNOSIS — M1712 Unilateral primary osteoarthritis, left knee: Secondary | ICD-10-CM | POA: Diagnosis not present

## 2021-04-17 DIAGNOSIS — M6281 Muscle weakness (generalized): Secondary | ICD-10-CM

## 2021-04-17 DIAGNOSIS — R2689 Other abnormalities of gait and mobility: Secondary | ICD-10-CM

## 2021-04-17 DIAGNOSIS — R29898 Other symptoms and signs involving the musculoskeletal system: Secondary | ICD-10-CM | POA: Diagnosis not present

## 2021-04-17 NOTE — Therapy (Signed)
[facility] [address] [place], [place], [address] Phone: [phone]   Fax:  [phone]  Physical Therapy Evaluation  Patient Details  Name: Lance GottronJohnny Anderson MRN: [id number] Date of Birth: Jul 24, 1951 Referring Provider (PT): Dr Benjamin Stainhekkekandam   Encounter Date: 04/17/2021   PT End of Session - 04/17/21 1251    Visit Number 1    Number of Visits 12    Date for PT Re-Evaluation 05/29/21    PT Start Time 0933    PT Stop Time 1018    PT Time Calculation (min) 45 min    Activity Tolerance Patient tolerated treatment well           Past Medical History:  Diagnosis Date  . Abdominal aortic ectasia (HCC) 03/12/2018  . Allergy   . Cataract   . DDD (degenerative disc disease), lumbar   . Depression   . Hyperlipidemia   . Hypertension   . RBBB   . Renal insufficiency 02/25/2019  . Vasovagal syncope     Past Surgical History:  Procedure Laterality Date  . CATARACT EXTRACTION, BILATERAL    . KNEE ARTHROSCOPY W/ MENISCAL REPAIR Left   . ROTATOR CUFF REPAIR Right   . WISDOM TOOTH EXTRACTION      There were no vitals filed for this visit.    Subjective Assessment - 04/17/21 0935    Subjective Patient reports that Lt knee has increased over the past two years especially in the 6 months. He was unable to go to the gym in the past two yrs due to covid restrictions.    Pertinent History microfracture surgery Lt knee 2006 - some intermittent problems since that time; RCR Rt shd 1998; HTN; sleep apnea; arthritis; neck pain    Patient Stated Goals get the knee and leg stronger    Currently in Pain? No/denies    Pain Score 0-No pain    Pain Location Knee    Pain Orientation Left    Pain Type Chronic pain    Pain Onset More than a month ago    Pain Frequency Intermittent    Aggravating Factors  steps; inclines; rough ground; sit to stand    Pain Relieving Factors injection              [facility] PT Assessment - 04/17/21  0001      Assessment   Medical Diagnosis OA Lt knee    Referring Provider (PT) Dr Benjamin Stainhekkekandam    Onset Date/Surgical Date 10/23/20   gradual increase in pain in the past 2 yrs   Hand Dominance Right    Next MD Visit 05/11/21    Prior Therapy yes      Precautions   Precautions None      Restrictions   Weight Bearing Restrictions No      Balance Screen   Has the patient fallen in the past 6 months Yes    How many times? 2    Has the patient had a decrease in activity level because of a fear of falling?  No    Is the patient reluctant to leave their home because of a fear of falling?  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, [employer] storage pods -  lifting up to 45# or more    Leisure was working in the gym until covid; yard work; 4 wheelers; play with grands; riding bicycle      Observation/Other Assessments   Focus on Therapeutic Outcomes (FOTO)  41      Sensation   Additional Comments WFL's      Posture/Postural Control   Posture Comments head forward shoulders rounded and elevated; flexed forward at hips      AROM   Right Knee Extension 0    Right Knee Flexion 135    Left Knee Extension -10    Left Knee Flexion 121      Strength   Right Hip Flexion 5/5    Right Hip Extension 5/5    Right Hip ABduction 5/5    Left Hip Flexion 4+/5    Left Hip Extension 4+/5    Left Hip ABduction 4+/5    Right Knee Flexion 5/5    Right Knee Extension 5/5    Left Knee Flexion 5/5    Left Knee Extension 4+/5    Right Ankle Plantar Flexion 5/5    Left Ankle Plantar Flexion 4+/5      Flexibility   Hamstrings tight Lt    Quadriceps heel ~ 6 inches from buttock Rt; 9 inches Lt    ITB tight Lt    Piriformis tight Lt      Palpation   Palpation  comment no pain Lt knee      Ambulation/Gait   Gait Comments limp Lt LE with wt bearing Lt LE                      Objective measurements completed on examination: See above findings.       [facility] Adult PT Treatment/Exercise - 04/17/21 0001      Knee/Hip Exercises: Stretches   Active Hamstring Stretch Left;2 reps;30 seconds   seated   Passive Hamstring Stretch Left;2 reps;30 seconds   supine with strap   Quad Stretch Left;2 reps;30 seconds   prone with strap   Gastroc Stretch Left;2 reps;20 seconds   standing   Soleus Stretch Left;2 reps;20 seconds   standing     Knee/Hip Exercises: Standing   SLS 20 sec x 3 reps      Knee/Hip Exercises: Seated   Sit to Sand 5 reps;without UE support   slow eccentric lowering stand to sit                 PT Education - 04/17/21 1010    Education Details HEP POC    Person(s) Educated Patient    Methods Explanation;Demonstration;Tactile cues;Verbal cues;Handout    Comprehension Verbalized understanding;Returned demonstration;Verbal cues required;Tactile cues required               PT Long Term Goals - 04/17/21 1256      PT LONG TERM GOAL #1   Title Increase Lt knee extension and flexion by 5 degrees    Time 6    Period Weeks    Status New    Target Date 05/29/21      PT LONG TERM GOAL #2   Title increase Lt LE strength to 5/5    Time 6    Period Weeks    Status New    Target Date 05/29/21      PT LONG TERM GOAL #3   Title Patient to report increased functional strength for ADL's and work related tasks    Time 6  Period Weeks    Status New    Target Date 05/29/21      PT LONG TERM GOAL #4   Title Independent in HEP    Time 6    Period Weeks    Status New    Target Date 05/29/21      PT LONG TERM GOAL #5   Title Improve functional limitation score to 68    Time 6    Period Weeks    Status New    Target Date 05/29/21                  Plan - 04/17/21 1251    Clinical Impression  Statement Patient presents with ~ 2 year history of gradually increasing Lt knee pain with symptoms significantly increased in the past 6 months. He had to stop working on in the gym due to covid and attributes some of the increase in symptoms to increased weakness in the Lt LE. Patient has a history of Lt knee pain and dysfunction for the past 20 years. He has abnormal gait pattern; limited Lt knee ROM/mobility; weakness; pain with functional activities. Patient will benefit from PT to address problems identified.    Stability/Clinical Decision Making Stable/Uncomplicated    Clinical Decision Making Low    Rehab Potential Good    PT Frequency 2x / week    PT Duration 6 weeks    PT Treatment/Interventions ADLs/Self Care Home Management;Aquatic Therapy;Cryotherapy;Electrical Stimulation;Iontophoresis 4mg /ml Dexamethasone;Moist Heat;Ultrasound;Gait training;Stair training;Functional mobility training;Therapeutic activities;Therapeutic exercise;Balance training;Neuromuscular re-education;Patient/family education;Manual techniques;Passive range of motion;Dry needling;Taping    PT Next Visit Plan review HEP; progress with strengthening Lt LE; modaliites as indicated    PT Home Exercise Plan [email or web address]    Consulted and Agree with Plan of Care Patient           Patient will benefit from skilled therapeutic intervention in order to improve the following deficits and impairments:  Abnormal gait,Decreased range of motion,Pain,Decreased activity tolerance,Impaired flexibility,Improper body mechanics,Decreased mobility,Decreased strength,Postural dysfunction  Visit Diagnosis: Arthritis of left knee  Muscle weakness (generalized)  Other symptoms and signs involving the musculoskeletal system  Other abnormalities of gait and mobility     Problem List Patient Active Problem List   Diagnosis Date Noted  . COVID-19 virus infection 12/20/2019  . Renal insufficiency 02/25/2019  . Primary osteoarthritis  involving multiple joints 02/24/2019  . Cervical radiculopathy 10/30/2018  . Right bundle branch block (RBBB) 05/29/2018  . Sinus bradycardia 05/29/2018  . Peripheral edema 04/27/2018  . Venous stasis dermatitis of right lower extremity 04/27/2018  . Strain of calf muscle 04/27/2018  . Abdominal aortic ectasia (HCC) 03/12/2018  . 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 Recurrent major depressive disorder, in full remission (HCC) 02/25/2018  . Encounter for long-term (current) use of medications 02/25/2018  . Encounter for abdominal aortic aneurysm (AAA) screening 02/25/2018  . Dyslipidemia, goal LDL below 100 02/25/2018  . Refused influenza vaccine 02/25/2018  . Refused pneumococcal vaccination 02/25/2018  . Pruritus 08/04/2017  . Encounter for monitoring statin therapy 08/04/2017  . Depression 05/21/2017  . Family history of heart disease in male family member before age 41 05/21/2017  . Primary osteoarthritis of left knee 05/21/2017  . Class 1 obesity due to excess calories with serious comorbidity in adult 05/21/2017  . Allergic rhinitis 05/21/2017  . DDD (degenerative disc disease), lumbosacral 05/21/2017  . Vasovagal episode 05/21/2017  . Essential hypertension with goal blood pressure less than 140/90 08/17/2015  . Pseudophakia of both eyes  10/20/2013  . Dyslipidemia 08/10/2013    Lance Anderson PT, MPH  04/17/2021, 1:00 PM  [facility] [address] [place], [place], [address] Phone: [phone]   Fax:  [phone]  Name: Lance Anderson MRN: [id number] Date of Birth: [birth year]

## 2021-04-17 NOTE — Patient Instructions (Signed)
Access Code: [email or web address]: [email or web address]: 04/26/2022Prepared by: Sanad Fearnow HoltExercises  Prone Quadriceps Stretch with Strap - 2 x daily - 7 x weekly - 1 sets - 3 reps - 30 sec hold  Hooklying Hamstring Stretch with Strap - 2 x daily - 7 x weekly - 1 sets - 3 reps - 30 sec hold  Seated Hamstring Stretch - 2 x daily - 7 x weekly - 1 sets - 3 reps - 30 sec hold  Gastroc Stretch on Wall - 2 x daily - 7 x weekly - 1 sets - 3 reps - 30 sec hold  Soleus Stretch on Wall - 2 x daily - 7 x weekly - 1 sets - 3 reps - 30 sec hold  Sit to Stand - 2 x daily - 7 x weekly - 1 sets - 10 reps - 3-5 sec hold  Single Leg Stance - 2 x daily - 7 x weekly - 2 sets - 5 reps - 20 sec hold

## 2021-04-20 ENCOUNTER — Other Ambulatory Visit: Payer: Self-pay

## 2021-04-20 ENCOUNTER — Encounter: Payer: Self-pay | Admitting: Physical Therapy

## 2021-04-20 ENCOUNTER — Ambulatory Visit (INDEPENDENT_AMBULATORY_CARE_PROVIDER_SITE_OTHER): Payer: Medicare Other | Admitting: Physical Therapy

## 2021-04-20 DIAGNOSIS — M1712 Unilateral primary osteoarthritis, left knee: Secondary | ICD-10-CM | POA: Diagnosis not present

## 2021-04-20 DIAGNOSIS — R29898 Other symptoms and signs involving the musculoskeletal system: Secondary | ICD-10-CM

## 2021-04-20 DIAGNOSIS — M6281 Muscle weakness (generalized): Secondary | ICD-10-CM | POA: Diagnosis not present

## 2021-04-20 DIAGNOSIS — R2689 Other abnormalities of gait and mobility: Secondary | ICD-10-CM | POA: Diagnosis not present

## 2021-04-20 NOTE — Therapy (Signed)
[facility] [place] [address] [place], [place], [address] Phone: [phone]   Fax:  [phone]  Physical Therapy Treatment  Patient Details  Name: Lance Anderson MRN: [id number] Date of Birth: 1951-12-13 Referring Provider (PT): Dr Benjamin Stain   Encounter Date: 04/20/2021   PT End of Session - 04/20/21 0847    Visit Number 2    Number of Visits 12    Date for PT Re-Evaluation 05/29/21    PT Start Time 0844    PT Stop Time 0923    PT Time Calculation (min) 39 min    Activity Tolerance Patient tolerated treatment well    Behavior During Therapy [facility] for tasks assessed/performed           Past Medical History:  Diagnosis Date  . Abdominal aortic ectasia (HCC) 03/12/2018  . Allergy   . Cataract   . DDD (degenerative disc disease), lumbar   . Depression   . Hyperlipidemia   . Hypertension   . RBBB   . Renal insufficiency 02/25/2019  . Vasovagal syncope     Past Surgical History:  Procedure Laterality Date  . CATARACT EXTRACTION, BILATERAL    . KNEE ARTHROSCOPY W/ MENISCAL REPAIR Left   . ROTATOR CUFF REPAIR Right   . WISDOM TOOTH EXTRACTION      There were no vitals filed for this visit.   Subjective Assessment - 04/20/21 0848    Subjective Pt reports he performed HEP yesterday. No other changes.    Patient Stated Goals get the knee and leg stronger    Currently in Pain? Yes    Pain Score 3     Pain Location Knee    Pain Orientation Left    Pain Descriptors / Indicators Aching;Nagging              [facility] PT Assessment - 04/20/21 0001      Assessment   Medical Diagnosis OA Lt knee    Referring Provider (PT) Dr Benjamin Stain    Onset Date/Surgical Date 10/23/20   gradual increase in pain in the past 2 yrs   Hand Dominance Right    Next MD Visit 05/11/21    Prior Therapy yes            [facility] Adult PT Treatment/Exercise - 04/20/21 0001      Self-Care   Self-Care Other Self-Care Comments    Other  Self-Care Comments  Pt educated on self massage with roller stick to LLE. Pt returned demo with cues.      Knee/Hip Exercises: Stretches   Passive Hamstring Stretch 20 seconds, 2 reps;Right;Left    Quad Stretch Left;20 seconds;6 reps;Right;1 rep    Quad Stretch Limitations (prone, standing, and seated- 2 in each position)    Hip Flexor Stretch Left;Right;2 reps;20 seconds    Gastroc Stretch Left;2 reps;20 seconds    Soleus Stretch Left;2 reps;20 seconds      Knee/Hip Exercises: Standing   SLS 20 sec x 3 reps each leg. last rep with horiz head turns.      Knee/Hip Exercises: Seated   Sit to Sand without UE support;10 reps   slow eccentric lowering stand to sit     Manual Therapy   Manual Therapy Soft tissue mobilization    Soft tissue mobilization IASTM to distal Lt quad to decrease fascial restrictions and improve mobility.            PT Long Term Goals - 04/17/21 1256      PT  LONG TERM GOAL #1   Title Increase Lt knee extension and flexion by 5 degrees    Time 6    Period Weeks    Status New    Target Date 05/29/21      PT LONG TERM GOAL #2   Title increase Lt LE strength to 5/5    Time 6    Period Weeks    Status New    Target Date 05/29/21      PT LONG TERM GOAL #3   Title Patient to report increased functional strength for ADL's and work related tasks    Time 6    Period Weeks    Status New    Target Date 05/29/21      PT LONG TERM GOAL #4   Title Independent in HEP    Time 6    Period Weeks    Status New    Target Date 05/29/21      PT LONG TERM GOAL #5   Title Improve functional limitation score to 68    Time 6    Period Weeks    Status New    Target Date 05/29/21                 Plan - 04/20/21 0900    Clinical Impression Statement Pt reported reduction of Lt knee pain with bicycle and stretches.  Pain returned with sit to/from stand; eliminated with IASTM to distal quad. Pt shown 2 alternatives for quad stretch with good tolerance.  Encouraged pt to ride his recumbent bike at home and incorporate stretches into his daily schedule.  Goals are ongoing.    Stability/Clinical Decision Making Stable/Uncomplicated    Rehab Potential Good    PT Frequency 2x / week    PT Duration 6 weeks    PT Treatment/Interventions ADLs/Self Care Home Management;Aquatic Therapy;Cryotherapy;Electrical Stimulation;Iontophoresis 4mg /ml Dexamethasone;Moist Heat;Ultrasound;Gait training;Stair training;Functional mobility training;Therapeutic activities;Therapeutic exercise;Balance training;Neuromuscular re-education;Patient/family education;Manual techniques;Passive range of motion;Dry needling;Taping    PT Next Visit Plan progress with strengthening Lt LE; manual therapy to mid/upper Lt quad.    PT Home Exercise Plan [email or web address]    Consulted and Agree with Plan of Care Patient           Patient will benefit from skilled therapeutic intervention in order to improve the following deficits and impairments:  Abnormal gait,Decreased range of motion,Pain,Decreased activity tolerance,Impaired flexibility,Improper body mechanics,Decreased mobility,Decreased strength,Postural dysfunction  Visit Diagnosis: Arthritis of left knee  Muscle weakness (generalized)  Other symptoms and signs involving the musculoskeletal system  Other abnormalities of gait and mobility     Problem List Patient Active Problem List   Diagnosis Date Noted  . COVID-19 virus infection 12/20/2019  . Renal insufficiency 02/25/2019  . 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 Cervical radiculopathy 10/30/2018  . Right bundle branch block (RBBB) 05/29/2018  . Sinus bradycardia 05/29/2018  . Peripheral edema 04/27/2018  . Venous stasis dermatitis of right lower extremity 04/27/2018  . Strain of calf muscle 04/27/2018  . Abdominal aortic ectasia (HCC) 03/12/2018  . 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 Recurrent major depressive disorder, in full  remission (HCC) 02/25/2018  . Encounter for long-term (current) use of medications 02/25/2018  . Encounter for abdominal aortic aneurysm (AAA) screening 02/25/2018  . Dyslipidemia, goal LDL below 100 02/25/2018  . Refused influenza vaccine 02/25/2018  . Refused pneumococcal vaccination 02/25/2018  . Pruritus 08/04/2017  . Encounter for monitoring statin therapy 08/04/2017  . Depression 05/21/2017  . Family history  of heart disease in male family member before age 58 05/21/2017  . Primary osteoarthritis of left knee 05/21/2017  . Class 1 obesity due to excess calories with serious comorbidity in adult 05/21/2017  . Allergic rhinitis 05/21/2017  . DDD (degenerative disc disease), lumbosacral 05/21/2017  . Vasovagal episode 05/21/2017  . Essential hypertension with goal blood pressure less than 140/90 08/17/2015  . Pseudophakia of both eyes 10/20/2013  . Dyslipidemia 08/10/2013   Mayer Camel, PTA 04/20/21 9:27 AM  [facility] [address] [place], [place], [address] Phone: [phone]   Fax:  [phone]  Name: Lance Anderson MRN: [id number] Date of Birth: 10/08/1951

## 2021-04-23 ENCOUNTER — Ambulatory Visit (INDEPENDENT_AMBULATORY_CARE_PROVIDER_SITE_OTHER): Payer: Medicare Other | Admitting: Physical Therapy

## 2021-04-23 ENCOUNTER — Other Ambulatory Visit: Payer: Self-pay

## 2021-04-23 ENCOUNTER — Encounter: Payer: Self-pay | Admitting: Physical Therapy

## 2021-04-23 DIAGNOSIS — M1712 Unilateral primary osteoarthritis, left knee: Secondary | ICD-10-CM

## 2021-04-23 DIAGNOSIS — R29898 Other symptoms and signs involving the musculoskeletal system: Secondary | ICD-10-CM

## 2021-04-23 DIAGNOSIS — M6281 Muscle weakness (generalized): Secondary | ICD-10-CM | POA: Diagnosis not present

## 2021-04-23 NOTE — Patient Instructions (Signed)
Access Code: [email or web address] URL: [email or web address] Date: 04/23/2021 Prepared by: [facility]  Exercises Prone Quadriceps Stretch with Strap - 2 x daily - 7 x weekly - 1 sets - 3 reps - 30 sec hold Hooklying Hamstring Stretch with Strap - 2 x daily - 7 x weekly - 1 sets - 3 reps - 30 sec hold Seated Hamstring Stretch - 2 x daily - 7 x weekly - 1 sets - 3 reps - 30 sec hold Gastroc Stretch on Wall - 2 x daily - 7 x weekly - 1 sets - 3 reps - 30 sec hold Soleus Stretch on Wall - 2 x daily - 7 x weekly - 1 sets - 3 reps - 30 sec hold Sit to Stand - 2 x daily - 7 x weekly - 1 sets - 10 reps - 3-5 sec hold Single Leg Stance - 2 x daily - 7 x weekly - 1 sets - 3 reps - 20 sec hold Single Leg Heel Raise with Counter Support - 1 x daily - 3 x weekly - 2 sets - 10 reps Single-Leg Quarter Squat - 1 x daily - 3 x weekly - 2 sets - 10 reps

## 2021-04-23 NOTE — Therapy (Signed)
[facility] [place] [address] [place], [place], [address] Phone: [phone]   Fax:  [phone]  Physical Therapy Treatment  Patient Details  Name: Lance Anderson MRN: [id number] Date of Birth: March 18, 1951 Referring Provider (PT): Dr Benjamin Stain   Encounter Date: 04/23/2021   PT End of Session - 04/23/21 0932    Visit Number 3    Number of Visits 12    Date for PT Re-Evaluation 05/29/21    PT Start Time 0845    PT Stop Time 0926    PT Time Calculation (min) 41 min    Activity Tolerance Patient tolerated treatment well    Behavior During Therapy [facility] for tasks assessed/performed           Past Medical History:  Diagnosis Date  . Abdominal aortic ectasia (HCC) 03/12/2018  . Allergy   . Cataract   . DDD (degenerative disc disease), lumbar   . Depression   . Hyperlipidemia   . Hypertension   . RBBB   . Renal insufficiency 02/25/2019  . Vasovagal syncope     Past Surgical History:  Procedure Laterality Date  . CATARACT EXTRACTION, BILATERAL    . KNEE ARTHROSCOPY W/ MENISCAL REPAIR Left   . ROTATOR CUFF REPAIR Right   . WISDOM TOOTH EXTRACTION      There were no vitals filed for this visit.   Subjective Assessment - 04/23/21 0846    Subjective Pt reports that he was on his feet all day this weekend, with no issues.    Patient Stated Goals get the knee and leg stronger    Currently in Pain? No/denies    Pain Score 0-No pain              [facility] PT Assessment - 04/23/21 0001      Assessment   Medical Diagnosis OA Lt knee    Referring Provider (PT) Dr Benjamin Stain    Onset Date/Surgical Date 10/23/20   gradual increase in pain in the past 2 yrs   Hand Dominance Right    Next MD Visit 05/11/21    Prior Therapy yes      Functional Tests   Functional tests Single leg stance      Single Leg Stance   Comments Lt/Rt 20 sec      AROM   Right Knee Extension -7    Right Knee Flexion 129    Left Knee Extension  -8    Left Knee Flexion 121      Strength   Left Hip Flexion 5/5    Left Hip ABduction 5/5    Left Knee Extension 5/5            [facility] Adult PT Treatment/Exercise - 04/23/21 0001      Knee/Hip Exercises: Stretches   Passive Hamstring Stretch Left;Right;2 reps;30 seconds    Quad Stretch Left;Right;2 reps;30 seconds   standing   Gastroc Stretch Both;2 reps;30 seconds 2 sets     Knee/Hip Exercises: Aerobic   Recumbent Bike L6-7: 3 min for warm up      Knee/Hip Exercises: Standing   Heel Raises Right;Left;1 set;20 reps    Lateral Step Up Left;1 set;10 reps;Hand Hold: 1;Step Height: 6"   Rt foot taps   SLS Lt/Rt SLS on blue pad x 30 sec x 2 reps each leg.  SLS on blue pad with toe taps forward, side, back x 10    Other Standing Knee Exercises Lt SLS with forward leans to touch 12"  step x 3, switched to touching seat of chair x 7.    Other Standing Knee Exercises single leg squats with opp leg lateral slide x 10 each side (easier on RLE)      Manual Therapy   Soft tissue mobilization IASTM to Lt quadricep and Lt ant tib to decrease fascial restrictions and improve mobility.                  PT Education - 04/23/21 0930    Education Details HEP updated    Person(s) Educated Patient    Methods Explanation;Demonstration;Verbal cues    Comprehension Returned demonstration;Verbalized understanding               PT Long Term Goals - 04/23/21 0847      PT LONG TERM GOAL #1   Title Increase Lt knee extension and flexion by 5 degrees    Time 6    Period Weeks    Status On-going      PT LONG TERM GOAL #2   Title increase Lt LE strength to 5/5    Time 6    Period Weeks    Status On-going      PT LONG TERM GOAL #3   Title Patient to report increased functional strength for ADL's and work related tasks    Time 6    Period Weeks    Status On-going      PT LONG TERM GOAL #4   Title Independent in HEP    Time 6    Period Weeks    Status On-going      PT LONG  TERM GOAL #5   Title Improve functional limitation score to 68    Time 6    Period Weeks    Status On-going                 Plan - 04/23/21 0932    Clinical Impression Statement Pt demonstrated improved Lt hip/knee strength.  He tolerated all exercises including squats without any production of Lt knee pain.  Pt making great strides towards LTGs.    Stability/Clinical Decision Making Stable/Uncomplicated    Rehab Potential Good    PT Frequency 2x / week    PT Duration 6 weeks    PT Treatment/Interventions ADLs/Self Care Home Management;Aquatic Therapy;Cryotherapy;Electrical Stimulation;Iontophoresis 4mg /ml Dexamethasone;Moist Heat;Ultrasound;Gait training;Stair training;Functional mobility training;Therapeutic activities;Therapeutic exercise;Balance training;Neuromuscular re-education;Patient/family education;Manual techniques;Passive range of motion;Dry needling;Taping    PT Next Visit Plan assess readiness to d/c.  FOTO. hip ext strength.    PT Home Exercise Plan [id number]    Consulted and Agree with Plan of Care Patient           Patient will benefit from skilled therapeutic intervention in order to improve the following deficits and impairments:  Abnormal gait,Decreased range of motion,Pain,Decreased activity tolerance,Impaired flexibility,Improper body mechanics,Decreased mobility,Decreased strength,Postural dysfunction  Visit Diagnosis: Arthritis of left knee  Muscle weakness (generalized)  Other symptoms and signs involving the musculoskeletal system     Problem List Patient Active Problem List   Diagnosis Date Noted  . COVID-19 virus infection 12/20/2019  . Renal insufficiency 02/25/2019  . 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 Cervical radiculopathy 10/30/2018  . Right bundle branch block (RBBB) 05/29/2018  . Sinus bradycardia 05/29/2018  . Peripheral edema 04/27/2018  . Venous stasis dermatitis of right lower extremity 04/27/2018   . Strain of calf muscle 04/27/2018  . Abdominal aortic ectasia (HCC) 03/12/2018  . 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  Recurrent major depressive disorder, in full remission (HCC) 02/25/2018  . Encounter for long-term (current) use of medications 02/25/2018  . Encounter for abdominal aortic aneurysm (AAA) screening 02/25/2018  . Dyslipidemia, goal LDL below 100 02/25/2018  . Refused influenza vaccine 02/25/2018  . Refused pneumococcal vaccination 02/25/2018  . Pruritus 08/04/2017  . Encounter for monitoring statin therapy 08/04/2017  . Depression 05/21/2017  . Family history of heart disease in male family member before age 7 05/21/2017  . Primary osteoarthritis of left knee 05/21/2017  . Class 1 obesity due to excess calories with serious comorbidity in adult 05/21/2017  . Allergic rhinitis 05/21/2017  . DDD (degenerative disc disease), lumbosacral 05/21/2017  . Vasovagal episode 05/21/2017  . Essential hypertension with goal blood pressure less than 140/90 08/17/2015  . Pseudophakia of both eyes 10/20/2013  . Dyslipidemia 08/10/2013   Mayer Camel, PTA 04/23/21 12:09 PM  [facility] [place] [address] [place], [place], [address] Phone: [phone]   Fax:  [phone]  Name: Lance Anderson MRN: [id number] Date of Birth: 1951/03/09

## 2021-04-24 ENCOUNTER — Telehealth: Payer: Self-pay

## 2021-04-24 DIAGNOSIS — R591 Generalized enlarged lymph nodes: Secondary | ICD-10-CM | POA: Diagnosis not present

## 2021-04-24 NOTE — Telephone Encounter (Signed)
Beth with [facility] called and said that Everlene Farrier, PA would like to speak with Joy regarding this pt. He can be reached at::  [phone] Everlene Farrier, PA

## 2021-04-24 NOTE — Telephone Encounter (Signed)
Call returned to Everlene Farrier, [place] at [facility] as requested. Suspicious findings on CT concerning prominent lymph nodes. Already established with Pulmonology so recommend follow up with them for further evaluation.   Thayer Ohm, DNP, APRN, FNP-BC [facility]

## 2021-04-25 ENCOUNTER — Encounter: Payer: Medicare Other | Admitting: Physical Therapy

## 2021-04-30 ENCOUNTER — Encounter: Payer: Self-pay | Admitting: Physical Therapy

## 2021-04-30 ENCOUNTER — Other Ambulatory Visit: Payer: Self-pay

## 2021-04-30 ENCOUNTER — Ambulatory Visit (INDEPENDENT_AMBULATORY_CARE_PROVIDER_SITE_OTHER): Payer: Medicare Other | Admitting: Physical Therapy

## 2021-04-30 DIAGNOSIS — M1712 Unilateral primary osteoarthritis, left knee: Secondary | ICD-10-CM | POA: Diagnosis not present

## 2021-04-30 DIAGNOSIS — M6281 Muscle weakness (generalized): Secondary | ICD-10-CM | POA: Diagnosis not present

## 2021-04-30 DIAGNOSIS — R29898 Other symptoms and signs involving the musculoskeletal system: Secondary | ICD-10-CM

## 2021-04-30 NOTE — Therapy (Signed)
[place], [place], [address] Phone: [phone]   Fax:  [phone]  Physical Therapy Treatment  Patient Details  Name: Lance Anderson MRN: [id number] Date of Birth: [birth year] Referring Provider (PT): Dr Dianah Field   Encounter Date: 04/30/2021   PT End of Session - 04/30/21 0854    Visit Number 4    Number of Visits 12    Date for PT Re-Evaluation 05/29/21    PT Start Time 0848    PT Stop Time 0926    PT Time Calculation (min) 38 min    Activity Tolerance Patient tolerated treatment well    Behavior During Therapy [facility] for tasks assessed/performed           Past Medical History:  Diagnosis Date  . Abdominal aortic ectasia ([place]) 03/12/2018  . Allergy   . Cataract   . DDD (degenerative disc disease), lumbar   . Depression   . Hyperlipidemia   . Hypertension   . RBBB   . Renal insufficiency 02/25/2019  . Vasovagal syncope     Past Surgical History:  Procedure Laterality Date  . CATARACT EXTRACTION, BILATERAL    . KNEE ARTHROSCOPY W/ MENISCAL REPAIR Left   . ROTATOR CUFF REPAIR Right   . WISDOM TOOTH EXTRACTION      There were no vitals filed for this visit.   Subjective Assessment - 04/30/21 0851    Subjective Pt reports that his Lt knee had some pain and catching yesterday (up to 4/10), "but it didn't stop me from doing anything". Woke up with pain this morning, but it has since resolved.  He has started riding his recumbent bike for 10 min daily last week. "My knee still feels better than it has in months".    Pertinent History microfracture surgery Lt knee 2006 - some intermittent problems since that time; [place]; HTN; sleep apnea; arthritis; neck pain    Patient Stated Goals get the knee and leg stronger    Currently in Pain? No/denies    Pain Score 0-No pain              [facility] PT Assessment - 04/30/21 0001      Assessment   Medical Diagnosis OA Lt knee     Referring Provider (PT) Dr Dianah Field    Onset Date/Surgical Date 10/23/20   gradual increase in pain in the past 2 yrs   Hand Dominance Right    Next MD Visit 05/11/21    Prior Therapy yes      AROM   Right Knee Extension -6    Right Knee Flexion 129    Left Knee Extension -6    Left Knee Flexion 123      Strength   Right Knee Flexion 5/5    Left Knee Flexion --   5-/5           [facility] Adult PT Treatment/Exercise - 04/30/21 0001      Knee/Hip Exercises: Stretches   Passive Hamstring Stretch Right;Left;3 reps;20 seconds   with overpressure from pt into ext   Hip Flexor Stretch Left;Right;1 rep;30 seconds   seated   Gastroc Stretch Right;Left;2 reps;20 seconds      Knee/Hip Exercises: Aerobic   Recumbent Bike L5: 5 min      Knee/Hip Exercises: Standing   Heel Raises Right;Left;1 set;10 reps    SLS Rt/Lt SLS x 30 sec      Knee/Hip Exercises: Seated   Stool Scoot -  Round Trips 25 ft on carpet    Sit to Sand 1 set;5 reps;without UE support   staggered stance, left foot back; eccentric lowering     Manual Therapy   Soft tissue mobilization IASTM to Lt quadricep and Lt ant tib to decrease fascial restrictions and improve mobility.                  PT Education - 04/30/21 0948    Education Details Time spent assisting pt with install of [place] app and instructing on how to manuever through program.    Person(s) Educated Patient    Methods Explanation    Comprehension Verbalized understanding               PT Long Term Goals - 04/30/21 0950      PT LONG TERM GOAL #1   Title Increase Lt knee extension and flexion by 5 degrees    Time 6    Period Weeks    Status On-going      PT LONG TERM GOAL #2   Title increase Lt LE strength to 5/5    Time 6    Period Weeks    Status Partially Met      PT LONG TERM GOAL #3   Title Patient to report increased functional strength for ADL's and work related tasks    Time 6    Period Weeks    Status Achieved       PT LONG TERM GOAL #4   Title Independent in HEP    Time 6    Period Weeks    Status On-going      PT LONG TERM GOAL #5   Title Improve functional limitation score to 68    Time 6    Period Weeks    Status On-going                 Plan - 04/30/21 0940    Clinical Impression Statement Pt had incidence of Lt knee pain after running with grandkids over the weekend.  Some palpable tightness along Lt rectus femoris; softened with IASTM.  Some mild pain reported in Lt quad tendon area with staggered stance stand to sit (eccentrically lowering); pain resolves with rest. Pt's Lt knee ROM has improved; very near ROM in RLE. Pt continues to make good gains towards goals.    Stability/Clinical Decision Making Stable/Uncomplicated    Rehab Potential Good    PT Frequency 2x / week    PT Duration 6 weeks    PT Treatment/Interventions ADLs/Self Care Home Management;Aquatic Therapy;Cryotherapy;Electrical Stimulation;Iontophoresis 21m/ml Dexamethasone;Moist Heat;Ultrasound;Gait training;Stair training;Functional mobility training;Therapeutic activities;Therapeutic exercise;Balance training;Neuromuscular re-education;Patient/family education;Manual techniques;Passive range of motion;Dry needling;Taping    PT Next Visit Plan assess readiness to d/c.  FOTO. hip ext strength.  MD note.    PT Home Exercise Plan [id number]    Consulted and Agree with Plan of Care Patient           Patient will benefit from skilled therapeutic intervention in order to improve the following deficits and impairments:  Abnormal gait,Decreased range of motion,Pain,Decreased activity tolerance,Impaired flexibility,Improper body mechanics,Decreased mobility,Decreased strength,Postural dysfunction  Visit Diagnosis: Arthritis of left knee  Muscle weakness (generalized)  Other symptoms and signs involving the musculoskeletal system     Problem List Patient Active Problem List   Diagnosis Date Noted  . COVID-19  virus infection 12/20/2019  . Renal insufficiency 02/25/2019  . 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 Cervical radiculopathy 10/30/2018  .  Right bundle branch block (RBBB) 05/29/2018  . Sinus bradycardia 05/29/2018  . Peripheral edema 04/27/2018  . Venous stasis dermatitis of right lower extremity 04/27/2018  . Strain of calf muscle 04/27/2018  . Abdominal aortic ectasia ([place]) 03/12/2018  . 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 Recurrent major depressive disorder, in full remission ([place]) 02/25/2018  . Encounter for long-term (current) use of medications 02/25/2018  . Encounter for abdominal aortic aneurysm (AAA) screening 02/25/2018  . Dyslipidemia, goal LDL below 100 02/25/2018  . Refused influenza vaccine 02/25/2018  . Refused pneumococcal vaccination 02/25/2018  . Pruritus 08/04/2017  . Encounter for monitoring statin therapy 08/04/2017  . Depression 05/21/2017  . Family history of heart disease in male family member before age 109 05/21/2017  . Primary osteoarthritis of left knee 05/21/2017  . Class 1 obesity due to excess calories with serious comorbidity in adult 05/21/2017  . Allergic rhinitis 05/21/2017  . DDD (degenerative disc disease), lumbosacral 05/21/2017  . Vasovagal episode 05/21/2017  . Essential hypertension with goal blood pressure less than 140/90 08/17/2015  . Pseudophakia of both eyes 10/20/2013  . Dyslipidemia 08/10/2013    Shelbie Hutching 04/30/2021, 9:51 AM  [facility] [place], [place], [address] Phone: [phone]   Fax:  [phone]  Name: Ram Haugan MRN: [id number] Date of Birth: Oct 26, 1951

## 2021-05-09 ENCOUNTER — Other Ambulatory Visit: Payer: Self-pay

## 2021-05-09 ENCOUNTER — Ambulatory Visit (INDEPENDENT_AMBULATORY_CARE_PROVIDER_SITE_OTHER): Payer: Medicare Other | Admitting: Pulmonary Disease

## 2021-05-09 ENCOUNTER — Encounter: Payer: Self-pay | Admitting: Pulmonary Disease

## 2021-05-09 ENCOUNTER — Telehealth: Payer: Self-pay | Admitting: Pulmonary Disease

## 2021-05-09 VITALS — BP 118/76 | HR 60 | Ht 71.0 in | Wt 251.0 lb

## 2021-05-09 DIAGNOSIS — R590 Localized enlarged lymph nodes: Secondary | ICD-10-CM | POA: Diagnosis not present

## 2021-05-09 DIAGNOSIS — S80862A Insect bite (nonvenomous), left lower leg, initial encounter: Secondary | ICD-10-CM

## 2021-05-09 DIAGNOSIS — S83209A Unspecified tear of unspecified meniscus, current injury, unspecified knee, initial encounter: Secondary | ICD-10-CM | POA: Insufficient documentation

## 2021-05-09 DIAGNOSIS — W57XXXA Bitten or stung by nonvenomous insect and other nonvenomous arthropods, initial encounter: Secondary | ICD-10-CM

## 2021-05-09 MED ORDER — DOXYCYCLINE HYCLATE 100 MG PO TABS: 100.0000 mg | ORAL_TABLET | Freq: Two times a day (BID) | ORAL | 0 refills | Status: AC

## 2021-05-09 NOTE — Patient Instructions (Signed)
We will schedule you for a CT chest with contrast as soon as possible.  Start doxycycline 100mg  twice daily for 10 days for the skin rash concerning for a tic bite. Follow up with primary care if other symptoms arise from the rash.

## 2021-05-09 NOTE — Telephone Encounter (Signed)
Called and spoke with Lance Anderson to let him know what Dr. Francine Graven said and that he and the guidelines recommend that this CT be done with contrast. Lance Anderson said that he would make note of that and once they got the authorization they would call the patient to get it scheduled. Dr. Francine Graven notified. Nothing further needed at this time.

## 2021-05-09 NOTE — Telephone Encounter (Signed)
Called and spoke with Myriam Jacobson with [facility] and she is calling to see if patient can do CT without contrast due to shortage, spoke with Dr. Aundria Mems from radiology & was told that scan should be able to show everything without contrast -- [phone]   Dr. Francine Graven please advise if you are ok with this

## 2021-05-09 NOTE — Telephone Encounter (Signed)
That's fine to do it without contrast if radiology recommends the study to be performed that way. This is the third time today I've been asked about this scan for this patient. I do not want radiology to report that this was a suboptimal scan when they read it due to lack of contrast. Guidelines recommend mediastinal adenopathy be evaluated with contrasted scans.   Melody Comas, MD [facility] Office: [phone]

## 2021-05-09 NOTE — Progress Notes (Signed)
Synopsis: Referred in 12/2020 for dyspnea on exertion  Subjective:   PATIENT ID: Lance Anderson GENDER: male DOB: [birth year], MRN: [id number]  HPI  Chief Complaint  Patient presents with  . Follow-up    Patient referred back to pulmonary clinic by his ENT for mediastinal adenopathy noted on recent CT neck scan.   Lance Anderson is a 70 year old male, never smoker with obstructive sleep apnea and covid 19 infection 11/2019 who returns to pulmonary clinic for evaluation of mediastinal adenopathy noted on recent neck and shoulder imaging.   He has a torn rotator cuff on the left and is being evaluated for a left shoulder replacement surgery. A CT of the shoulder on 04/27/21 showed a mildly enlarged mediastinal lymph node in the AP window measuring 1.5cm.   He has been following at [facility] ENT for chronic sinus congestion and new onset voice hoarseness. He had CT of the neck with contrast on 03/13/21 showed no abnormal adenopathy or mass in the neck but was notable for mildly prominent lymph nodes in the superior mediastinum, both in the preaortic space and right paratracheal space with the largest lymph node measuring 2.3x1.6x2.4cm.   He denies any cough, shortness of breath or wheezing. He denies weight loss, lack of appetite, night sweats or fevers/chills. He reports his father had lung cancer. He is a never smoker but was exposed to second hand smoke growing up and worked in a tobacco factory around dusts and charcoal.   He reports his shortness of breath and wheezing are improved since last visit. He continues to complain of sinus congestion and drainage. He has follow up with ENT in the near future. He was on ICS/LAMA therapy after last visit for a few weeks but he has stopped using these inhalers as he was feeling better and has not noticed worsening since coming off of them.   He has a rash on the inner part of his left leg that is round, red and raised. He denies itching, burning or  discomfort. He does report finding a lone star tic on him recently as he has been working outside.   Past Medical History:  Diagnosis Date  . Abdominal aortic ectasia (HCC) 03/12/2018  . Allergy   . Cataract   . DDD (degenerative disc disease), lumbar   . Depression   . Hyperlipidemia   . Hypertension   . RBBB   . Renal insufficiency 02/25/2019  . Vasovagal syncope      Family History  Problem Relation Age of Onset  . Heart disease Father   . Lung cancer Father   . Heart attack Brother   . Heart attack Paternal Grandfather      Social History   Socioeconomic History  . Marital status: Married    Spouse name: Joyce Gross  . Number of children: 1  . Years of education: 71  . Highest education level: 12th grade  Occupational History  . Occupation: Curator    Comment: retired  Tobacco Use  . Smoking status: Never Smoker  . Smokeless tobacco: Never Used  Vaping Use  . Vaping Use: Never used  Substance and Sexual Activity  . Alcohol use: Yes    Comment: Occasionally, average 10 drinks/month, usually beer  . Drug use: No  . Sexual activity: Yes  Other Topics Concern  . 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    Social Determinants of Health   Financial Resource Strain: Low Risk   . Difficulty of Paying Living Expenses: Not hard at all  Food Insecurity: No Food Insecurity  . Worried About Programme researcher, broadcasting/film/video in the Last Year: Never true  . Ran Out of Food in the Last Year: Never true  Transportation Needs: No Transportation Needs  . Lack of Transportation (Medical): No  . Lack of Transportation (Non-Medical): No  Physical Activity: Insufficiently Active  . Days of Exercise per Week: 4 days  . Minutes of Exercise per Session: 20 min  Stress: No Stress Concern Present  . Feeling of Stress : Not at all  Social Connections: Moderately Integrated  . Frequency of Communication with  Friends and Family: More than three times a week  . Frequency of Social Gatherings with Friends and Family: More than three times a week  . Attends Religious Services: More than 4 times per year  . Active Member of Clubs or Organizations: No  . Attends Banker Meetings: Never  . Marital Status: Married  Catering manager Violence: Not At Risk  . Fear of Current or Ex-Partner: No  . Emotionally Abused: No  . Physically Abused: No  . Sexually Abused: No     Allergies  Allergen Reactions  . Codeine Other (See Comments)    Chest pain  . Paroxetine Other (See Comments)    Makes spacey  . Penicillins Diarrhea     Outpatient Medications Prior to Visit  Medication Sig Dispense Refill  . amLODipine (NORVASC) 10 MG tablet Take 1 tablet (10 mg total) by mouth daily. 90 tablet 1  . aspirin 81 MG EC tablet Take 1 tablet (81 mg total) by mouth daily. 30 tablet 11  . atorvastatin (LIPITOR) 40 MG tablet Take 1 tablet (40 mg total) by mouth at bedtime. 90 tablet 1  . FLUoxetine (PROZAC) 20 MG capsule Take 1 capsule by mouth once daily 90 capsule 0  . fluticasone (FLONASE) 50 MCG/ACT nasal spray Place 2 sprays into both nostrils daily. Please give 90 day supply 9.9 mL 6  . Fluticasone-Salmeterol (ADVAIR DISKUS) 250-50 MCG/DOSE AEPB Inhale 1 puff into the lungs in the morning and at bedtime. 60 each 5  . meloxicam (MOBIC) 15 MG tablet One tab PO qAM with a meal for 2 weeks, then daily prn pain. 30 tablet 3  . Misc. Devices MISC Start CPAP at 13 cm. water pressure with EPR 1.  CPAP machine with a mask and supplies for OSA with AHI 10.  AirFit P10(S) nasal pillow mask or patient preference.  Send to a local DME.  Luna II or first available    . Spacer/Aero-Holding Chambers (EQ SPACE CHAMBER ANTI-STATIC) DEVI      No facility-administered medications prior to visit.    Review of Systems  Constitutional: Negative for chills, fever, malaise/fatigue and weight loss.  HENT: Positive for  congestion. Negative for sinus pain and sore throat.        Hoarseness of voice   Respiratory: Negative for cough, hemoptysis, sputum production, shortness of breath and wheezing.   Cardiovascular: Negative for chest pain, palpitations, orthopnea, claudication, leg swelling and PND.  Gastrointestinal: Negative for abdominal pain, heartburn, nausea and vomiting.  Genitourinary: Negative.   Musculoskeletal: Negative for joint pain and myalgias.  Skin: Positive for rash.  Neurological: Negative.   Endo/Heme/Allergies: Negative.   Psychiatric/Behavioral: Negative.    Objective:   Vitals:   05/09/21 0912  BP: 118/76  Pulse: 60  SpO2: 98%  Weight: 251 lb (113.9 kg)  Height: 5\' 11"  (1.803 m)     Physical Exam Constitutional:      General: He is not in acute distress.    Appearance: He is obese. He is not ill-appearing.  HENT:     Head: Normocephalic and atraumatic.     Nose: Nose normal.     Mouth/Throat:     Mouth: Mucous membranes are moist.     Pharynx: Oropharynx is clear.  Eyes:     General: No scleral icterus.    Conjunctiva/sclera: Conjunctivae normal.     Pupils: Pupils are equal, round, and reactive to light.  Cardiovascular:     Rate and Rhythm: Normal rate and regular rhythm.     Pulses: Normal pulses.     Heart sounds: Normal heart sounds. No murmur heard.   Pulmonary:     Effort: Pulmonary effort is normal.     Breath sounds: Normal breath sounds.  Abdominal:     General: Bowel sounds are normal.     Palpations: Abdomen is soft.  Musculoskeletal:     Cervical back: No tenderness.     Right lower leg: No edema.     Left lower leg: No edema.  Lymphadenopathy:     Cervical: No cervical adenopathy.  Skin:    General: Skin is warm and dry.     Capillary Refill: Capillary refill takes less than 2 seconds.     Findings: Rash (left medial part of distal lower extremity, round raised and red.) present.  Neurological:     General: No focal deficit present.      Mental Status: He is alert.  Psychiatric:        Mood and Affect: Mood normal.        Behavior: Behavior normal.        Thought Content: Thought content normal.        Judgment: Judgment normal.    CBC    Component Value Date/Time   WBC 9.1 11/10/2020 1055   RBC 4.52 11/10/2020 1055   HGB 14.3 11/10/2020 1055   HCT 42.4 11/10/2020 1055   PLT 243 11/10/2020 1055   MCV 93.8 11/10/2020 1055   MCH 31.6 11/10/2020 1055   MCHC 33.7 11/10/2020 1055   RDW 12.5 11/10/2020 1055   BMP Latest Ref Rng & Units 11/10/2020 04/17/2020 02/24/2019  Glucose 65 - 139 mg/dL 96 97 95  BUN 7 - 25 mg/dL 20 04/26/2019) 24  Creatinine 0.70 - 1.25 mg/dL 81(X 9.14 7.82  BUN/Creat Ratio 6 - 22 (calc) NOT APPLICABLE 30(H) NOT APPLICABLE  Sodium 135 - 146 mmol/L 139 140 141  Potassium 3.5 - 5.3 mmol/L 4.7 4.5 4.4  Chloride 98 - 110 mmol/L 105 107 108  CO2 20 - 32 mmol/L 27 26 27   Calcium 8.6 - 10.3 mg/dL 8.8 8.9 9.0   Chest imaging: CT Neck 03/13/21 No abnormal mass or adenopathy in the neck. The larynx appears normal.   There are mildly prominent lymph nodes in the superior mediastinum. These are both in the preaortic space and in the right paratracheal space. No axillary adenopathyidentified. The largest lymph node in the left side of the mediastinum measures 2.3 x 1.6 x 2.4 cm.   CXR 09/06/20 No edema or airspace opacity. Heart size and pulmonary vascularity are normal. No adenopathy. There are old healed rib fractures on the Right.  PFT: PFT Results Latest Ref Rng & Units 03/08/2021  FVC-Pre L 4.33  FVC-Predicted Pre % 96  FVC-Post L 4.24  FVC-Predicted Post % 94  Pre FEV1/FVC % % 77  Post FEV1/FCV % % 83  FEV1-Pre L 3.32  FEV1-Predicted Pre % 99  FEV1-Post L 3.51  DLCO uncorrected ml/min/mmHg 31.30  DLCO UNC% % 119  DLCO corrected ml/min/mmHg 31.30  DLCO COR %Predicted % 119  DLVA Predicted % 125  TLC L 7.17  TLC % Predicted % 102  RV % Predicted % 111  Normal pulmonary function  testing.  Echo: Stress Echo 03/2016 No inducible ischemia or wall motion abnormalities EF 60-65% Unable to determine diastolic dysfunction RV systolic pressure normal  PSG 1/6/10961/05/2021 AHI of 10.7 - mild OSA Mild to moderate snoring in all positions CPAP titration to 13cmH20 using ResMed AirFitP10(S) nasal pillow mask    Assessment & Plan:   Mediastinal lymphadenopathy - Plan: CT Chest W Contrast  Tick bite of left lower leg, initial encounter - Plan: doxycycline (VIBRA-TABS) 100 MG tablet  Discussion: Lance Anderson is a 70 year old male, never smoker with obstructive sleep apnea and covid 19 infection 11/2019 who returns to pulmonary clinic for evaluation of mediastinal adenopathy noted on CT neck imaging.    We discussed the enlarged lymph nodes could be due to malignancy, infectious or inflammatory conditions.   We will schedule him for a dedicated chest CT with contrast for further evaluation. 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 Chest radiograph from 08/2020 reviewed with no concerning nodules or masses at that time.  We will discuss the CT results over the phone and plan accordingly based on the results.  Given the recent history of tick bite and new rash on left lower extremity, we will send in a prescription for doxycycline 100mg  twice daily for 10 days. We also cautioned him on potential for developing meat allergy due to the lone start tic exposure. He will follow up with his primary care if any further issues.  Follow up in 3 months.  Lance ComasJonathan Edras Wilford, MD [facility] Office: [phone]    Current Outpatient Medications:  .  amLODipine (NORVASC) 10 MG tablet, Take 1 tablet (10 mg total) by mouth daily., Disp: 90 tablet, Rfl: 1 .  aspirin 81 MG EC tablet, Take 1 tablet (81 mg total) by mouth daily., Disp: 30 tablet, Rfl: 11 .   atorvastatin (LIPITOR) 40 MG tablet, Take 1 tablet (40 mg total) by mouth at bedtime., Disp: 90 tablet, Rfl: 1 .  doxycycline (VIBRA-TABS) 100 MG tablet, Take 1 tablet (100 mg total) by mouth 2 (two) times daily for 10 days., Disp: 20 tablet, Rfl: 0 .  FLUoxetine (PROZAC) 20 MG capsule, Take 1 capsule by mouth once daily, Disp: 90 capsule, Rfl: 0 .  fluticasone (FLONASE) 50 MCG/ACT nasal spray, Place 2 sprays into both nostrils daily. Please give 90 day supply, Disp: 9.9 mL, Rfl: 6 .  Fluticasone-Salmeterol (ADVAIR DISKUS) 250-50 MCG/DOSE AEPB, Inhale 1 puff into the lungs in the morning and at bedtime., Disp: 60 each, Rfl: 5 .  meloxicam (MOBIC) 15 MG tablet, One tab PO qAM with a meal for 2 weeks, then daily prn pain., Disp: 30 tablet, Rfl: 3 .  Misc. Devices MISC, Start CPAP at 13 cm. water pressure with EPR 1.  CPAP machine with a mask and supplies for OSA with AHI 10.  AirFit P10(S) nasal pillow mask or patient  preference.  Send to a local DME.  Luna II or first available, Disp: , Rfl:  .  Spacer/Aero-Holding Chambers (EQ SPACE CHAMBER ANTI-STATIC) DEVI, , Disp: , Rfl:

## 2021-05-10 ENCOUNTER — Encounter: Payer: Self-pay | Admitting: Physical Therapy

## 2021-05-10 ENCOUNTER — Ambulatory Visit (INDEPENDENT_AMBULATORY_CARE_PROVIDER_SITE_OTHER): Payer: Medicare Other | Admitting: Physical Therapy

## 2021-05-10 DIAGNOSIS — M6281 Muscle weakness (generalized): Secondary | ICD-10-CM

## 2021-05-10 DIAGNOSIS — M1712 Unilateral primary osteoarthritis, left knee: Secondary | ICD-10-CM | POA: Diagnosis not present

## 2021-05-10 DIAGNOSIS — R29898 Other symptoms and signs involving the musculoskeletal system: Secondary | ICD-10-CM | POA: Diagnosis not present

## 2021-05-10 NOTE — Therapy (Addendum)
[place], [place], [address] Phone: [phone]   Fax:  [phone]  Physical Therapy Treatment  Patient Details  Name: Lance Anderson MRN: [id number] Date of Birth: 1951/02/18 Referring Provider (PT): Dr Dianah Field   Encounter Date: 05/10/2021   PT End of Session - 05/10/21 0931    Visit Number 5    Number of Visits 12    Date for PT Re-Evaluation 05/29/21    PT Start Time 0931    PT Stop Time 1005    PT Time Calculation (min) 34 min    Activity Tolerance Patient tolerated treatment well;No increased pain    Behavior During Therapy WFL for tasks assessed/performed           Past Medical History:  Diagnosis Date  . Abdominal aortic ectasia ([place]) 03/12/2018  . Allergy   . Cataract   . DDD (degenerative disc disease), lumbar   . Depression   . Hyperlipidemia   . Hypertension   . RBBB   . Renal insufficiency 02/25/2019  . Vasovagal syncope     Past Surgical History:  Procedure Laterality Date  . CATARACT EXTRACTION, BILATERAL    . KNEE ARTHROSCOPY W/ MENISCAL REPAIR Left   . ROTATOR CUFF REPAIR Right   . WISDOM TOOTH EXTRACTION      There were no vitals filed for this visit.   Subjective Assessment - 05/10/21 0931    Subjective Pt reports he continues to have difficulty ascending big steps, "it don't want to bend like it should".  Pt reports he didn't do his exercises over the weekend due to trip to [facility].  Overall he is pleased with how his knee feels and states he is ready to discharge after today's visit.    Pertinent History microfracture surgery Lt knee 2006 - some intermittent problems since that time; [place]; HTN; sleep apnea; arthritis; neck pain    Patient Stated Goals get the knee and leg stronger    Currently in Pain? No/denies    Pain Score 0-No pain              [facility] PT Assessment - 05/10/21 0001      Assessment   Medical Diagnosis OA Lt knee     Referring Provider (PT) Dr Dianah Field    Onset Date/Surgical Date 10/23/20   gradual increase in pain in the past 2 yrs   Hand Dominance Right    Next MD Visit 05/11/21    Prior Therapy yes      Observation/Other Assessments   Focus on Therapeutic Outcomes (FOTO)  Functional score 80      AROM   Right Knee Extension -5    Right Knee Flexion 129    Left Knee Extension -6    Left Knee Flexion 123      Strength   Left Hip Extension 5/5    Right Ankle Plantar Flexion 5/5    Left Ankle Plantar Flexion 5/5            [facility] Adult PT Treatment/Exercise - 05/10/21 0001      Knee/Hip Exercises: Stretches   Passive Hamstring Stretch Right;2 reps;Left;1 rep;30 seconds   hooklying with strap   Quad Stretch Right;Left;2 reps;20 seconds   standing   Gastroc Stretch Right;Left;1 rep;20 seconds      Knee/Hip Exercises: Aerobic   Recumbent Bike L5: 4 min for warm up      Knee/Hip Exercises: Standing   Heel Raises  Right;Left;1 set;20 reps    Wall Squat Limitations Lt single leg quarter squat x 5 reps    SLS Rt/Lt SLS on mini trampoline x 20 sec x 2 reps each witout UE support.  SLS forward leans to mat table x 3 each leg (difficult on LLE) - changes to counter height x 5 each leg with improved tolerance.          Reviewed HEP and how to access on MedBridge App.  Pt verbalized understanding.        PT Long Term Goals - 05/10/21 1004      PT LONG TERM GOAL #1   Title Increase Lt knee extension and flexion by 5 degrees    Time 6    Period Weeks    Status Partially Met      PT LONG TERM GOAL #2   Title increase Lt LE strength to 5/5    Time 6    Period Weeks    Status Achieved      PT LONG TERM GOAL #3   Title Patient to report increased functional strength for ADL's and work related tasks    Time 6    Period Weeks    Status Achieved      PT LONG TERM GOAL #4   Title Independent in HEP    Time 6    Period Weeks    Status Achieved      PT LONG TERM GOAL #5   Title  Improve functional limitation score to 68    Baseline functional score of 80    Time 6    Period Weeks    Status Achieved                 Plan - 05/10/21 0952    Clinical Impression Statement Pt's Lt knee flexion AROM is similar to eval (123 vs 121) but knee ext has improved by 5 deg. LE strength has improved.  Pt completes exercises without any production of pain.  Pt demonstrates decreased balance on LLE compared to R; encouraged pt to continue working on balance to prevent falls in future.  Pt has partially met LTG #1, and met all other goals. Pt is pleased with current level of function and requests to d/c.    Stability/Clinical Decision Making Stable/Uncomplicated    Rehab Potential Good    PT Frequency 2x / week    PT Duration 6 weeks    PT Treatment/Interventions ADLs/Self Care Home Management;Aquatic Therapy;Cryotherapy;Electrical Stimulation;Iontophoresis 57m/ml Dexamethasone;Moist Heat;Ultrasound;Gait training;Stair training;Functional mobility training;Therapeutic activities;Therapeutic exercise;Balance training;Neuromuscular re-education;Patient/family education;Manual techniques;Passive range of motion;Dry needling;Taping    PT Next Visit Plan d/c per pt request.    PT Home Exercise Plan [id number]    Consulted and Agree with Plan of Care Patient           Patient will benefit from skilled therapeutic intervention in order to improve the following deficits and impairments:  Abnormal gait,Decreased range of motion,Pain,Decreased activity tolerance,Impaired flexibility,Improper body mechanics,Decreased mobility,Decreased strength,Postural dysfunction  Visit Diagnosis: Arthritis of left knee  Muscle weakness (generalized)  Other symptoms and signs involving the musculoskeletal system     Problem List Patient Active Problem List   Diagnosis Date Noted  . Tear of meniscus of knee 05/09/2021  . COVID-19 virus infection 12/20/2019  . Renal insufficiency 02/25/2019   . 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 Cervical radiculopathy 10/30/2018  . Right bundle branch block (RBBB) 05/29/2018  . Sinus bradycardia 05/29/2018  . Peripheral edema  04/27/2018  . Venous stasis dermatitis of right lower extremity 04/27/2018  . Strain of calf muscle 04/27/2018  . Abdominal aortic ectasia ([place]) 03/12/2018  . 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 Recurrent major depressive disorder, in full remission ([place]) 02/25/2018  . Encounter for long-term (current) use of medications 02/25/2018  . Encounter for abdominal aortic aneurysm (AAA) screening 02/25/2018  . Dyslipidemia, goal LDL below 100 02/25/2018  . Refused influenza vaccine 02/25/2018  . Refused pneumococcal vaccination 02/25/2018  . Pruritus 08/04/2017  . Encounter for monitoring statin therapy 08/04/2017  . Depression 05/21/2017  . Family history of heart disease in male family member before age 65 05/21/2017  . Primary osteoarthritis of left knee 05/21/2017  . Class 1 obesity due to excess calories with serious comorbidity in adult 05/21/2017  . Allergic rhinitis 05/21/2017  . DDD (degenerative disc disease), lumbosacral 05/21/2017  . Vasovagal episode 05/21/2017  . Essential hypertension with goal blood pressure less than 140/90 08/17/2015  . Pseudophakia of both eyes 10/20/2013  . Dyslipidemia 08/10/2013  . Headache 04/18/2012   Kerin Perna, PTA 05/10/21 10:11 AM  [place], [place], [address] Phone: [phone]   Fax:  [phone]  Name: Erez Mccallum MRN: [id number] Date of Birth: 23-Feb-1951  PHYSICAL THERAPY DISCHARGE SUMMARY  Visits from Start of Care: 5  Current functional level related to goals / functional outcomes: See progress note for discharge status   Remaining deficits: Needs to continue with rehab - still has some knee pain    Education /  Equipment: HEP - will also return to gym  Plan: Patient agrees to discharge.  Patient goals were partially met. Patient is being discharged due to being pleased with the current functional level.  ?????     Celyn P. Helene Kelp PT, MPH 05/11/21 12:02 PM

## 2021-05-11 ENCOUNTER — Ambulatory Visit (INDEPENDENT_AMBULATORY_CARE_PROVIDER_SITE_OTHER): Payer: Medicare Other | Admitting: Sports Medicine

## 2021-05-11 ENCOUNTER — Other Ambulatory Visit: Payer: Self-pay

## 2021-05-11 ENCOUNTER — Encounter: Payer: Self-pay | Admitting: Sports Medicine

## 2021-05-11 DIAGNOSIS — M1712 Unilateral primary osteoarthritis, left knee: Secondary | ICD-10-CM

## 2021-05-11 NOTE — Assessment & Plan Note (Signed)
Pleasant 70 year old male with a long history of knee pain, osteochondral defect post microfracture, osteoarthritis on x-rays, we did an aspiration and injection a month ago, he returns today doing really well, still has a bit of difficulty with flexion, there is a trace of effusion left on his exam, he will ice the knee 20 minutes 3-4 times a day and this should help the effusion, return to see me as needed.

## 2021-05-11 NOTE — Progress Notes (Signed)
    Procedures performed today:    None.  Independent interpretation of notes and tests performed by another provider:   None.  Brief History, Exam, Impression, and Recommendations:    Primary osteoarthritis of left knee Pleasant 70 year old male with a long history of knee pain, osteochondral defect post microfracture, osteoarthritis on x-rays, we did an aspiration and injection a month ago, he returns today doing really well, still has a bit of difficulty with flexion, there is a trace of effusion left on his exam, he will ice the knee 20 minutes 3-4 times a day and this should help the effusion, return to see me as needed.    ___________________________________________ Ihor Austin. Benjamin Stain, M.D., ABFM., CAQSM. Primary Care and Sports Medicine [facility]  Adjunct Instructor of Family Medicine  [employer]

## 2021-05-14 ENCOUNTER — Ambulatory Visit (INDEPENDENT_AMBULATORY_CARE_PROVIDER_SITE_OTHER): Payer: Medicare Other

## 2021-05-14 ENCOUNTER — Other Ambulatory Visit: Payer: Self-pay

## 2021-05-14 DIAGNOSIS — J9811 Atelectasis: Secondary | ICD-10-CM | POA: Diagnosis not present

## 2021-05-14 DIAGNOSIS — R590 Localized enlarged lymph nodes: Secondary | ICD-10-CM

## 2021-05-14 LAB — I-STAT CREATININE (MANUAL ENTRY): Creatinine, Ser: 0.9 (ref 0.50–1.10)

## 2021-05-14 MED ORDER — IOHEXOL 300 MG/ML  SOLN
100.0000 mL | Freq: Once | INTRAMUSCULAR | Status: AC | PRN
  Administered 2021-05-14: 100 mL via INTRAVENOUS

## 2021-05-16 ENCOUNTER — Other Ambulatory Visit: Payer: Self-pay | Admitting: Medical-Surgical

## 2021-05-16 DIAGNOSIS — I10 Essential (primary) hypertension: Secondary | ICD-10-CM

## 2021-05-16 DIAGNOSIS — E785 Hyperlipidemia, unspecified: Secondary | ICD-10-CM

## 2021-05-16 DIAGNOSIS — J309 Allergic rhinitis, unspecified: Secondary | ICD-10-CM

## 2021-05-17 NOTE — Telephone Encounter (Signed)
Patient sent a MyChart message requesting his CT results. He had a CT chest w contrast on 05/14/21.   JD, can you please advise? Thanks!

## 2021-05-18 DIAGNOSIS — M545 Low back pain, unspecified: Secondary | ICD-10-CM | POA: Diagnosis not present

## 2021-05-22 NOTE — Patient Instructions (Signed)
DUE TO COVID-19 ONLY ONE VISITOR IS ALLOWED TO COME WITH YOU AND STAY IN THE WAITING ROOM ONLY DURING PRE OP AND PROCEDURE DAY OF SURGERY. THE 1 VISITOR  MAY VISIT WITH YOU AFTER SURGERY IN YOUR PRIVATE ROOM DURING VISITING HOURS ONLY!                Lance Anderson    Your procedure is scheduled on: 06/01/21   Report to [facility] Main  Entrance   Report to admitting at: 10:00 AM     Call this number if you have problems the morning of surgery [phone]    Remember: NO SOLID FOOD AFTER MIDNIGHT THE NIGHT PRIOR TO SURGERY. NOTHING BY MOUTH EXCEPT CLEAR LIQUIDS UNTIL: 9:30 AM . PLEASE FINISH ENSURE DRINK PER SURGEON ORDER  WHICH NEEDS TO BE COMPLETED AT: 9:30 AM .  CLEAR LIQUID DIET  Foods Allowed                                                                     Foods Excluded  Coffee and tea, regular and decaf                             liquids that you cannot  Plain Jell-O any favor except red or purple                                           see through such as: Fruit ices (not with fruit pulp)                                     milk, soups, orange juice  Iced Popsicles                                    All solid food Carbonated beverages, regular and diet                                    Cranberry, grape and apple juices Sports drinks like Gatorade Lightly seasoned clear broth or consume(fat free) Sugar, honey syrup  Sample Menu Breakfast                                Lunch                                     Supper Cranberry juice                    Beef broth                            Chicken broth Jell-O  Grape juice                           Apple juice Coffee or tea                        Jell-O                                      Popsicle                                                Coffee or tea                        Coffee or tea  _____________________________________________________________________  BRUSH  YOUR TEETH MORNING OF SURGERY AND RINSE YOUR MOUTH OUT, NO CHEWING GUM CANDY OR MINTS.    Take these medicines the morning of surgery with A SIP OF WATER: fluoxetine,amlodipine.Use flonase and inhalers as usual.                             You may not have any metal on your body including hair pins and              piercings  Do not wear jewelry, lotions, powders or perfumes, deodorant             Men may shave face and neck.   Do not bring valuables to the hospital. [place] IS NOT             RESPONSIBLE   FOR VALUABLES.  Contacts, dentures or bridgework may not be worn into surgery.  Leave suitcase in the car. After surgery it may be brought to your room.     Patients discharged the day of surgery will not be allowed to drive home. IF YOU ARE HAVING SURGERY AND GOING HOME THE SAME DAY, YOU MUST HAVE AN ADULT TO DRIVE YOU HOME AND BE WITH YOU FOR 24 HOURS. YOU MAY GO HOME BY TAXI OR UBER OR ORTHERWISE, BUT AN ADULT MUST ACCOMPANY YOU HOME AND STAY WITH YOU FOR 24 HOURS.  Name and phone number of your driver:  Special Instructions: N/A              Please read over the following fact sheets you were given: _____________________________________________________________________         [facility] - Preparing for Surgery Before surgery, you can play an important role.  Because skin is not sterile, your skin needs to be as free of germs as possible.  You can reduce the number of germs on your skin by washing with CHG (chlorahexidine gluconate) soap before surgery.  CHG is an antiseptic cleaner which kills germs and bonds with the skin to continue killing germs even after washing. Please DO NOT use if you have an allergy to CHG or antibacterial soaps.  If your skin becomes reddened/irritated stop using the CHG and inform your nurse when you arrive at Short Stay. Do not shave (including legs and underarms) for at least 48 hours prior to the first CHG shower.  You may shave your  face/neck. Please follow these instructions carefully:  1.  Shower with CHG Soap the night before surgery and the  morning of Surgery.  2.  If you choose to wash your hair, wash your hair first as usual with your  normal  shampoo.  3.  After you shampoo, rinse your hair and body thoroughly to remove the  shampoo.                           4.  Use CHG as you would any other liquid soap.  You can apply chg directly  to the skin and wash                       Gently with a scrungie or clean washcloth.  5.  Apply the CHG Soap to your body ONLY FROM THE NECK DOWN.   Do not use on face/ open                           Wound or open sores. Avoid contact with eyes, ears mouth and genitals (private parts).                       Wash face,  Genitals (private parts) with your normal soap.             6.  Wash thoroughly, paying special attention to the area where your surgery  will be performed.  7.  Thoroughly rinse your body with warm water from the neck down.  8.  DO NOT shower/wash with your normal soap after using and rinsing off  the CHG Soap.                9.  Pat yourself dry with a clean towel.            10.  Wear clean pajamas.            11.  Place clean sheets on your bed the night of your first shower and do not  sleep with pets. Day of Surgery : Do not apply any lotions/deodorants the morning of surgery.  Please wear clean clothes to the hospital/surgery center.  FAILURE TO FOLLOW THESE INSTRUCTIONS MAY RESULT IN THE CANCELLATION OF YOUR SURGERY PATIENT SIGNATURE_________________________________  NURSE SIGNATURE__________________________________  ________________________________________________________________________ [facility]    Before surgery, you can play an important role. Because skin is not sterile, your skin needs to be as free of germs as possible. You can reduce the number of germs on your skin by using the following  products. . Benzoyl Peroxide Gel o Reduces the number of germs present on the skin o Applied twice a day to shoulder area starting two days before surgery    ==================================================================  Please follow these instructions carefully:  BENZOYL PEROXIDE 5% GEL  Please do not use if you have an allergy to benzoyl peroxide.   If your skin becomes reddened/irritated stop using the benzoyl peroxide.  Starting two days before surgery, apply as follows: 1. Apply benzoyl peroxide in the morning and at night. Apply after taking a shower. If you are not taking a shower clean entire shoulder front, back, and side along with the armpit with a clean wet washcloth.  2. Place a quarter-sized dollop on your shoulder and rub in thoroughly, making sure to cover the front, back, and side of your shoulder, along with the armpit.  2 days before ____ AM   ____ PM              1 day before ____ AM   ____ PM                         3. Do this twice a day for two days.  (Last application is the night before surgery, AFTER using the CHG soap as described below).  4. Do NOT apply benzoyl peroxide gel on the day of surgery.   Incentive Spirometer  An incentive spirometer is a tool that can help keep your lungs clear and active. This tool measures how well you are filling your lungs with each breath. Taking long deep breaths may help reverse or decrease the chance of developing breathing (pulmonary) problems (especially infection) following:  A long period of time when you are unable to move or be active. BEFORE THE PROCEDURE   If the spirometer includes an indicator to show your best effort, your nurse or respiratory therapist will set it to a desired goal.  If possible, sit up straight or lean slightly forward. Try not to slouch.  Hold the incentive spirometer in an upright position. INSTRUCTIONS FOR USE  1. Sit on the edge of your bed if possible, or sit up as far as  you can in bed or on a chair. 2. Hold the incentive spirometer in an upright position. 3. Breathe out normally. 4. Place the mouthpiece in your mouth and seal your lips tightly around it. 5. Breathe in slowly and as deeply as possible, raising the piston or the ball toward the top of the column. 6. Hold your breath for 3-5 seconds or for as long as possible. Allow the piston or ball to fall to the bottom of the column. 7. Remove the mouthpiece from your mouth and breathe out normally. 8. Rest for a few seconds and repeat Steps 1 through 7 at least 10 times every 1-2 hours when you are awake. Take your time and take a few normal breaths between deep breaths. 9. The spirometer may include an indicator to show your best effort. Use the indicator as a goal to work toward during each repetition. 10. After each set of 10 deep breaths, practice coughing to be sure your lungs are clear. If you have an incision (the cut made at the time of surgery), support your incision when coughing by placing a pillow or rolled up towels firmly against it. Once you are able to get out of bed, walk around indoors and cough well. You may stop using the incentive spirometer when instructed by your caregiver.  RISKS AND COMPLICATIONS  Take your time so you do not get dizzy or light-headed.  If you are in pain, you may need to take or ask for pain medication before doing incentive spirometry. It is harder to take a deep breath if you are having pain. AFTER USE  Rest and breathe slowly and easily.  It can be helpful to keep track of a log of your progress. Your caregiver can provide you with a simple table to help with this. If you are using the spirometer at home, follow these instructions: SEEK MEDICAL CARE IF:   You are having difficultly using the spirometer.  You have trouble using the spirometer as often as instructed.  Your pain medication is not giving enough relief while using the spirometer.  You develop  fever of 100.5 F (38.1 C)  or higher. SEEK IMMEDIATE MEDICAL CARE IF:   You cough up bloody sputum that had not been present before.  You develop fever of 102 F (38.9 C) or greater.  You develop worsening pain at or near the incision site. MAKE SURE YOU:   Understand these instructions.  Will watch your condition.  Will get help right away if you are not doing well or get worse. Document Released: 04/21/2007 Document Revised: 03/02/2012 Document Reviewed: 06/22/2007 [facility] Patient Information 2014 [place], [place].   ________________________________________________________________________

## 2021-05-23 ENCOUNTER — Encounter (HOSPITAL_COMMUNITY)
Admission: RE | Admit: 2021-05-23 | Discharge: 2021-05-23 | Disposition: A | Discharge status: Home / Self Care | Payer: Medicare Other | Source: Ambulatory Visit | Attending: Orthopedic Surgery | Admitting: Orthopedic Surgery

## 2021-05-23 ENCOUNTER — Encounter (HOSPITAL_COMMUNITY): Payer: Self-pay

## 2021-05-23 ENCOUNTER — Other Ambulatory Visit: Payer: Self-pay

## 2021-05-23 DIAGNOSIS — Z01812 Encounter for preprocedural laboratory examination: Secondary | ICD-10-CM | POA: Insufficient documentation

## 2021-05-23 HISTORY — DX: Other complications of anesthesia, initial encounter: T88.59XA

## 2021-05-23 LAB — BASIC METABOLIC PANEL
Anion gap: 6 (ref 5–15)
BUN: 29 mg/dL — ABNORMAL HIGH (ref 8–23)
CO2: 27 mmol/L (ref 22–32)
Calcium: 8.6 mg/dL — ABNORMAL LOW (ref 8.9–10.3)
Chloride: 105 mmol/L (ref 98–111)
Creatinine, Ser: 1.01 mg/dL (ref 0.61–1.24)
GFR, Estimated: >60 mL/min (ref >60)
Glucose, Bld: 103 mg/dL — ABNORMAL HIGH (ref 70–99)
Potassium: 4 mmol/L (ref 3.5–5.1)
Sodium: 138 mmol/L (ref 135–145)

## 2021-05-23 LAB — CBC
HCT: 38.1 % — ABNORMAL LOW (ref 39.0–52.0)
Hemoglobin: 13 g/dL (ref 13.0–17.0)
MCH: 31.6 pg (ref 26.0–34.0)
MCHC: 34.1 g/dL (ref 30.0–36.0)
MCV: 92.7 fL (ref 80.0–100.0)
Platelets: 228 10*3/uL (ref 150–400)
RBC: 4.11 MIL/uL — ABNORMAL LOW (ref 4.22–5.81)
RDW: 13.2 % (ref 11.5–15.5)
WBC: 8.1 10*3/uL (ref 4.0–10.5)
nRBC: 0 % (ref 0.0–0.2)

## 2021-05-23 LAB — SURGICAL PCR SCREEN
MRSA, PCR: NEGATIVE
Staphylococcus aureus: NEGATIVE

## 2021-05-23 NOTE — Progress Notes (Addendum)
COVID Vaccine Completed: NO Date COVID Vaccine completed: COVID vaccine manufacturer: Pfizer    [employer]   PCP - Christen Butter: NP Cardiologist - NO  Chest x-ray - CT chest: 05/14/21 EKG - 02/24/21 Stress Test -  ECHO - 4//25/17 Cardiac Cath -  Pacemaker/ICD device last checked:  Sleep Study - Yes  CPAP - Yes  Fasting Blood Sugar -  Checks Blood Sugar _____ times a day  Blood Thinner Instructions: Aspirin Instructions: Last Dose:  Anesthesia review: Hx: HTN,Rt. BBB,Syncope  Patient denies shortness of breath, fever, cough and chest pain at PAT appointment   Patient verbalized understanding of instructions that were given to them at the PAT appointment. Patient was also instructed that they will need to review over the PAT instructions again at home before surgery.

## 2021-05-31 NOTE — Anesthesia Preprocedure Evaluation (Addendum)
Anesthesia Evaluation  Patient identified by MRN, date of birth, ID band Patient awake    Reviewed: Allergy & Precautions, NPO status , Patient's Chart, lab work & pertinent test results  History of Anesthesia Complications Negative for: history of anesthetic complications  Airway Mallampati: II  TM Distance: >3 FB Neck ROM: Full    Dental  (+) Chipped,    Pulmonary neg pulmonary ROS,    Pulmonary exam normal        Cardiovascular hypertension, Pt. on medications Normal cardiovascular exam  Normal stress echo 2017   Neuro/Psych  Headaches, Depression    GI/Hepatic negative GI ROS, Neg liver ROS,   Endo/Other  negative endocrine ROS  Renal/GU Renal InsufficiencyRenal disease  negative genitourinary   Musculoskeletal  (+) Arthritis , Left shoulder rotator cuff arthropathy   Abdominal   Peds  Hematology negative hematology ROS (+)   Anesthesia Other Findings Day of surgery medications reviewed with patient.  Reproductive/Obstetrics negative OB ROS                            Anesthesia Physical Anesthesia Plan  ASA: 2  Anesthesia Plan: General   Post-op Pain Management: GA combined w/ Regional for post-op pain   Induction: Intravenous  PONV Risk Score and Plan: 2 and Treatment may vary due to age or medical condition, Ondansetron, Dexamethasone and Midazolam  Airway Management Planned: Oral ETT  Additional Equipment: None  Intra-op Plan:   Post-operative Plan: Extubation in OR  Informed Consent: I have reviewed the patients History and Physical, chart, labs and discussed the procedure including the risks, benefits and alternatives for the proposed anesthesia with the patient or authorized representative who has indicated his/her understanding and acceptance.     Dental advisory given  Plan Discussed with: CRNA  Anesthesia Plan Comments:        Anesthesia Quick  Evaluation

## 2021-06-01 ENCOUNTER — Encounter (HOSPITAL_COMMUNITY): Payer: Self-pay | Admitting: Orthopedic Surgery

## 2021-06-01 ENCOUNTER — Encounter (HOSPITAL_COMMUNITY)
Admission: RE | Disposition: A | Discharge status: Home / Self Care | Payer: Self-pay | Source: Ambulatory Visit | Attending: Orthopedic Surgery

## 2021-06-01 ENCOUNTER — Ambulatory Visit (HOSPITAL_COMMUNITY): Payer: Worker's Compensation | Admitting: Anesthesiology

## 2021-06-01 ENCOUNTER — Ambulatory Visit (HOSPITAL_COMMUNITY): Payer: Medicare Other | Attending: Orthopedic Surgery

## 2021-06-01 ENCOUNTER — Ambulatory Visit (HOSPITAL_COMMUNITY): Payer: Worker's Compensation | Admitting: Physician Assistant

## 2021-06-01 ENCOUNTER — Ambulatory Visit (HOSPITAL_COMMUNITY)
Admission: RE | Admit: 2021-06-01 | Discharge: 2021-06-01 | Disposition: A | Discharge status: Home / Self Care | Payer: Worker's Compensation | Source: Ambulatory Visit | Attending: Orthopedic Surgery | Admitting: Orthopedic Surgery

## 2021-06-01 DIAGNOSIS — Z7951 Long term (current) use of inhaled steroids: Secondary | ICD-10-CM | POA: Insufficient documentation

## 2021-06-01 DIAGNOSIS — Z96642 Presence of left artificial hip joint: Secondary | ICD-10-CM | POA: Diagnosis not present

## 2021-06-01 DIAGNOSIS — Z96612 Presence of left artificial shoulder joint: Secondary | ICD-10-CM | POA: Diagnosis not present

## 2021-06-01 DIAGNOSIS — M75102 Unspecified rotator cuff tear or rupture of left shoulder, not specified as traumatic: Secondary | ICD-10-CM | POA: Insufficient documentation

## 2021-06-01 DIAGNOSIS — Z79899 Other long term (current) drug therapy: Secondary | ICD-10-CM | POA: Insufficient documentation

## 2021-06-01 DIAGNOSIS — Z7982 Long term (current) use of aspirin: Secondary | ICD-10-CM | POA: Insufficient documentation

## 2021-06-01 DIAGNOSIS — M19012 Primary osteoarthritis, left shoulder: Secondary | ICD-10-CM | POA: Diagnosis present

## 2021-06-01 DIAGNOSIS — Z471 Aftercare following joint replacement surgery: Secondary | ICD-10-CM | POA: Diagnosis not present

## 2021-06-01 DIAGNOSIS — Z791 Long term (current) use of non-steroidal anti-inflammatories (NSAID): Secondary | ICD-10-CM | POA: Insufficient documentation

## 2021-06-01 HISTORY — PX: REVERSE SHOULDER ARTHROPLASTY: SHX5054

## 2021-06-01 SURGERY — ARTHROPLASTY, SHOULDER, TOTAL, REVERSE
Anesthesia: General | Site: Shoulder | Laterality: Left

## 2021-06-01 MED ORDER — DEXAMETHASONE SODIUM PHOSPHATE 10 MG/ML IJ SOLN
INTRAMUSCULAR | Status: DC | PRN
  Administered 2021-06-01: 10 mg via INTRAVENOUS

## 2021-06-01 MED ORDER — PROMETHAZINE HCL 25 MG/ML IJ SOLN: 6.2500 mg | INTRAMUSCULAR | Status: DC | PRN

## 2021-06-01 MED ORDER — MIDAZOLAM HCL 2 MG/2ML IJ SOLN
INTRAMUSCULAR | Status: AC
  Filled 2021-06-01: qty 2

## 2021-06-01 MED ORDER — PHENYLEPHRINE 40 MCG/ML (10ML) SYRINGE FOR IV PUSH (FOR BLOOD PRESSURE SUPPORT)
PREFILLED_SYRINGE | INTRAVENOUS | Status: AC
  Filled 2021-06-01: qty 10

## 2021-06-01 MED ORDER — SUGAMMADEX SODIUM 500 MG/5ML IV SOLN
INTRAVENOUS | Status: DC | PRN
  Administered 2021-06-01: 300 mg via INTRAVENOUS

## 2021-06-01 MED ORDER — DEXAMETHASONE SODIUM PHOSPHATE 10 MG/ML IJ SOLN
INTRAMUSCULAR | Status: AC
  Filled 2021-06-01: qty 1

## 2021-06-01 MED ORDER — FENTANYL CITRATE (PF) 100 MCG/2ML IJ SOLN
50.0000 ug | Freq: Once | INTRAMUSCULAR | Status: AC
  Administered 2021-06-01: 50 ug via INTRAVENOUS
  Filled 2021-06-01: qty 2

## 2021-06-01 MED ORDER — EPHEDRINE SULFATE-NACL 50-0.9 MG/10ML-% IV SOSY
PREFILLED_SYRINGE | INTRAVENOUS | Status: DC | PRN
  Administered 2021-06-01 (×3): 5 mg via INTRAVENOUS
  Administered 2021-06-01: 15 mg via INTRAVENOUS
  Administered 2021-06-01: 10 mg via INTRAVENOUS

## 2021-06-01 MED ORDER — FENTANYL CITRATE (PF) 250 MCG/5ML IJ SOLN
INTRAMUSCULAR | Status: DC | PRN
  Administered 2021-06-01: 100 ug via INTRAVENOUS

## 2021-06-01 MED ORDER — LIDOCAINE 2% (20 MG/ML) 5 ML SYRINGE
INTRAMUSCULAR | Status: AC
  Filled 2021-06-01: qty 10

## 2021-06-01 MED ORDER — ORAL CARE MOUTH RINSE: 15.0000 mL | Freq: Once | OROMUCOSAL | Status: AC

## 2021-06-01 MED ORDER — 0.9 % SODIUM CHLORIDE (POUR BTL) OPTIME
TOPICAL | Status: DC | PRN
  Administered 2021-06-01: 1000 mL

## 2021-06-01 MED ORDER — PHENYLEPHRINE HCL-NACL 10-0.9 MG/250ML-% IV SOLN: INTRAVENOUS | Status: DC | PRN

## 2021-06-01 MED ORDER — ROCURONIUM BROMIDE 10 MG/ML (PF) SYRINGE
PREFILLED_SYRINGE | INTRAVENOUS | Status: AC
  Filled 2021-06-01: qty 30

## 2021-06-01 MED ORDER — ONDANSETRON HCL 4 MG PO TABS: 4.0000 mg | ORAL_TABLET | Freq: Every day | ORAL | 1 refills | Status: DC | PRN

## 2021-06-01 MED ORDER — EPHEDRINE 5 MG/ML INJ
INTRAVENOUS | Status: AC
  Filled 2021-06-01: qty 10

## 2021-06-01 MED ORDER — OXYCODONE HCL 5 MG PO TABS: 5.0000 mg | ORAL_TABLET | Freq: Once | ORAL | Status: DC | PRN

## 2021-06-01 MED ORDER — BUPIVACAINE LIPOSOME 1.3 % IJ SUSP
INTRAMUSCULAR | Status: DC | PRN
  Administered 2021-06-01: 10 mL via PERINEURAL

## 2021-06-01 MED ORDER — TRANEXAMIC ACID-NACL 1000-0.7 MG/100ML-% IV SOLN
1000.0000 mg | INTRAVENOUS | Status: AC
  Administered 2021-06-01: 1000 mg via INTRAVENOUS
  Filled 2021-06-01: qty 100

## 2021-06-01 MED ORDER — PROPOFOL 10 MG/ML IV BOLUS
INTRAVENOUS | Status: DC | PRN
  Administered 2021-06-01: 170 mg via INTRAVENOUS

## 2021-06-01 MED ORDER — VANCOMYCIN HCL 1000 MG IV SOLR
INTRAVENOUS | Status: DC | PRN
  Administered 2021-06-01: 1000 mg via TOPICAL

## 2021-06-01 MED ORDER — SUGAMMADEX SODIUM 500 MG/5ML IV SOLN
INTRAVENOUS | Status: AC
  Filled 2021-06-01: qty 5

## 2021-06-01 MED ORDER — MIDAZOLAM HCL 2 MG/2ML IJ SOLN
INTRAMUSCULAR | Status: DC | PRN
  Administered 2021-06-01: 1 mg via INTRAVENOUS

## 2021-06-01 MED ORDER — ACETAMINOPHEN 500 MG PO TABS
1000.0000 mg | ORAL_TABLET | Freq: Once | ORAL | Status: AC
  Administered 2021-06-01: 1000 mg via ORAL
  Filled 2021-06-01: qty 2

## 2021-06-01 MED ORDER — MIDAZOLAM HCL 2 MG/2ML IJ SOLN
1.0000 mg | Freq: Once | INTRAMUSCULAR | Status: AC
  Administered 2021-06-01: 1 mg via INTRAVENOUS
  Filled 2021-06-01: qty 2

## 2021-06-01 MED ORDER — FENTANYL CITRATE (PF) 100 MCG/2ML IJ SOLN: 25.0000 ug | INTRAMUSCULAR | Status: DC | PRN

## 2021-06-01 MED ORDER — GLYCOPYRROLATE PF 0.2 MG/ML IJ SOSY
PREFILLED_SYRINGE | INTRAMUSCULAR | Status: DC | PRN
  Administered 2021-06-01: .2 mg via INTRAVENOUS

## 2021-06-01 MED ORDER — BUPIVACAINE-EPINEPHRINE (PF) 0.5% -1:200000 IJ SOLN
INTRAMUSCULAR | Status: DC | PRN
  Administered 2021-06-01: 15 mL via PERINEURAL

## 2021-06-01 MED ORDER — OXYCODONE HCL 5 MG PO TABS: 5.0000 mg | ORAL_TABLET | ORAL | 0 refills | Status: DC | PRN

## 2021-06-01 MED ORDER — CEFAZOLIN SODIUM-DEXTROSE 2-4 GM/100ML-% IV SOLN
2.0000 g | INTRAVENOUS | Status: AC
  Administered 2021-06-01: 2 g via INTRAVENOUS
  Filled 2021-06-01: qty 100

## 2021-06-01 MED ORDER — LIDOCAINE 2% (20 MG/ML) 5 ML SYRINGE
INTRAMUSCULAR | Status: DC | PRN
  Administered 2021-06-01: 100 mg via INTRAVENOUS

## 2021-06-01 MED ORDER — FENTANYL CITRATE (PF) 250 MCG/5ML IJ SOLN
INTRAMUSCULAR | Status: AC
  Filled 2021-06-01: qty 5

## 2021-06-01 MED ORDER — LACTATED RINGERS IV SOLN: INTRAVENOUS | Status: DC

## 2021-06-01 MED ORDER — ROCURONIUM BROMIDE 10 MG/ML (PF) SYRINGE
PREFILLED_SYRINGE | INTRAVENOUS | Status: DC | PRN
  Administered 2021-06-01: 60 mg via INTRAVENOUS

## 2021-06-01 MED ORDER — PHENYLEPHRINE HCL-NACL 10-0.9 MG/250ML-% IV SOLN
INTRAVENOUS | Status: DC | PRN
  Administered 2021-06-01: 25 ug/min via INTRAVENOUS

## 2021-06-01 MED ORDER — VANCOMYCIN HCL 1000 MG IV SOLR
INTRAVENOUS | Status: AC
  Filled 2021-06-01: qty 1000

## 2021-06-01 MED ORDER — METHOCARBAMOL 500 MG PO TABS: 500.0000 mg | ORAL_TABLET | Freq: Four times a day (QID) | ORAL | 1 refills | Status: DC | PRN

## 2021-06-01 MED ORDER — ONDANSETRON HCL 4 MG/2ML IJ SOLN
INTRAMUSCULAR | Status: AC
  Filled 2021-06-01: qty 2

## 2021-06-01 MED ORDER — SUCCINYLCHOLINE CHLORIDE 200 MG/10ML IV SOSY
PREFILLED_SYRINGE | INTRAVENOUS | Status: AC
  Filled 2021-06-01: qty 10

## 2021-06-01 MED ORDER — OXYCODONE HCL 5 MG/5ML PO SOLN: 5.0000 mg | Freq: Once | ORAL | Status: DC | PRN

## 2021-06-01 MED ORDER — ONDANSETRON HCL 4 MG/2ML IJ SOLN
INTRAMUSCULAR | Status: DC | PRN
  Administered 2021-06-01: 4 mg via INTRAVENOUS

## 2021-06-01 MED ORDER — CHLORHEXIDINE GLUCONATE 0.12 % MT SOLN
15.0000 mL | Freq: Once | OROMUCOSAL | Status: AC
  Administered 2021-06-01: 15 mL via OROMUCOSAL

## 2021-06-01 MED ORDER — PHENYLEPHRINE HCL (PRESSORS) 10 MG/ML IV SOLN
INTRAVENOUS | Status: AC
  Filled 2021-06-01: qty 1

## 2021-06-01 SURGICAL SUPPLY — 65 items
BAG ZIPLOCK 12X15 (MISCELLANEOUS) ×2 IMPLANT
BASEPLATE AUG FULL 24X10X2 LAT (Plate) ×2 IMPLANT
BIT DRILL FLUTED 3.0 STRL (BIT) ×2 IMPLANT
BLADE SAG 18X100X1.27 (BLADE) ×2 IMPLANT
CALIBRATOR GLENOID VIP 5-D (SYSTAGENIX WOUND MANAGEMENT) ×2 IMPLANT
CHLORAPREP W/TINT 26 (MISCELLANEOUS) IMPLANT
COVER BACK TABLE 60X90IN (DRAPES) ×2 IMPLANT
COVER SURGICAL LIGHT HANDLE (MISCELLANEOUS) ×2 IMPLANT
COVER WAND RF STERILE (DRAPES) IMPLANT
CUP SUT UNIV REVERS 39 NEU (Shoulder) ×2 IMPLANT
DRAPE ORTHO SPLIT 77X108 STRL (DRAPES) ×2
DRAPE SHEET LG 3/4 BI-LAMINATE (DRAPES) ×2 IMPLANT
DRAPE SURG 17X11 SM STRL (DRAPES) ×2 IMPLANT
DRAPE SURG ORHT 6 SPLT 77X108 (DRAPES) ×2 IMPLANT
DRAPE TOP 10253 STERILE (DRAPES) ×2 IMPLANT
DRAPE U-SHAPE 47X51 STRL (DRAPES) ×2 IMPLANT
DRSG AQUACEL AG ADV 3.5X 6 (GAUZE/BANDAGES/DRESSINGS) IMPLANT
DRSG AQUACEL AG ADV 3.5X10 (GAUZE/BANDAGES/DRESSINGS) ×2 IMPLANT
ELECT REM PT RETURN 15FT ADLT (MISCELLANEOUS) ×2 IMPLANT
FACESHIELD WRAPAROUND (MASK) ×2 IMPLANT
GLENOSPHERE 39+4 LAT/24 UNI RV (Joint) ×2 IMPLANT
GLOVE SRG 8 PF TXTR STRL LF DI (GLOVE) ×2 IMPLANT
GLOVE SURG ENC MOIS LTX SZ7.5 (GLOVE) ×8 IMPLANT
GLOVE SURG UNDER POLY LF SZ8 (GLOVE) ×2
GOWN STRL REUS W/ TWL XL LVL3 (GOWN DISPOSABLE) ×2 IMPLANT
GOWN STRL REUS W/TWL XL LVL3 (GOWN DISPOSABLE) ×2
INSERT HUMERAL MED 39/ +3 (Shoulder) ×1 IMPLANT
INSERT MEDIUM HUMERAL 39/ +3 (Shoulder) ×1 IMPLANT
KIT BASIN OR (CUSTOM PROCEDURE TRAY) ×2 IMPLANT
KIT TURNOVER KIT A (KITS) ×2 IMPLANT
MANIFOLD NEPTUNE II (INSTRUMENTS) ×2 IMPLANT
NEEDLE TAPERED W/ NITINOL LOOP (MISCELLANEOUS) IMPLANT
NS IRRIG 1000ML POUR BTL (IV SOLUTION) ×2 IMPLANT
PACK SHOULDER (CUSTOM PROCEDURE TRAY) ×2 IMPLANT
PENCIL SMOKE EVACUATOR (MISCELLANEOUS) IMPLANT
PIN NITINOL TARGETER 2.8 (PIN) ×2 IMPLANT
POST MODULAR 25 (Post) ×1 IMPLANT
POST MODULAR MGS BASEPLATE 25 (Post) ×1 IMPLANT
PROTECTOR NERVE ULNAR (MISCELLANEOUS) ×2 IMPLANT
REAMER ANGLED HEAD SMALL (DRILL) ×2 IMPLANT
RESTRAINT HEAD UNIVERSAL NS (MISCELLANEOUS) IMPLANT
SCREW PERI LOCK 5.5X16 (Screw) ×2 IMPLANT
SCREW PERI LOCK 5.5X32 (Screw) ×4 IMPLANT
SCREW PERIPHERAL 5.5X20 LOCK (Screw) ×2 IMPLANT
SLING ARM FOAM STRAP MED (SOFTGOODS) IMPLANT
SLING ARM IMMOBILIZER LRG (SOFTGOODS) ×2 IMPLANT
SMARTMIX MINI TOWER (MISCELLANEOUS)
SPONGE LAP 4X18 RFD (DISPOSABLE) IMPLANT
STEM HUM UNIV SHLD 8 (Stem) ×2 IMPLANT
STRIP CLOSURE SKIN 1/2X4 (GAUZE/BANDAGES/DRESSINGS) ×2 IMPLANT
SUCTION FRAZIER HANDLE 10FR (MISCELLANEOUS) ×1
SUCTION TUBE FRAZIER 10FR DISP (MISCELLANEOUS) ×1 IMPLANT
SUT FIBERWIRE #2 38 T-5 BLUE (SUTURE)
SUT MON AB 3-0 SH 27 (SUTURE) ×1
SUT MON AB 3-0 SH27 (SUTURE) ×1 IMPLANT
SUT VIC AB 0 CT1 36 (SUTURE) ×2 IMPLANT
SUT VIC AB 1 CT1 36 (SUTURE) ×2 IMPLANT
SUT VIC AB 2-0 CT1 27 (SUTURE) ×1
SUT VIC AB 2-0 CT1 TAPERPNT 27 (SUTURE) ×1 IMPLANT
SUTURE FIBERWR #2 38 T-5 BLUE (SUTURE) IMPLANT
SUTURE TAPE 1.3 40 TPR END (SUTURE) ×2 IMPLANT
SUTURETAPE 1.3 40 TPR END (SUTURE) ×4
TAPE STRIPS DRAPE STRL (GAUZE/BANDAGES/DRESSINGS) ×2 IMPLANT
TOWEL OR 17X26 10 PK STRL BLUE (TOWEL DISPOSABLE) ×2 IMPLANT
TOWER SMARTMIX MINI (MISCELLANEOUS) IMPLANT

## 2021-06-01 NOTE — Progress Notes (Signed)
AssistedDr. Howse with left, ultrasound guided, interscalene  block. Side rails up, monitors on throughout procedure. See vital signs in flow sheet. Tolerated Procedure well.  

## 2021-06-01 NOTE — Brief Op Note (Signed)
06/01/2021  2:29 PM  PATIENT:  Lance Anderson  70 y.o. male  PRE-OPERATIVE DIAGNOSIS:  Left shoulder rotator cuff arthropathy  POST-OPERATIVE DIAGNOSIS:  Left shoulder rotator cuff arthropathy  PROCEDURE:  Procedure(s): REVERSE SHOULDER ARTHROPLASTY (Left)  SURGEON:  Surgeon(s) and Role:    Yolonda Kida, MD - Primary   ASSISTANTS: Dion Saucier, PA-C   ANESTHESIA:   regional and general  EBL:  200 mL   BLOOD ADMINISTERED:none  DRAINS: none   LOCAL MEDICATIONS USED:  NONE  SPECIMEN:  No Specimen  DISPOSITION OF SPECIMEN:  N/A  COUNTS:  YES  TOURNIQUET:  * No tourniquets in log *  DICTATION: .Note written in EPIC  PLAN OF CARE: Discharge to home after PACU  PATIENT DISPOSITION:  PACU - hemodynamically stable.   Delay start of Pharmacological VTE agent (>24hrs) due to surgical blood loss or risk of bleeding: not applicable

## 2021-06-01 NOTE — Anesthesia Procedure Notes (Signed)
Date/Time: 06/01/2021 2:51 PM Performed by: Minerva Ends, CRNA Oxygen Delivery Method: Simple face mask Placement Confirmation: positive ETCO2 and breath sounds checked- equal and bilateral Dental Injury: Teeth and Oropharynx as per pre-operative assessment

## 2021-06-01 NOTE — Transfer of Care (Signed)
Immediate Anesthesia Transfer of Care Note  Patient: Savior Himebaugh  Procedure(s) Performed: REVERSE SHOULDER ARTHROPLASTY (Left: Shoulder)  Patient Location: PACU  Anesthesia Type:General  Level of Consciousness: awake and alert   Airway & Oxygen Therapy: Patient Spontanous Breathing and Patient connected to face mask oxygen  Post-op Assessment: Report given to RN and Post -op Vital signs reviewed and stable  Post vital signs: Reviewed and stable  Last Vitals:  Vitals Value Taken Time  BP 139/81 06/01/21 1500  Temp    Pulse 58 06/01/21 1501  Resp 16 06/01/21 1501  SpO2 100 % 06/01/21 1501  Vitals shown include unvalidated device data.  Last Pain:  Vitals:   06/01/21 1227  TempSrc:   PainSc: 0-No pain         Complications: No notable events documented.

## 2021-06-01 NOTE — Discharge Instructions (Addendum)
Orthopedic surgery discharge instructions:  -Maintain postoperative bandage until follow-up appointment.  This is waterproof, and you may begin showering on postoperative day #3.  Do not submerge underwater.  Maintain that bandage until your follow-up appointment in 2 weeks.  -No lifting over 2 pounds with left arm.  You may use the left arm immediately for activities of daily living such as bathing, washing your face and brushing her teeth, eating, and getting dressed.  Otherwise maintain your sling when you are out of the house and sleeping.  -Apply ice liberally to the left shoulder throughout the day.  For mild to moderate pain use Tylenol and Advil as needed around-the-clock.  For breakthrough pain use oxycodone as necessary.  -You will return to see Dr. Rogers in the office in 2 weeks for routine postoperative check with x-rays.   

## 2021-06-01 NOTE — Op Note (Signed)
06/01/2021  2:37 PM  PATIENT:  Lance Anderson    PRE-OPERATIVE DIAGNOSIS:  Left shoulder rotator cuff arthropathy  POST-OPERATIVE DIAGNOSIS:  Same  PROCEDURE:  REVERSE SHOULDER ARTHROPLASTY  SURGEON:  Yolonda Kida, MD  ASSISTANT: Dion Saucier, PA-C  Assistant attestation: PA Mcclung was present for the entire procedure.  He participated in all critical portions.  He was integral for approach to the shoulder joint as well as performing the reverse arthroplasty and sound closure.  ANESTHESIA:   General  ESTIMATED BLOOD LOSS: 200  PREOPERATIVE INDICATIONS:  Lance Anderson is a  70 y.o. male with a diagnosis of Left shoulder rotator cuff arthropathy who failed conservative measures and elected for surgical management.  He sustained an on-the-job injury that resulted in a massive and retracted rotator cuff tear with atrophy.  Due to the size of the tear and the associated atrophy he was not a candidate for arthroscopy.  The risks benefits and alternatives were discussed with the patient preoperatively including but not limited to the risks of infection, bleeding, nerve injury, cardiopulmonary complications, the need for revision surgery, dislocation, brachial plexus palsy, incomplete relief of pain, among others, and the patient was willing to proceed.  OPERATIVE IMPLANTS:  Arthrex reverse system Size 24 baseplate with a 10 degree full augment and 2 mm of lateralization augment was oriented at about the 2 o'clock position. 39 mm +4 mm lateralized glenosphere 25 mm central post for the baseplate with 4 peripheral locking screws. Apex size 8 humeral stem with a standard tray and +3 mm liner.  OPERATIVE FINDINGS:  Lance Anderson had advanced rotator cuff arthropathy with high riding humeral head and articulation with the acromion.  He also had full-thickness cartilage loss on the central aspect of the humeral head.  Glenoid cartilage was intact.  There was diffuse longitudinal tearing  with flattening of the proximal biceps which was subluxated medially.  There was a full-thickness and retracted tear of the supraspinatus and infraspinatus.  There was 50% upper border tearing of the subscapularis with just a very contracted and scarred subscapularis tendon with very minimal excursion prior to our releases.  The subdeltoid space was quite adherent and adhesed as well.  OPERATIVE PROCEDURE: The patient was brought to the operating room and placed in the supine position. General anesthesia was administered. IV antibiotics were given. No foley was used. Time out was performed. The upper extremity was prepped and draped in usual sterile fashion. The patient was in a beachchair position. Deltopectoral approach was carried out.  Cephalic vein was mobilized and transported medially throughout the case.  The biceps was tenotomized.  The subscapularis was released off of the bone.  And tagged for retraction.  There was significant internal rotation contracture of the subscapularis with very minimal excursion prior to 270 degree releases.  I then performed circumferential releases of the humerus, and then dislocated the head, and then reamed with the reamer to the above named size.  I then applied the jig, and cut the humeral head in 30 of retroversion, and then turned my attention to the glenoid.  Deep retractors were placed, and I resected the labrum, and then placed a guidepin, utilizing the preoperative planning software with the VIP, into the center position on the glenoid, with slight inferior inclination. I then reamed over the guidepin, and this created a small metaphyseal cancellus blush inferiorly, removing just the cartilage to the subchondral bone superiorly. The base plate was selected and impacted place, with the 10 degree full  augment oriented at at the 2 o'clock position, and then I secured it centrally with 4 peripheral locking screws. I placed a short locking screws on anterior and  posterior aspects.  I then turned my attention to the glenosphere, and impacted this into place.  The glenoid sphere was completely seated, and had engagement of the [facility] taper.  Central set screw was placed.  I then turned my attention back to the humerus.  I sequentially broached, and then trialed, and was found to restore soft tissue tension, and it had 2 finger tightness. Therefore the above named components were selected. The shoulder felt stable throughout functional motion.  The subscapularis was chronically torn and retracted and was not viable for repair.  I then impacted the real prosthesis into place, as well as the real humeral tray, and reduced the shoulder. The shoulder had excellent motion, and was stable, and I irrigated the wounds copiously.  We placed 1 g of vancomycin powder into the deltopectoral interval.   I then irrigated the shoulder copiously once more, repaired the deltopectoral interval with # 1 Vicryl followed by subcutaneous Vicryl, then monocryl for the skin,  with Steri-Strips and sterile gauze for the skin. The patient was awakened and returned back in stable and satisfactory condition. There no complications and they tolerated the procedure well.  All counts were correct x2.  Disposition: Lance Anderson will be nonweightbearing to the left upper extremity except for activities of daily living.  He will be in his sling for comfort.  He may remove the sling for activities of daily living and personal hygiene.  He will discharge home from PACU.  He will follow-up with me in the office in 2 weeks for postoperative wound check and 2 x-ray views of the left shoulder.

## 2021-06-01 NOTE — Anesthesia Procedure Notes (Signed)
Anesthesia Regional Block: Interscalene brachial plexus block   Pre-Anesthetic Checklist: , timeout performed,  Correct Patient, Correct Site, Correct Laterality,  Correct Procedure, Correct Position, site marked,  Risks and benefits discussed,  Pre-op evaluation,  At surgeon's request and post-op pain management  Laterality: Left  Prep: Maximum Sterile Barrier Precautions used, chloraprep       Needles:  Injection technique: Single-shot  Needle Type: Echogenic Stimulator Needle     Needle Length: 4cm  Needle Gauge: 22     Additional Needles:   Procedures:,,,, ultrasound used (permanent image in chart),,    Narrative:  Start time: 06/01/2021 12:20 PM End time: 06/01/2021 12:23 PM Injection made incrementally with aspirations every 5 mL.  Performed by: Personally  Anesthesiologist: Kaylyn Layer, MD  Additional Notes: Risks, benefits, and alternative discussed. Patient gave consent for procedure. Patient prepped and draped in sterile fashion. Sedation administered, patient remains easily responsive to voice. Relevant anatomy identified with ultrasound guidance. Local anesthetic given in 5cc increments with no signs or symptoms of intravascular injection. No pain or paraesthesias with injection. Patient monitored throughout procedure with signs of LAST or immediate complications. Tolerated well. Ultrasound image placed in chart.  Lance Greenhouse, MD

## 2021-06-01 NOTE — Anesthesia Postprocedure Evaluation (Signed)
Anesthesia Post Note  Patient: Lance Anderson  Procedure(s) Performed: REVERSE SHOULDER ARTHROPLASTY (Left: Shoulder)     Patient location during evaluation: PACU Anesthesia Type: General Level of consciousness: sedated Pain management: pain level controlled Vital Signs Assessment: post-procedure vital signs reviewed and stable Respiratory status: spontaneous breathing and respiratory function stable Cardiovascular status: stable Postop Assessment: no apparent nausea or vomiting Anesthetic complications: no   No notable events documented.  Last Vitals:  Vitals:   06/01/21 1500 06/01/21 1515  BP: 139/81 125/62  Pulse: 62 74  Resp: 12 18  Temp: 36.5 C   SpO2: 100% 97%    Last Pain:  Vitals:   06/01/21 1500  TempSrc:   PainSc: 0-No pain                 Loa Idler DANIEL

## 2021-06-01 NOTE — Anesthesia Procedure Notes (Signed)
Procedure Name: Intubation Date/Time: 06/01/2021 1:06 PM Performed by: Minerva Ends, CRNA Pre-anesthesia Checklist: Patient identified, Emergency Drugs available, Suction available and Patient being monitored Patient Re-evaluated:Patient Re-evaluated prior to induction Oxygen Delivery Method: Circle System Utilized Preoxygenation: Pre-oxygenation with 100% oxygen Induction Type: IV induction and Cricoid Pressure applied Ventilation: Mask ventilation without difficulty and Oral airway inserted - appropriate to patient size Laryngoscope Size: Miller and 2 Tube type: Oral Number of attempts: 1 Airway Equipment and Method: Stylet Placement Confirmation: ETT inserted through vocal cords under direct vision, positive ETCO2 and breath sounds checked- equal and bilateral Secured at: 23 cm Tube secured with: Tape Dental Injury: Teeth and Oropharynx as per pre-operative assessment  Comments: Smooth IV induction Howze--- intubation AM CRNA teeth, atraumatic. Very slight crack in lower lip- minimal bleeding- teeth chipped - unchanged-- bilat BS Howze

## 2021-06-01 NOTE — H&P (Signed)
ORTHOPAEDIC H and P  REQUESTING PHYSICIAN: Yolonda Kida, MD  PCP:  Christen Butter, NP  Chief Complaint: Left shoulder pain  HPI: Lance Anderson is a 70 y.o. male who complains of left shoulder pain and weakness that has been recalcitrant to conservative care.  He is noted to have a massive and retracted rotator cuff tear.  This was deemed to be a repairable based on fatty atrophy and volume loss.  He is here today for open reverse shoulder arthroplasty to address this problem.  No new complaints.  We have previously discussed this in the office.  Past Medical History:  Diagnosis Date   Abdominal aortic ectasia (HCC) 03/12/2018   Allergy    Cataract    Complication of anesthesia    low BP after anesthesia,syncope episode.   DDD (degenerative disc disease), lumbar    Depression    Hyperlipidemia    Hypertension    RBBB    Renal insufficiency 02/25/2019   Vasovagal syncope    Past Surgical History:  Procedure Laterality Date   CATARACT EXTRACTION, BILATERAL     KNEE ARTHROSCOPY W/ MENISCAL REPAIR Bilateral    ROTATOR CUFF REPAIR Right    WISDOM TOOTH EXTRACTION     Social History   Socioeconomic History   Marital status: Married    Spouse name: Joyce Gross   Number of children: 1   Years of education: 12   Highest education level: 12th grade  Occupational History   Occupation: Curator    Comment: retired  Tobacco Use   Smoking status: Never   Smokeless tobacco: Never  Vaping Use   Vaping Use: Never used  Substance and Sexual Activity   Alcohol use: Yes    Comment: Occasionally, average 10 drinks/month, usually beer   Drug use: No   Sexual activity: Yes  Other Topics Concern   Not on file  Social History Narrative   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    Social Determinants of Health   Financial Resource Strain: Low Risk    Difficulty of Paying Living Expenses: Not hard at all  Food Insecurity: No Food  Insecurity   Worried About Programme researcher, broadcasting/film/video in the Last Year: Never true   Ran Out of Food in the Last Year: Never true  Transportation Needs: No Transportation Needs   Lack of Transportation (Medical): No   Lack of Transportation (Non-Medical): No  Physical Activity: Insufficiently Active   Days of Exercise per Week: 4 days   Minutes of Exercise per Session: 20 min  Stress: No Stress Concern Present   Feeling of Stress : Not at all  Social Connections: Moderately Integrated   Frequency of Communication with Friends and Family: More than three times a week   Frequency of Social Gatherings with Friends and Family: More than three times a week   Attends Religious Services: More than 4 times per year   Active Member of [employer] or Organizations: No   Attends Engineer, structural: Never   Marital Status: Married   Family History  Problem Relation Age of Onset   Heart disease Father    Lung cancer Father    Heart attack Brother    Heart attack Paternal Grandfather    Allergies  Allergen Reactions   Codeine Other (See Comments)    Chest pain   Paroxetine Other (See Comments)    Makes spacey  Penicillins Diarrhea    Reaction: 30 years   Prior to Admission medications   Medication Sig Start Date End Date Taking? Authorizing Provider  amLODipine (NORVASC) 10 MG tablet Take 1 tablet (10 mg total) by mouth daily. **PATIENT NEEDS OFFICE VISIT FOR ADDITIONAL REFILLS** 05/16/21  Yes Christen Butter, NP  aspirin 81 MG EC tablet Take 1 tablet (81 mg total) by mouth daily. 02/25/18  Yes Carlis Stable, PA-C  atorvastatin (LIPITOR) 40 MG tablet Take 1 tablet (40 mg total) by mouth at bedtime. **PATIENT NEEDS OFFICE VISIT FOR ADDITIONAL REFILLS** 05/16/21  Yes Christen Butter, NP  FLUoxetine (PROZAC) 20 MG capsule Take 1 capsule by mouth once daily Patient taking differently: Take 20 mg by mouth daily. 02/26/21  Yes Christen Butter, NP  fluticasone (FLONASE) 50 MCG/ACT nasal spray Place 2  sprays into both nostrils daily. **PATIENT NEEDS OFFICE VISIT FOR ADDITIONAL REFILLS** 05/16/21  Yes Christen Butter, NP  Melatonin 10 MG CAPS Take 10 mg by mouth at bedtime as needed (sleep).   Yes [provider]  meloxicam (MOBIC) 15 MG tablet One tab PO qAM with a meal for 2 weeks, then daily prn pain. Patient taking differently: Take 15 mg by mouth daily. 04/13/21  Yes Monica Becton, MD  Fluticasone-Salmeterol (ADVAIR DISKUS) 250-50 MCG/DOSE AEPB Inhale 1 puff into the lungs in the morning and at bedtime. Patient not taking: No sig reported 01/05/21   Martina Sinner, MD  Misc. Devices MISC Start CPAP at 13 cm. water pressure with EPR 1.  CPAP machine with a mask and supplies for OSA with AHI 10.  AirFit P10(S) nasal pillow mask or patient preference.  Send to a local DME.  [facility] or first available 01/02/21   [provider]   No results found.  Positive ROS: All other systems have been reviewed and were otherwise negative with the exception of those mentioned in the HPI and as above.  Physical Exam: General: Alert, no acute distress Cardiovascular: No pedal edema Respiratory: No cyanosis, no use of accessory musculature GI: No organomegaly, abdomen is soft and non-tender Skin: No lesions in the area of chief complaint Neurologic: Sensation intact distally Psychiatric: Patient is competent for consent with normal mood and affect Lymphatic: No axillary or cervical lymphadenopathy  MUSCULOSKELETAL:  Left upper extremity is warm and well-perfused.  No open wounds or skin lesions.  Distally neurovascularly intact.  Assessment: Left shoulder rotator cuff arthropathy.  Plan: -Plan will be to proceed with reverse shoulder arthroplasty for his rotator cuff arthropathy.  We again reviewed the risk of bleeding, infection, damage to surrounding nerves and vessels, dislocation, fracture, need for revision surgery, DVT, and the risk of anesthesia.  He has provided  informed consent to proceed.  -We will plan for discharge home from PACU pending no issues with surgery or anesthesia.  He will follow-up with me in 2 weeks postop.    Yolonda Kida, MD Cell [phone]    06/01/2021 12:28 PM

## 2021-06-04 ENCOUNTER — Encounter (HOSPITAL_COMMUNITY): Payer: Self-pay | Admitting: Orthopedic Surgery

## 2021-06-06 ENCOUNTER — Other Ambulatory Visit: Payer: Self-pay | Admitting: Medical-Surgical

## 2021-06-06 DIAGNOSIS — I10 Essential (primary) hypertension: Secondary | ICD-10-CM

## 2021-06-06 DIAGNOSIS — E785 Hyperlipidemia, unspecified: Secondary | ICD-10-CM

## 2021-06-06 DIAGNOSIS — J309 Allergic rhinitis, unspecified: Secondary | ICD-10-CM

## 2021-06-11 ENCOUNTER — Other Ambulatory Visit: Payer: Self-pay | Admitting: Medical-Surgical

## 2021-06-11 DIAGNOSIS — F3342 Major depressive disorder, recurrent, in full remission: Secondary | ICD-10-CM

## 2021-06-14 DIAGNOSIS — Z4789 Encounter for other orthopedic aftercare: Secondary | ICD-10-CM | POA: Insufficient documentation

## 2021-06-30 ENCOUNTER — Other Ambulatory Visit: Payer: Self-pay | Admitting: Medical-Surgical

## 2021-06-30 DIAGNOSIS — I10 Essential (primary) hypertension: Secondary | ICD-10-CM

## 2021-06-30 DIAGNOSIS — E785 Hyperlipidemia, unspecified: Secondary | ICD-10-CM

## 2021-07-05 ENCOUNTER — Other Ambulatory Visit: Payer: Self-pay

## 2021-07-05 ENCOUNTER — Encounter: Payer: Self-pay | Admitting: Medical-Surgical

## 2021-07-05 ENCOUNTER — Ambulatory Visit (INDEPENDENT_AMBULATORY_CARE_PROVIDER_SITE_OTHER): Payer: Medicare Other | Admitting: Medical-Surgical

## 2021-07-05 VITALS — BP 135/78 | HR 63 | Temp 98.5°F | Ht 71.0 in | Wt 245.5 lb

## 2021-07-05 DIAGNOSIS — M533 Sacrococcygeal disorders, not elsewhere classified: Secondary | ICD-10-CM

## 2021-07-05 DIAGNOSIS — I10 Essential (primary) hypertension: Secondary | ICD-10-CM

## 2021-07-05 DIAGNOSIS — B351 Tinea unguium: Secondary | ICD-10-CM | POA: Diagnosis not present

## 2021-07-05 DIAGNOSIS — E785 Hyperlipidemia, unspecified: Secondary | ICD-10-CM | POA: Diagnosis not present

## 2021-07-05 DIAGNOSIS — M1712 Unilateral primary osteoarthritis, left knee: Secondary | ICD-10-CM

## 2021-07-05 DIAGNOSIS — J309 Allergic rhinitis, unspecified: Secondary | ICD-10-CM

## 2021-07-05 DIAGNOSIS — Z23 Encounter for immunization: Secondary | ICD-10-CM | POA: Diagnosis not present

## 2021-07-05 DIAGNOSIS — F3342 Major depressive disorder, recurrent, in full remission: Secondary | ICD-10-CM

## 2021-07-05 MED ORDER — PREDNISONE 50 MG PO TABS: 50.0000 mg | ORAL_TABLET | Freq: Every day | ORAL | 0 refills | Status: DC

## 2021-07-05 MED ORDER — MELOXICAM 15 MG PO TABS: ORAL_TABLET | ORAL | 3 refills | Status: DC

## 2021-07-05 MED ORDER — FLUOXETINE HCL 20 MG PO CAPS: 20.0000 mg | ORAL_CAPSULE | Freq: Every day | ORAL | 0 refills | Status: DC

## 2021-07-05 MED ORDER — ATORVASTATIN CALCIUM 40 MG PO TABS: 40.0000 mg | ORAL_TABLET | Freq: Every day | ORAL | 0 refills | Status: DC

## 2021-07-05 MED ORDER — AMLODIPINE BESYLATE 10 MG PO TABS: 10.0000 mg | ORAL_TABLET | Freq: Every day | ORAL | 0 refills | Status: DC

## 2021-07-05 MED ORDER — SHINGRIX 50 MCG/0.5ML IM SUSR: 0.5000 mL | Freq: Once | INTRAMUSCULAR | 0 refills | Status: AC

## 2021-07-05 MED ORDER — TERBINAFINE HCL 250 MG PO TABS: 250.0000 mg | ORAL_TABLET | Freq: Every day | ORAL | 0 refills | Status: AC

## 2021-07-05 NOTE — Progress Notes (Signed)
Subjective:    CC: Chronic disease follow-up  HPI: Pleasant 70 year old male presenting for the following:  Allergies-had about 8 months of difficulty with allergies and upper respiratory congestion including coughing and wheezing last year but this has since resolved.  He was seen by ENT but they were unable to find a reason for his symptoms as well.  He is no longer taking a daily antihistamine and only using Flonase as needed.  Feels that his symptoms are well managed.  Hypertension-taking amlodipine 10 mg daily, tolerating well without side effects.  He does not add extra salt to his food and does not check his blood pressure at home. Denies CP, SOB, palpitations, lower extremity edema, dizziness, headaches, or vision changes.  Hyperlipidemia-taking atorvastatin 40 mg daily, tolerating well without side effects.  Mood-history of recurrent depression currently treated with fluoxetine 20 mg daily.  He tolerates the medication well and takes as prescribed.  Feels that his symptoms are well controlled.  Denies SI/HI.  Does note he has been more restless lately but that is more related to his recent shoulder surgery and inactivity.  Toenail issues-Notes that he has had a toenail fungus for many years that affects both of his feet and most of his toenails.  He is interested in medication to help treat this at this point.  Not interested in toenail removal or podiatry referral but would like to try the oral medications.  He does have a history of low back pain with sciatica.  Notes that over the last week or so, he has had an increase in his right-sided sciatica and SI joint pain.  He has been using meloxicam 15 mg daily and working to stay active but his pain has still not resolved.  I reviewed the past medical history, family history, social history, surgical history, and allergies today and no changes were needed.  Please see the problem list section below in epic for further details.  Past  Medical History: Past Medical History:  Diagnosis Date   Abdominal aortic ectasia (HCC) 03/12/2018   Allergy    Cataract    Complication of anesthesia    low BP after anesthesia,syncope episode.   DDD (degenerative disc disease), lumbar    Depression    Hyperlipidemia    Hypertension    RBBB    Renal insufficiency 02/25/2019   Vasovagal syncope    Past Surgical History: Past Surgical History:  Procedure Laterality Date   CATARACT EXTRACTION, BILATERAL     KNEE ARTHROSCOPY W/ MENISCAL REPAIR Bilateral    REVERSE SHOULDER ARTHROPLASTY Left 06/01/2021   Procedure: REVERSE SHOULDER ARTHROPLASTY;  Surgeon: Yolonda Kida, MD;  Location: [facility];  Service: Orthopedics;  Laterality: Left;   ROTATOR CUFF REPAIR Right    WISDOM TOOTH EXTRACTION     Social History: Social History   Socioeconomic History   Marital status: Married    Spouse name: Joyce Gross   Number of children: 1   Years of education: 12   Highest education level: 12th grade  Occupational History   Occupation: Curator    Comment: retired  Tobacco Use   Smoking status: Never   Smokeless tobacco: Never  Vaping Use   Vaping Use: Never used  Substance and Sexual Activity   Alcohol use: Yes    Comment: Occasionally, average 10 drinks/month, usually beer   Drug use: No   Sexual activity: Yes  Other Topics Concern   Not on file  Social History Narrative   Works part time driving  a truck   Plays with grandkids in the yard   [place] 2-3 times a day.    Social Determinants of Health   Financial Resource Strain: Low Risk    Difficulty of Paying Living Expenses: Not hard at all  Food Insecurity: No Food Insecurity   Worried About Programme researcher, broadcasting/film/video in the Last Year: Never true   Ran Out of Food in the Last Year: Never true  Transportation Needs: No Transportation Needs   Lack of Transportation (Medical): No   Lack of Transportation (Non-Medical): No  Physical Activity: Insufficiently Active   Days of Exercise  per Week: 4 days   Minutes of Exercise per Session: 20 min  Stress: No Stress Concern Present   Feeling of Stress : Not at all  Social Connections: Moderately Integrated   Frequency of Communication with Friends and Family: More than three times a week   Frequency of Social Gatherings with Friends and Family: More than three times a week   Attends Religious Services: More than 4 times per year   Active Member of [employer] or Organizations: No   Attends Banker Meetings: Never   Marital Status: Married   Family History: Family History  Problem Relation Age of Onset   Heart disease Father    Lung cancer Father    Heart attack Brother    Heart attack Paternal Grandfather    Allergies: Allergies  Allergen Reactions   Codeine Other (See Comments)    Chest pain   Penicillins Diarrhea    Reaction: 30 years   Medications: See med rec.  Review of Systems: See HPI for pertinent positives and negatives.   Objective:    General: Well Developed, well nourished, and in no acute distress.  Neuro: Alert and oriented x3.  HEENT: Normocephalic, atraumatic.  Skin: Warm and dry. Cardiac: Regular rate and rhythm, no murmurs rubs or gallops, no lower extremity edema.  Respiratory: Clear to auscultation bilaterally. Not using accessory muscles, speaking in full sentences.  Impression and Recommendations:    1. Essential hypertension with goal blood pressure less than 140/90 Continue amlodipine 10 mg daily.  Blood pressure at goal today.  Checking CBC with differential and CMP. - amLODipine (NORVASC) 10 MG tablet; Take 1 tablet (10 mg total) by mouth daily.  Dispense: 90 tablet; Refill: 0 - CBC with Differential/Platelet - COMPLETE METABOLIC PANEL WITH GFR  2. Dyslipidemia Continue atorvastatin 40 mg daily.  Checking lipid panel. - atorvastatin (LIPITOR) 40 MG tablet; Take 1 tablet (40 mg total) by mouth daily.  Dispense: 90 tablet; Refill: 0 - Lipid panel  3. Recurrent major  depressive disorder, in full remission (HCC) Symptoms well-controlled.  Continue fluoxetine 20 mg daily - FLUoxetine (PROZAC) 20 MG capsule; Take 1 capsule (20 mg total) by mouth daily.  Dispense: 90 capsule; Refill: 0  4. Onychomycosis Discussed the nature of toenail fungus that has been present for long-term.  Starting terbinafine 250 mg daily for the next 12 weeks.  This will take months to fully resolve.  Would like patient to return in about 4 weeks for recheck of liver function to assess tolerance of terbinafine.  Order placed.  5. SI (sacroiliac) joint dysfunction Continue conservative management.  Starting prednisone 50 mg x 5 days.  Once prednisone is complete continue meloxicam 15 mg daily.  6. Primary osteoarthritis of left knee Continue meloxicam 15 mg daily after completing prednisone. - meloxicam (MOBIC) 15 MG tablet; One tab PO qAM with a meal for 2  weeks, then daily prn pain.  Dispense: 30 tablet; Refill: 3  7. Need for shingles vaccine Printed prescription for Shingrix given to patient and he was instructed to take this to his pharmacy where they can complete his vaccination. - Zoster Vaccine Adjuvanted [facility]) injection; Inject 0.5 mLs into the muscle once for 1 dose.  Dispense: 0.5 mL; Refill: 0  Return in about 6 months (around 01/05/2022) for chronic disease follow up. ___________________________________________ Thayer Ohm, DNP, APRN, FNP-BC Primary Care and Sports Medicine [facility] [place]

## 2021-07-06 LAB — CBC WITH DIFFERENTIAL/PLATELET
Absolute Monocytes: 604 cells/uL (ref 200–950)
Basophils Absolute: 67 cells/uL (ref 0–200)
Basophils Relative: 1.1 %
Eosinophils Absolute: 232 cells/uL (ref 15–500)
Eosinophils Relative: 3.8 %
HCT: 41.6 % (ref 38.5–50.0)
Hemoglobin: 13.8 g/dL (ref 13.2–17.1)
Lymphs Abs: 1806 cells/uL (ref 850–3900)
MCH: 30.9 pg (ref 27.0–33.0)
MCHC: 33.2 g/dL (ref 32.0–36.0)
MCV: 93.1 fL (ref 80.0–100.0)
MPV: 11.1 fL (ref 7.5–12.5)
Monocytes Relative: 9.9 %
Neutro Abs: 3392 cells/uL (ref 1500–7800)
Neutrophils Relative %: 55.6 %
Platelets: 223 10*3/uL (ref 140–400)
RBC: 4.47 10*6/uL (ref 4.20–5.80)
RDW: 12.2 % (ref 11.0–15.0)
Total Lymphocyte: 29.6 %
WBC: 6.1 10*3/uL (ref 3.8–10.8)

## 2021-07-06 LAB — COMPLETE METABOLIC PANEL WITH GFR
AG Ratio: 1.5 (calc) (ref 1.0–2.5)
ALT: 18 U/L (ref 9–46)
AST: 19 U/L (ref 10–35)
Albumin: 4 g/dL (ref 3.6–5.1)
Alkaline phosphatase (APISO): 84 U/L (ref 35–144)
BUN/Creatinine Ratio: 26 (calc) — ABNORMAL HIGH (ref 6–22)
BUN: 28 mg/dL — ABNORMAL HIGH (ref 7–25)
CO2: 25 mmol/L (ref 20–32)
Calcium: 9.1 mg/dL (ref 8.6–10.3)
Chloride: 108 mmol/L (ref 98–110)
Creat: 1.06 mg/dL (ref 0.70–1.35)
Globulin: 2.7 g/dL (calc) (ref 1.9–3.7)
Glucose, Bld: 87 mg/dL (ref 65–99)
Potassium: 4.5 mmol/L (ref 3.5–5.3)
Sodium: 139 mmol/L (ref 135–146)
Total Bilirubin: 0.6 mg/dL (ref 0.2–1.2)
Total Protein: 6.7 g/dL (ref 6.1–8.1)
eGFR: 76 mL/min/{1.73_m2} (ref >=60)

## 2021-07-06 LAB — LIPID PANEL
Cholesterol: 154 mg/dL (ref <200)
HDL: 54 mg/dL (ref >=40)
LDL Cholesterol (Calc): 87 mg/dL (calc)
Non-HDL Cholesterol (Calc): 100 mg/dL (calc) (ref <130)
Total CHOL/HDL Ratio: 2.9 (calc) (ref <5.0)
Triglycerides: 51 mg/dL (ref <150)

## 2021-07-18 ENCOUNTER — Encounter: Payer: Self-pay | Admitting: Medical-Surgical

## 2021-07-18 NOTE — Progress Notes (Signed)
Virtual Video Visit via MyChart Note  I connected with  Lance Anderson on 07/19/21 at  8:10 AM EDT by the video enabled telemedicine application for MyChart, and verified that I am speaking with the correct person using two identifiers.   I introduced myself as a Publishing rights manager with the practice. We discussed the limitations of evaluation and management by telemedicine and the availability of in person appointments. The patient expressed understanding and agreed to proceed.  Participating parties in this visit include: The patient and the nurse practitioner listed.  The patient is: At home I am: In the office - Primary Care Kathryne Sharper  Subjective:    CC:  Chief Complaint  Patient presents with   Covid Positive    HPI: Lance Anderson is a 70 y.o. year old male presenting today via MyChart today for positive COVID test.  Last Saturday (07/14/21) patient started to notice a sore throat. By Sunday he also had temps 101-102F, headache, and nausea. Negative home COVID test. Reports he started feeling a little bit better Monday/Tuesday, but had a low-grade temp around 100F yesterday which prompted him to take another COVID test which was positive. States he is feeling pretty good today with temp 98.35F. Mild sore throat with some PND. He denies shortness of breath, chest pain, wheezing, nausea, vomiting, diarrhea, body aches, cough. He has gotten relief with Tylenol and Mucinex. States he is not vaccinated. He had COVID in winter of 2020 and did well at that time also.     Past medical history, Surgical history, Family history not pertinant except as noted below, Social history, Allergies, and medications have been entered into the medical record, reviewed, and corrections made.   Review of Systems:  All review of systems negative except what is listed in the HPI   Objective:    General:  Speaking clearly in complete sentences. Absent shortness of breath noted.   Alert and oriented x3.    Normal judgment.  Absent acute distress.   Impression and Recommendations:    1. COVID-19 Today is day 6 of symptoms and patient is already feeling quite a bit better. Out of window for antiviral therapy. Recommend he continue supportive measures including rest, hydration, humidifier, warm liquids with honey and lemon, OTC cough/cold/analgesics, nasal spray. He is aware of current quarantine recommendations. Patient aware of signs/symptoms requiring further/urgent evaluation.   Follow-up if symptoms worsen or fail to improve.    I discussed the assessment and treatment plan with the patient. The patient was provided an opportunity to ask questions and all were answered. The patient agreed with the plan and demonstrated an understanding of the instructions.   The patient was advised to call back or seek an in-person evaluation if the symptoms worsen or if the condition fails to improve as anticipated.  I spent 20 minutes dedicated to the care of this patient on the date of this encounter to include pre-visit chart review of prior notes and results, face-to-face time with the patient, and post-visit ordering of testing as indicated.   Lollie Marrow Reola Calkins, DNP, FNP-C

## 2021-07-19 ENCOUNTER — Encounter: Payer: Self-pay | Admitting: Family Medicine

## 2021-07-19 ENCOUNTER — Telehealth (INDEPENDENT_AMBULATORY_CARE_PROVIDER_SITE_OTHER): Payer: Medicare Other | Admitting: Family Medicine

## 2021-07-19 DIAGNOSIS — U071 COVID-19: Secondary | ICD-10-CM | POA: Diagnosis not present

## 2021-07-19 NOTE — Patient Instructions (Signed)
Glad to hear you are feeling better already! Continue supportive measures.  Current quarantine guidelines recommend that you can come out of quarantine on Day 5 if feeling better as long as you wear a mask for the next 5 days. Otherwise, stay quarantined for the full 10 days.   Over the counter medications that may be helpful for symptoms:  Guaifenesin 1200 mg extended release tabs twice daily, with plenty of water For cough and congestion Brand name: Mucinex   Pseudoephedrine 30 mg, one or two tabs every 4 to 6 hours For sinus congestion Brand name: Sudafed You must get this from the pharmacy counter.  Oxymetazoline nasal spray each morning, one spray in each nostril, for NO MORE THAN 3 days  For nasal and sinus congestion Brand name: Afrin Saline nasal spray or Saline Nasal Irrigation 3-5 times a day For nasal and sinus congestion Brand names: Ocean or AYR Fluticasone nasal spray, one spray in each nostril, each morning (after oxymetazoline and saline, if used) For nasal and sinus congestion Brand name: Flonase Warm salt water gargles  For sore throat Every few hours as needed Alternate ibuprofen 400-600 mg and acetaminophen 1000 mg every 4-6 hours For fever, body aches, headache Brand names: Motrin or Advil and Tylenol Dextromethorphan 12-hour cough version 30 mg every 12 hours  For cough Brand name: Delsym Stop all other cold medications for now (Nyquil, Dayquil, Tylenol Cold, Theraflu, etc) and other non-prescription cough/cold preparations. Many of these have the same ingredients listed above and could cause an overdose of medication.   Herbal treatments that have been shown to be helpful in some patients include: Vitamin C 1000mg  per day Vitamin D 4000iU per day Zinc 100mg  per day Quercetin 25-500mg  twice a day Melatonin 5-10mg  at bedtime  General Instructions Allow your body to rest Drink PLENTY of fluids Isolate yourself from everyone, even family, until test  results have returned  If your COVID-19 test is positive Then you ARE INFECTED and you can pass the virus to others You must quarantine from others for a minimum of  10 days since symptoms started AND You are fever free for 24 hours WITHOUT any medication to reduce fever AND Your symptoms are improving Do not go to the store or other public areas Do not go around household members who are not known to be infected with COVID-19 If you MUST leave your area of quarantine (example: go to a bathroom you share with others in your home), you must Wear a mask Wash your hands thoroughly Wipe down any surfaces you touch Do not share food, drinks, towels, or other items with other persons Dispose of your own tissues, food containers, etc  Once you have recovered, please continue good preventive care measures, including:  wearing a mask when in public wash your hands frequently avoid touching your face/nose/eyes cover coughs/sneezes with the inside of your elbow stay out of crowds keep a 6 foot distance from others  If you develop severe shortness of breath, uncontrolled fevers, coughing up blood, confusion, chest pain, or signs of dehydration (such as significantly decreased urine amounts or dizziness with standing) please go to the ER.

## 2021-09-04 ENCOUNTER — Other Ambulatory Visit: Payer: Self-pay | Admitting: Medical-Surgical

## 2021-09-04 DIAGNOSIS — B351 Tinea unguium: Secondary | ICD-10-CM

## 2021-09-05 LAB — HEPATIC FUNCTION PANEL
AG Ratio: 1.6 (calc) (ref 1.0–2.5)
ALT: 21 U/L (ref 9–46)
AST: 24 U/L (ref 10–35)
Albumin: 4 g/dL (ref 3.6–5.1)
Alkaline phosphatase (APISO): 84 U/L (ref 35–144)
Bilirubin, Direct: 0.1 mg/dL (ref 0.0–0.2)
Globulin: 2.5 g/dL (calc) (ref 1.9–3.7)
Indirect Bilirubin: 0.4 mg/dL (calc) (ref 0.2–1.2)
Total Bilirubin: 0.5 mg/dL (ref 0.2–1.2)
Total Protein: 6.5 g/dL (ref 6.1–8.1)

## 2021-09-16 ENCOUNTER — Other Ambulatory Visit: Payer: Self-pay | Admitting: Medical-Surgical

## 2021-09-16 DIAGNOSIS — E785 Hyperlipidemia, unspecified: Secondary | ICD-10-CM

## 2021-09-16 DIAGNOSIS — I10 Essential (primary) hypertension: Secondary | ICD-10-CM

## 2021-10-04 ENCOUNTER — Telehealth: Payer: Self-pay | Admitting: *Deleted

## 2021-10-04 NOTE — Chronic Care Management (AMB) (Signed)
  Chronic Care Management   Note  10/04/2021 Name: Lance Anderson MRN: [id number] DOB: May 07, 1951  Lance Anderson is a 70 y.o. year old male who is a primary care patient of Samuel Bouche, NP. I reached out to Bethann Humble by phone today in response to a referral sent by Lance Anderson PCP.  Lance Anderson was given information about Chronic Care Management services today including:  CCM service includes personalized support from designated clinical staff supervised by his physician, including individualized plan of care and coordination with other care providers 24/7 contact phone numbers for assistance for urgent and routine care needs. Service will only be billed when office clinical staff spend 20 minutes or more in a month to coordinate care. Only one practitioner may furnish and bill the service in a calendar month. The patient may stop CCM services at any time (effective at the end of the month) by phone call to the office staff. The patient is responsible for co-pay (up to 20% after annual deductible is met) if co-pay is required by the individual health plan.   Patient agreed to services and verbal consent obtained.   Follow up plan: Telephone appointment with care management team member scheduled for: 10/10/2021  Julian Hy, [place], [place] [facility]  Direct Dial: [phone]

## 2021-10-09 ENCOUNTER — Other Ambulatory Visit: Payer: Self-pay | Admitting: Medical-Surgical

## 2021-10-09 DIAGNOSIS — F3342 Major depressive disorder, recurrent, in full remission: Secondary | ICD-10-CM

## 2021-10-10 ENCOUNTER — Telehealth: Payer: Self-pay

## 2021-10-10 ENCOUNTER — Ambulatory Visit (INDEPENDENT_AMBULATORY_CARE_PROVIDER_SITE_OTHER): Payer: Medicare Other

## 2021-10-10 DIAGNOSIS — E785 Hyperlipidemia, unspecified: Secondary | ICD-10-CM

## 2021-10-10 DIAGNOSIS — E6609 Other obesity due to excess calories: Secondary | ICD-10-CM

## 2021-10-10 DIAGNOSIS — I10 Essential (primary) hypertension: Secondary | ICD-10-CM

## 2021-10-10 NOTE — Telephone Encounter (Signed)
Pt left VM stating he is having trouble getting a medication. Pt did not say which medication. Called pt back, no answer and went to VM. Left VM with direct call back number.

## 2021-10-10 NOTE — Patient Instructions (Signed)
Visit Information   Consent to CCM Services: Lance Anderson was given information about Chronic Care Management services including:  CCM service includes personalized support from designated clinical staff supervised by his physician, including individualized plan of care and coordination with other care providers 24/7 contact phone numbers for assistance for urgent and routine care needs. Service will only be billed when office clinical staff spend 20 minutes or more in a month to coordinate care. Only one practitioner may furnish and bill the service in a calendar month. The patient may stop CCM services at any time (effective at the end of the month) by phone call to the office staff. The patient will be responsible for cost sharing (co-pay) of up to 20% of the service fee (after annual deductible is met).  Patient agreed to services and verbal consent obtained.   Patient verbalizes understanding of instructions provided today and agrees to view in [place].   Telephone follow up appointment with care management team member scheduled for: 11/07/21 The patient has been provided with contact information for the care management team and has been advised to call with any health related questions or concerns.   Thea Silversmith, RN, MSN, BSN, CCM Care Management Coordinator [facility] [phone]      CLINICAL CARE PLAN: Patient Care Plan: RN Care Manager Plan of Care     Problem Identified: Chronic disease managment education (HTN,HLD, Obesity)   Priority: High     Long-Range Goal: Developement of Plan of Care to Address Chronic Disease Management and/or Care Coordination needs.   Start Date: 10/10/2021  Expected End Date: 04/08/2022  Priority: High  Note:   Current Barriers:  Chronic Disease Management support and education needs related to HTN, HLD, and Obesity . History of HTN, HLD. Blood pressure at goal less than 140/90. Patient states he does not have a blood  pressure monitor and chooses not to get a monitor, but will follow up for BP checks at office visits. Last lipids completed 07/05/2021 Normal. Patient states his priority is loosing weight. He would like to loose about 25 pounds.  RNCM Clinical Goal(s):  Patient will verbalize understanding of plan for management of HTN, HLD, and Obesity will demonstrate ongoing self health care management ability .  through collaboration with Consulting civil engineer, provider, and care team.   Interventions: 1:1 collaboration with primary care provider regarding development and update of comprehensive plan of care as evidenced by provider attestation and co-signature Inter-disciplinary care team collaboration (see longitudinal plan of care) Evaluation of current treatment plan related to  self management and patient's adherence to plan as established by provider  Weight Loss Interventions:  Provided verbal and/or written education to patient re: provider recommended life style modifications;  Screening for signs and symptoms of depression;  Assessed social determinant of health barriers;  Discussed lifestyle modifications and changes    Hypertension Interventions: Last practice recorded BP readings:  BP Readings from Last 3 Encounters:  07/05/21 135/78  06/01/21 (!) 148/92  05/23/21 (!) 164/84  Most recent eGFR/CrCl:  Lab Results  Component Value Date   EGFR 76 07/05/2021     Evaluation of current treatment plan related to hypertension self management and patient's adherence to plan as established by provider; Provided education to patient re: stroke prevention, s/s of heart attack and stroke; Reviewed medications with patient and discussed importance of compliance; Discussed plans with patient for ongoing care management follow up and provided patient with direct contact information for care management team; Hyperlipidemia  Interventions: Medication review performed; medication list updated in electronic  medical record.  Provided HLD educational materials; Screening for signs and symptoms of depression related to chronic disease state;  Assessed social determinant of health barriers;   Patient Goals/Self-Care Activities: Patient will self administer medications as prescribed Patient will attend all scheduled provider appointments Patient will call provider office for new concerns or questions Review education material provided. Plan to discuss at next telephone call Swap 1-2 glasses of water for soda every day Try to find another Gym to attend. Continue to walk daily as previously. Eat Heart Healthy: plenty of vegetables, fruits, lean meats, healthy fats.

## 2021-10-10 NOTE — Chronic Care Management (AMB) (Signed)
Chronic Care Management   CCM RN Visit Note  10/10/2021 Name: Lance Anderson MRN: [id number] DOB: 1951-07-12  Subjective: Lance Anderson is a 70 y.o. year old male who is a primary care patient of Samuel Bouche, NP. The care management team was consulted for assistance with disease management and care coordination needs.    Engaged with patient by telephone for initial visit in response to provider referral for case management and/or care coordination services.   Consent to Services:  The patient was given the following information about Chronic Care Management services today, agreed to services, and gave verbal consent: 1. CCM service includes personalized support from designated clinical staff supervised by the primary care provider, including individualized plan of care and coordination with other care providers 2. 24/7 contact phone numbers for assistance for urgent and routine care needs. 3. Service will only be billed when office clinical staff spend 20 minutes or more in a month to coordinate care. 4. Only one practitioner may furnish and bill the service in a calendar month. 5.The patient may stop CCM services at any time (effective at the end of the month) by phone call to the office staff. 6. The patient will be responsible for cost sharing (co-pay) of up to 20% of the service fee (after annual deductible is met). Patient agreed to services and consent obtained.  Patient agreed to services and verbal consent obtained.   Assessment: Review of patient past medical history, allergies, medications, health status, including review of consultants reports, laboratory and other test data, was performed as part of comprehensive evaluation and provision of chronic care management services.   SDOH (Social Determinants of Health) assessments and interventions performed:  SDOH Interventions    Flowsheet Row Most Recent Value  SDOH Interventions   Food Insecurity Interventions Intervention Not  Indicated  Transportation Interventions Intervention Not Indicated        CCM Care Plan  Allergies  Allergen Reactions   Codeine Other (See Comments)    Chest pain   Penicillins Diarrhea    Reaction: 30 years    Outpatient Encounter Medications as of 10/10/2021  Medication Sig   amLODipine (NORVASC) 10 MG tablet TAKE 1 TABLET (10 MG TOTAL) BY MOUTH DAILY.   aspirin 81 MG EC tablet Take 1 tablet (81 mg total) by mouth daily.   atorvastatin (LIPITOR) 40 MG tablet TAKE 1 TABLET (40 MG TOTAL) BY MOUTH DAILY.   FLUoxetine (PROZAC) 20 MG capsule TAKE 1 CAPSULE (20 MG TOTAL) BY MOUTH DAILY.   fluticasone (FLONASE) 50 MCG/ACT nasal spray USE 2 SPRAYS IN EACH NOSTRIL EVERY DAY (NEED MD APPOINTMENT FOR REFILLS)   meloxicam (MOBIC) 15 MG tablet One tab PO qAM with a meal for 2 weeks, then daily prn pain.   terbinafine (LAMISIL) 250 MG tablet Take 250 mg by mouth daily.   Misc. Devices MISC Start CPAP at 13 cm. water pressure with EPR 1.  CPAP machine with a mask and supplies for OSA with AHI 10.  AirFit P10(S) nasal pillow mask or patient preference.  Send to a local DME.  Luna II or first available   predniSONE (DELTASONE) 50 MG tablet Take 1 tablet (50 mg total) by mouth daily.   No facility-administered encounter medications on file as of 10/10/2021.    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   Recurrent major depressive disorder, in full remission ([place]) 02/25/2018    Encounter for long-term (current) use of medications 02/25/2018   Encounter for abdominal aortic aneurysm (AAA) screening 02/25/2018   Dyslipidemia, goal LDL below 100 02/25/2018   Refused influenza vaccine 02/25/2018   Refused pneumococcal vaccination 02/25/2018   Pruritus 08/04/2017   Encounter for monitoring statin therapy 08/04/2017   Depression 05/21/2017   Family history of heart disease in male family member before age 22 05/21/2017   Primary osteoarthritis of left knee 05/21/2017   Class 1 obesity due to excess calories with serious comorbidity in adult 05/21/2017   Allergic rhinitis 05/21/2017   DDD (degenerative disc disease), lumbosacral 05/21/2017   Vasovagal episode 05/21/2017   Essential hypertension with goal blood pressure less than 140/90 08/17/2015   Pseudophakia of both eyes 10/20/2013   Dyslipidemia 08/10/2013   Headache 04/18/2012    Conditions to be addressed/monitored:HTN, HLD, and Obesity  Care Plan : RN Care Manager Plan of Care  Updates made by [name], RN since 10/10/2021 12:00 AM     Problem: Chronic disease managment education (HTN,HLD, Obesity)   Priority: High     Long-Range Goal: Developement of Plan of Care to Address Chronic Disease Management and/or Care Coordination needs.   Start Date: 10/10/2021  Expected End Date: 04/08/2022  Priority: High  Note:   Current Barriers:  Chronic Disease Management support and education needs related to HTN, HLD, and Obesity . History of HTN, HLD. Blood pressure at goal less than 140/90. Patient states he does not have a blood pressure monitor and chooses not to get a monitor, but will follow up for BP checks at office visits. Last lipids completed 07/05/2021 Normal. Patient states his priority is loosing weight. He would like to loose about 25 pounds.  RNCM Clinical Goal(s):  Patient will verbalize understanding of plan for management of HTN, HLD, and Obesity will demonstrate ongoing self health care  management ability .  through collaboration with Consulting civil engineer, provider, and care team.   Interventions: 1:1 collaboration with primary care provider regarding development and update of comprehensive plan of care as evidenced by provider attestation and co-signature Inter-disciplinary care team collaboration (see longitudinal plan of care) Evaluation of current treatment plan related to  self management and patient's adherence to plan as established by provider  Weight Loss Interventions:  Provided verbal and/or written education to patient re: provider recommended life style modifications;  Screening for signs and symptoms of depression;  Assessed social determinant of health barriers;  Discussed lifestyle modifications and changes    Hypertension Interventions: Last practice recorded BP readings:  BP Readings from Last 3 Encounters:  07/05/21 135/78  06/01/21 (!) 148/92  05/23/21 (!) 164/84  Most recent eGFR/CrCl:  Lab Results  Component Value Date   EGFR 76 07/05/2021     Evaluation of current treatment plan related to hypertension self management and patient's adherence to plan as established by provider; Provided education to patient re: stroke prevention, s/s of heart attack and stroke; Reviewed medications with patient and discussed importance of compliance; Discussed plans with patient for ongoing care management follow up and provided patient with direct contact information for care  management team; Hyperlipidemia Interventions: Medication review performed; medication list updated in electronic medical record.  Provided HLD educational materials; Screening for signs and symptoms of depression related to chronic disease state;  Assessed social determinant of health barriers;   Patient Goals/Self-Care Activities: Patient will self administer medications as prescribed Patient will attend all scheduled provider appointments Patient will call provider office for new  concerns or questions Review education material provided. Plan to discuss at next telephone call Swap 1-2 glasses of water for soda every day Try to find another Gym to attend. Continue to walk daily as previously. Eat Heart Healthy: plenty of vegetables, fruits, lean meats, healthy fats.    Plan:Telephone follow up appointment with care management team member scheduled for:  next month The patient has been provided with contact information for the care management team and has been advised to call with any health related questions or concerns.    Thea Silversmith, RN, MSN, BSN, CCM Care Management Coordinator [facility] [phone]

## 2021-10-11 ENCOUNTER — Other Ambulatory Visit: Payer: Self-pay

## 2021-10-11 DIAGNOSIS — F3342 Major depressive disorder, recurrent, in full remission: Secondary | ICD-10-CM

## 2021-10-11 MED ORDER — FLUOXETINE HCL 20 MG PO CAPS: 20.0000 mg | ORAL_CAPSULE | Freq: Every day | ORAL | 0 refills | Status: DC

## 2021-10-22 DIAGNOSIS — I10 Essential (primary) hypertension: Secondary | ICD-10-CM

## 2021-10-22 DIAGNOSIS — E785 Hyperlipidemia, unspecified: Secondary | ICD-10-CM

## 2021-11-07 ENCOUNTER — Ambulatory Visit (INDEPENDENT_AMBULATORY_CARE_PROVIDER_SITE_OTHER): Payer: Medicare Other

## 2021-11-07 DIAGNOSIS — I10 Essential (primary) hypertension: Secondary | ICD-10-CM

## 2021-11-07 DIAGNOSIS — E6609 Other obesity due to excess calories: Secondary | ICD-10-CM

## 2021-11-07 NOTE — Chronic Care Management (AMB) (Signed)
Chronic Care Management   CCM RN Visit Note  11/07/2021 Name: Lance Anderson MRN: [id number] DOB: [birth year]  Subjective: Lance Anderson is a 70 y.o. year old male who is a primary care patient of Samuel Bouche, NP. The care management team was consulted for assistance with disease management and care coordination needs.    Engaged with patient by telephone for follow up visit in response to provider referral for case management and/or care coordination services.   Consent to Services:  The patient was given information about Chronic Care Management services, agreed to services, and gave verbal consent prior to initiation of services.  Please see initial visit note for detailed documentation.   Patient agreed to services and verbal consent obtained.   Assessment: Review of patient past medical history, allergies, medications, health status, including review of consultants reports, laboratory and other test data, was performed as part of comprehensive evaluation and provision of chronic care management services.   SDOH (Social Determinants of Health) assessments and interventions performed:    CCM Care Plan  Allergies  Allergen Reactions   Codeine Other (See Comments)    Chest pain   Penicillins Diarrhea    Reaction: 30 years    Outpatient Encounter Medications as of 11/07/2021  Medication Sig   amLODipine (NORVASC) 10 MG tablet TAKE 1 TABLET (10 MG TOTAL) BY MOUTH DAILY.   aspirin 81 MG EC tablet Take 1 tablet (81 mg total) by mouth daily.   atorvastatin (LIPITOR) 40 MG tablet TAKE 1 TABLET (40 MG TOTAL) BY MOUTH DAILY.   FLUoxetine (PROZAC) 20 MG capsule Take 1 capsule (20 mg total) by mouth daily.   fluticasone (FLONASE) 50 MCG/ACT nasal spray USE 2 SPRAYS IN EACH NOSTRIL EVERY DAY (NEED MD APPOINTMENT FOR REFILLS)   meloxicam (MOBIC) 15 MG tablet One tab PO qAM with a meal for 2 weeks, then daily prn pain.   Misc. Devices MISC Start CPAP at 13 cm. water pressure with EPR 1.   CPAP machine with a mask and supplies for OSA with AHI 10.  AirFit P10(S) nasal pillow mask or patient preference.  Send to a local DME.  Luna II or first available   predniSONE (DELTASONE) 50 MG tablet Take 1 tablet (50 mg total) by mouth daily.   terbinafine (LAMISIL) 250 MG tablet Take 250 mg by mouth daily.   No facility-administered encounter medications on file as of 11/07/2021.    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   Recurrent major depressive disorder, in full remission ([place]) 02/25/2018   Encounter for long-term (current) use of medications 02/25/2018   Encounter for abdominal aortic aneurysm (AAA) screening 02/25/2018   Dyslipidemia, goal LDL below 100 02/25/2018   Refused influenza vaccine 02/25/2018   Refused pneumococcal vaccination 02/25/2018   Pruritus 08/04/2017   Encounter for monitoring statin therapy 08/04/2017   Depression 05/21/2017   Family history of heart disease in male family member before age 81 05/21/2017   Primary osteoarthritis of left knee 05/21/2017   Class 1 obesity  due to excess calories with serious comorbidity in adult 05/21/2017   Allergic rhinitis 05/21/2017   DDD (degenerative disc disease), lumbosacral 05/21/2017   Vasovagal episode 05/21/2017   Essential hypertension with goal blood pressure less than 140/90 08/17/2015   Pseudophakia of both eyes 10/20/2013   Dyslipidemia 08/10/2013   Headache 04/18/2012    Conditions to be  addressed/monitored:HTN, HLD, and Obesity  Care Plan : RN Care Manager Plan of Care  Updates made by [name], RN since 11/07/2021 12:00 AM     Problem: Chronic disease managment education (HTN,HLD, Obesity)   Priority: High     Long-Range Goal: Developement of Plan of Care to Address Chronic Disease Management and/or Care Coordination needs.   Start Date: 10/10/2021  Expected End Date: 04/08/2022  Priority: High  Note:   Current Barriers:  Chronic Disease Management support and education needs related to HTN, HLD, and Obesity . History of HTN, HLD. Blood pressure at goal less than 140/90. Patient states he does not have a blood pressure monitor and chooses not to get a monitor, but will follow up for BP checks at office visits. Last lipids completed 07/05/2021 Normal. Patient states his priority is loosing weight. He would like to loose about 25 pounds. RNCM spoke with Mr. Hurtado who reports he has been busy helping to take care of his mother. And states he has not had time to find a gym. He reports he has lost a couple of pounds and increased his activity some. He states he has increased water and is drinking a few less soda's a day.  RNCM Clinical Goal(s):  Patient will verbalize understanding of plan for management of HTN, HLD, and Obesity will demonstrate ongoing self health care management ability .  through collaboration with Consulting civil engineer, provider, and care team.   Interventions: 1:1 collaboration with primary care provider regarding development and update of comprehensive plan of care as evidenced by provider attestation and co-signature Inter-disciplinary care team collaboration (see longitudinal plan of care) Evaluation of current treatment plan related to  self management and patient's adherence to plan as established by provider  Weight Loss Interventions: Long Term: goal on track Provided verbal and/or written education to patient re: provider recommended life style  modifications;  Discussed lifestyle modifications and changes   Encouraged to continue to work on increasing water intake Discussed Holidays and encouraged to monitor portion sizes Encouraged patient to discuss possible weight loss program at next visit  Hypertension Interventions: Long Term: goal on track Last practice recorded BP readings:  BP Readings from Last 3 Encounters:  07/05/21 135/78  06/01/21 (!) 148/92  05/23/21 (!) 164/84  Most recent eGFR/CrCl:  Lab Results  Component Value Date   EGFR 76 07/05/2021     Evaluation of current treatment plan related to hypertension self management and patient's adherence to plan as established by provider; Reviewed medications with patient and discussed importance of compliance; Discussed plans with patient for ongoing care management follow up and provided patient with direct contact information for care management team; Hyperlipidemia Interventions: Long Term goal on track Medication review performed; medication list updated in electronic medical record.  Provided information on HLD  Patient Goals/Self-Care Activities: Patient will self administer medications as prescribed Patient will attend all scheduled provider appointments Patient will call provider office for new concerns or questions Review education material provided. Plan to discuss at next telephone call Continue to work on increasing water and decreasing soda intake.  Try to find another Gym  to attend. Continue to walk daily as previously. Eat Heart Healthy: plenty of vegetables, fruits, lean meats, healthy fats.    Plan:Telephone follow up appointment with care management team member scheduled for:  next month The patient has been provided with contact information for the care management team and has been advised to call with any health related questions or concerns.   Thea Silversmith, RN, MSN, BSN, CCM Care Management Coordinator [facility] [phone]

## 2021-11-07 NOTE — Patient Instructions (Addendum)
Visit Information: Thank you for taking the time to speak with me today. Please see goals discussed below.  Patient Goals/Self-Care Activities: Patient will self administer medications as prescribed Patient will attend all scheduled provider appointments Patient will call provider office for new concerns or questions Review education material provided. Plan to discuss at next telephone call Continue to work on increasing water and decreasing soda intake.  Try to find another Gym to attend. Continue to walk daily as previously. Eat Heart Healthy: plenty of vegetables, fruits, lean meats, healthy fats.  Preventing Consequences of Unhealthy Weight Loss Behaviors, Adult Reaching and maintaining a healthy weight is important for your overall health. A healthy weight will vary from person to person. It is natural to want to lose weight quickly, using whatever methods seem fastest. However, losing weight in a healthy way is not a quick process. Instead, aim for slow, steady weight loss by making small changes and setting achievable goals. How can unhealthy weight loss behaviors affect me? Using unhealthy behaviors to try to lose weight can cause: Tiredness (fatigue), low heart rate, and low blood pressure. Imbalances in your body. These may be imbalances in: Electrolytes. These are salts and minerals in your blood. Chemicals. These are needed so your body can work properly. Body fluids. Loss of fluids may lead to dehydration. Organ damage or organ failure, especially affecting the kidneys. Thin bones that break easily. Loneliness or relationship problems with your friends and family. Emotional problems, including depression and anxiety. Changing unhealthy weight loss behaviors through lifestyle changes will improve your overall health. Maintaining a healthy weight also lowers your risk of certain conditions, such as: Obesity. Heart disease, high cholesterol, and high blood pressure. Type 2  diabetes. Breathing and sleeping disorders. Stroke. Osteoarthritis. This affects your joints. Osteoporosis. This affects your bones. Some cancers. What can increase my risk? Certain views or feelings about yourself and certain habits can increase your risk of unhealthy weight loss behaviors. These include: Having depression and being overweight as an adult. Attempting weight loss as a child or teen. Using alcohol, drugs, or tobacco products. What actions can I take to prevent these behaviors? You can make certain lifestyle changes to help you lose weight in a healthy way. These include eating nutritious foods and exercising regularly. Nutrition  Eat a variety of healthy foods, including fruits and vegetables, whole grains, lean proteins, and low-fat dairy products. Drink water instead of sugary drinks such as juice, soda, or sports drinks. Drink enough fluid to keep your urine pale yellow. Plan healthy, balanced meals. Work with a Data processing manager to make a healthy meal plan that works for you. Limit the following: Foods that are high in fat, salt (sodium), or sugar. These include candy, donuts, pizza, and fast foods. Fried or heavily processed foods. Lifestyle Avoid these unhealthy eating habits: Following a diet that restricts entire types of food. This may be a popular diet that promises extreme results in a short time. Skipping meals to save calories. Not eating anything for long periods of time (fasting). Restricting your calories to far fewer than the number that you need to lose or maintain a healthy weight. Taking laxative pills to make you have more frequent bowel movements. Taking medicines to make your body lose excess fluids (diuretics). Eating an excessive amount of food and then making yourself vomit. This is known as bingeing and purging. Do not use any products that contain nicotine or tobacco. These products include cigarettes, chewing tobacco, and vaping devices, such as  e-cigarettes. If you need help quitting, ask your health care provider. Alcohol use Do not drink alcohol if: Your health care provider tells you not to drink. You are pregnant, may be pregnant, or are planning to become pregnant. If you drink alcohol: Limit how much you have to: 0-1 drink a day for women. 0-2 drinks a day for men. Know how much alcohol is in your drink. In the [place] one drink equals one 12 oz bottle of beer (355 mL), one 5 oz glass of wine (148 mL), or one 1 oz glass of hard liquor (44 mL). Activity  Avoid compulsively getting an extreme amount of exercise. Work with a Data processing manager to make a healthy exercise program. Include different types of exercise in your exercise program, such as strengthening, aerobic, and flexibility exercises. To maintain your weight, get at least 150 minutes of moderate-intensity exercise each week. Moderate-intensity exercise could be brisk walking or biking. To lose a healthy amount of weight, get 60 minutes of moderate-intensity exercise each day. Find ways to reduce stress, such as regular exercise or meditation. Find a hobby or other activity that you enjoy to distract you from eating when you feel stressed or bored. Where to find support For more support, talk with: Your health care provider or dietitian. Ask about support groups. A mental health care provider. Family and friends. Where to find more information Learn more about how to prevent complications from unhealthy weight loss behaviors from: Centers for Disease Control and Prevention: FootballExhibition.com.br [employer] of Mental Health: http://www.maynard.net/ National Eating Disorders Association: www.nationaleatingdisorders.org Contact a health care provider if: You often feel very tired. You notice changes in your skin or your hair. You faint because of dehydration or too much exercise. You struggle to change your unhealthy weight loss behaviors on your own. Unhealthy weight loss  behaviors are affecting your daily life or your relationships. You have signs or symptoms of an eating disorder. You have major weight changes in a short period of time. You feel guilty or ashamed about eating or exercising. Summary Using unhealthy eating behaviors to try to lose weight can cause a variety of physical and emotional problems that affect your overall health and well-being. Aim for slow, steady weight loss by choosing healthy foods, avoiding unhealthy eating habits, and exercising regularly. Contact your health care provider if you struggle to change your behaviors on your own or if you think that you may have an eating disorder. This information is not intended to replace advice given to you by your health care provider. Make sure you discuss any questions you have with your health care provider. Document Revised: 07/24/2021 Document Reviewed: 07/24/2021 Elsevier Patient Education  2022 Elsevier Inc.   Patient verbalizes understanding of instructions provided today and agrees to view in [employer] [facility].   Telephone follow up appointment with care management team member scheduled for: 12/05/21 The patient has been provided with contact information for the care management team and has been advised to call with any health related questions or concerns.   Kathyrn Sheriff, RN, MSN, BSN, CCM Care Management Coordinator [facility] [phone]

## 2021-11-14 DIAGNOSIS — Z96612 Presence of left artificial shoulder joint: Secondary | ICD-10-CM | POA: Insufficient documentation

## 2021-11-21 DIAGNOSIS — I10 Essential (primary) hypertension: Secondary | ICD-10-CM

## 2021-11-21 DIAGNOSIS — E785 Hyperlipidemia, unspecified: Secondary | ICD-10-CM | POA: Diagnosis not present

## 2021-12-05 ENCOUNTER — Ambulatory Visit: Payer: Medicare Other

## 2021-12-05 DIAGNOSIS — I10 Essential (primary) hypertension: Secondary | ICD-10-CM

## 2021-12-05 DIAGNOSIS — E6609 Other obesity due to excess calories: Secondary | ICD-10-CM

## 2021-12-05 NOTE — Chronic Care Management (AMB) (Signed)
Chronic Care Management   CCM RN Visit Note  12/05/2021 Name: Lance Anderson MRN: [id number] DOB: 1951-09-25  Subjective: Lance Anderson is a 70 y.o. year old male who is a primary care patient of Lance Bouche, NP. The care management team was consulted for assistance with disease management and care coordination needs.    Engaged with patient by telephone for follow up visit in response to provider referral for case management and/or care coordination services.   Consent to Services:  The patient was given information about Chronic Care Management services, agreed to services, and gave verbal consent prior to initiation of services.  Please see initial visit note for detailed documentation.   Patient agreed to services and verbal consent obtained.   Assessment: Review of patient past medical history, allergies, medications, health status, including review of consultants reports, laboratory and other test data, was performed as part of comprehensive evaluation and provision of chronic care management services.   SDOH (Social Determinants of Health) assessments and interventions performed:    CCM Care Plan  Allergies  Allergen Reactions   Codeine Other (See Comments)    Chest pain   Penicillins Diarrhea    Reaction: 30 years    Outpatient Encounter Medications as of 12/05/2021  Medication Sig   amLODipine (NORVASC) 10 MG tablet TAKE 1 TABLET (10 MG TOTAL) BY MOUTH DAILY.   aspirin 81 MG EC tablet Take 1 tablet (81 mg total) by mouth daily.   atorvastatin (LIPITOR) 40 MG tablet TAKE 1 TABLET (40 MG TOTAL) BY MOUTH DAILY.   FLUoxetine (PROZAC) 20 MG capsule Take 1 capsule (20 mg total) by mouth daily.   fluticasone (FLONASE) 50 MCG/ACT nasal spray USE 2 SPRAYS IN EACH NOSTRIL EVERY DAY (NEED MD APPOINTMENT FOR REFILLS)   meloxicam (MOBIC) 15 MG tablet One tab PO qAM with a meal for 2 weeks, then daily prn pain.   Misc. Devices MISC Start CPAP at 13 cm. water pressure with EPR 1.   CPAP machine with a mask and supplies for OSA with AHI 10.  AirFit P10(S) nasal pillow mask or patient preference.  Send to a local DME.  [facility]una II or first available   predniSONE (DELTASONE) 50 MG tablet Take 1 tablet (50 mg total) by mouth daily.   terbinafine (LAMISIL) 250 MG tablet Take 250 mg by mouth daily.   No facility-administered encounter medications on file as of 12/05/2021.    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   Recurrent major depressive disorder, in full remission ([place]) 02/25/2018   Encounter for long-term (current) use of medications 02/25/2018   Encounter for abdominal aortic aneurysm (AAA) screening 02/25/2018   Dyslipidemia, goal LDL below 100 02/25/2018   Refused influenza vaccine 02/25/2018   Refused pneumococcal vaccination 02/25/2018   Pruritus 08/04/2017   Encounter for monitoring statin therapy 08/04/2017   Depression 05/21/2017   Family history of heart disease in male family member before age 35 05/21/2017   Primary osteoarthritis of left knee 05/21/2017   Class 1 obesity  due to excess calories with serious comorbidity in adult 05/21/2017   Allergic rhinitis 05/21/2017   DDD (degenerative disc disease), lumbosacral 05/21/2017   Vasovagal episode 05/21/2017   Essential hypertension with goal blood pressure less than 140/90 08/17/2015   Pseudophakia of both eyes 10/20/2013   Dyslipidemia 08/10/2013   Headache 04/18/2012    Conditions to be  addressed/monitored:HTN, HLD, and Obesity  Care Plan : RN Care Manager Plan of Care  Updates made by Lance Rued, RN since 12/05/2021 12:00 AM     Problem: Chronic disease managment education (HTN,HLD, Obesity)   Priority: High     Long-Range Goal: Developement of Plan of Care to Address Chronic Disease Management and/or Care Coordination needs.   Start Date: 10/10/2021  Expected End Date: 04/08/2022  Priority: High  Note:   Current Barriers:  Chronic Disease Management support and education needs related to HTN, HLD, and Obesity . History of HTN, HLD. Blood pressure at goal less than 140/90. Patient states he does not have a blood pressure monitor and chooses not to get a monitor, but will follow up for BP checks at office visits. Last lipids completed 07/05/2021 Normal. Patient states his priority is loosing weight. He would like to loose about 25 pounds. RNCM spoke with Lance Anderson who reports he has been busy helping to take care of his mother. And states he has not had time to find a gym. He reports he has lost a couple of pounds and increased his activity some. He states he has increased water and is drinking a few less soda's a day.  RNCM Clinical Goal(s):  Patient will verbalize understanding of plan for management of HTN, HLD, and Obesity will demonstrate ongoing self health care management ability .  through collaboration with Consulting civil engineer, provider, and care team.   Interventions: 1:1 collaboration with primary care provider regarding development and update of comprehensive plan of care as evidenced by provider attestation and co-signature Inter-disciplinary care team collaboration (see longitudinal plan of care) Evaluation of current treatment plan related to  self management and patient's adherence to plan as established by provider  Weight Loss Interventions: Long Term: goal not on track Discussed lifestyle modifications and changes   Encouraged to continue to work on  increasing water intake Discussed the Holidays and encouraged Lance Anderson to limit foods processed food, foods high and sugar and saturated fats and monitor portion sizes Encouraged patient to ask about a weight loss program at next primary care provider visit Discussed setting goals Encouraged to continue to walk daily  Hypertension Interventions: Long Term: goal on track Reports BP taken at dentist office 140/85 last week Last practice recorded BP readings:  BP Readings from Last 3 Encounters:  07/05/21 135/78  06/01/21 (!) 148/92  05/23/21 (!) 164/84  Most recent eGFR/CrCl:  Lab Results  Component Value Date   EGFR 76 07/05/2021     Evaluation of current treatment plan related to hypertension self management and patient's adherence to plan as established by provider; Reviewed medications with patient and discussed importance of compliance; Discussed plans with patient for ongoing care management follow up and provided patient with direct contact information for care management team;  Hyperlipidemia Interventions: Long Term goal on track Medication review performed; medication list updated in electronic medical record.  Reviewed upcoming appointments next month with PCP  Patient Goals/Self-Care Activities: Patient will self administer medications as prescribed Patient will attend all scheduled provider appointments Patient will call provider office for new concerns or  questions Eat Heart Healthy: plenty of vegetables, fruits, lean meats, healthy fats Continue to work on increasing water and decreasing soda intake-replace at least 1-2 soda's you usually drink a day with water  Try to find another Gym to attend. Continue to walk daily as previously Discuss a possible referral to a weight loss program with your primary care provider at next office visit   Plan:Telephone follow up appointment with care management team member scheduled for:  01/09/22 The patient has been provided with  contact information for the care management team and has been advised to call with any health related questions or concerns.   Thea Silversmith, RN, MSN, BSN, CCM Care Management Coordinator [facility] [phone]

## 2021-12-05 NOTE — Patient Instructions (Addendum)
Visit Information  Thank you for taking time to visit with me today. Please don't hesitate to contact me if I can be of assistance to you before our next scheduled telephone appointment.  Following are the goals we discussed today:  Patient Goals/Self-Care Activities: Patient will self administer medications as prescribed Patient will attend all scheduled provider appointments Patient will call provider office for new concerns or questions Eat Heart Healthy: plenty of vegetables, fruits, lean meats, healthy fats Continue to work on increasing water and decreasing soda intake-replace at least 1-2 soda's you usually drink a day with water  Try to find another Gym to attend. Continue to walk daily as previously Discuss a possible referral to a weight loss program with your primary care provider at next office visit  Our next appointment is by telephone on 01/09/22 at 9:00 am  Please call the care guide team at [phone] if you need to cancel or reschedule your appointment.   If you are experiencing a Mental Health or Behavioral Health Crisis or need someone to talk to, please call the Suicide and Crisis Lifeline: 988 call 1-800-273-TALK (toll free, 24 hour hotline)   Patient verbalizes understanding of instructions provided today and agrees to view in MyChart.   Kathyrn Sheriff, RN, MSN, BSN, CCM Care Management Coordinator [facility] [phone]   DASH Eating Plan DASH stands for Dietary Approaches to Stop Hypertension. The DASH eating plan is a healthy eating plan that has been shown to: Reduce high blood pressure (hypertension). Reduce your risk for type 2 diabetes, heart disease, and stroke. Help with weight loss. What are tips for following this plan? Reading food labels Check food labels for the amount of salt (sodium) per serving. Choose foods with less than 5 percent of the Daily Value of sodium. Generally, foods with less than 300 milligrams (mg) of sodium per  serving fit into this eating plan. To find whole grains, look for the word "whole" as the first word in the ingredient list. Shopping Buy products labeled as "low-sodium" or "no salt added." Buy fresh foods. Avoid canned foods and pre-made or frozen meals. Cooking Avoid adding salt when cooking. Use salt-free seasonings or herbs instead of table salt or sea salt. Check with your health care provider or pharmacist before using salt substitutes. Do not fry foods. Cook foods using healthy methods such as baking, boiling, grilling, roasting, and broiling instead. Cook with heart-healthy oils, such as olive, canola, avocado, soybean, or sunflower oil. Meal planning  Eat a balanced diet that includes: 4 or more servings of fruits and 4 or more servings of vegetables each day. Try to fill one-half of your plate with fruits and vegetables. 6-8 servings of whole grains each day. Less than 6 oz (170 g) of lean meat, poultry, or fish each day. A 3-oz (85-g) serving of meat is about the same size as a deck of cards. One egg equals 1 oz (28 g). 2-3 servings of low-fat dairy each day. One serving is 1 cup (237 mL). 1 serving of nuts, seeds, or beans 5 times each week. 2-3 servings of heart-healthy fats. Healthy fats called omega-3 fatty acids are found in foods such as walnuts, flaxseeds, fortified milks, and eggs. These fats are also found in cold-water fish, such as sardines, salmon, and mackerel. Limit how much you eat of: Canned or prepackaged foods. Food that is high in trans fat, such as some fried foods. Food that is high in saturated fat, such as fatty meat. Desserts and  other sweets, sugary drinks, and other foods with added sugar. Full-fat dairy products. Do not salt foods before eating. Do not eat more than 4 egg yolks a week. Try to eat at least 2 vegetarian meals a week. Eat more home-cooked food and less restaurant, buffet, and fast food. Lifestyle When eating at a restaurant, ask that  your food be prepared with less salt or no salt, if possible. If you drink alcohol: Limit how much you use to: 0-1 drink a day for women who are not pregnant. 0-2 drinks a day for men. Be aware of how much alcohol is in your drink. In the [place] one drink equals one 12 oz bottle of beer (355 mL), one 5 oz glass of wine (148 mL), or one 1 oz glass of hard liquor (44 mL). General information Avoid eating more than 2,300 mg of salt a day. If you have hypertension, you may need to reduce your sodium intake to 1,500 mg a day. Work with your health care provider to maintain a healthy body weight or to lose weight. Ask what an ideal weight is for you. Get at least 30 minutes of exercise that causes your heart to beat faster (aerobic exercise) most days of the week. Activities may include walking, swimming, or biking. Work with your health care provider or dietitian to adjust your eating plan to your individual calorie needs. What foods should I eat? Fruits All fresh, dried, or frozen fruit. Canned fruit in natural juice (without added sugar). Vegetables Fresh or frozen vegetables (raw, steamed, roasted, or grilled). Low-sodium or reduced-sodium tomato and vegetable juice. Low-sodium or reduced-sodium tomato sauce and tomato paste. Low-sodium or reduced-sodium canned vegetables. Grains Whole-grain or whole-wheat bread. Whole-grain or whole-wheat pasta. Brown rice. Orpah Cobb. Bulgur. Whole-grain and low-sodium cereals. Pita bread. Low-fat, low-sodium crackers. Whole-wheat flour tortillas. Meats and other proteins Skinless chicken or [place]. Ground chicken or [place]. Pork with fat trimmed off. Fish and seafood. Egg whites. Dried beans, peas, or lentils. Unsalted nuts, nut butters, and seeds. Unsalted canned beans. Lean cuts of beef with fat trimmed off. Low-sodium, lean precooked or cured meat, such as sausages or meat loaves. Dairy Low-fat (1%) or fat-free (skim) milk. Reduced-fat, low-fat, or  fat-free cheeses. Nonfat, low-sodium ricotta or cottage cheese. Low-fat or nonfat yogurt. Low-fat, low-sodium cheese. Fats and oils Soft margarine without trans fats. Vegetable oil. Reduced-fat, low-fat, or light mayonnaise and salad dressings (reduced-sodium). Canola, safflower, olive, avocado, soybean, and sunflower oils. Avocado. Seasonings and condiments Herbs. Spices. Seasoning mixes without salt. Other foods Unsalted popcorn and pretzels. Fat-free sweets. The items listed above may not be a complete list of foods and beverages you can eat. Contact a dietitian for more information. What foods should I avoid? Fruits Canned fruit in a light or heavy syrup. Fried fruit. Fruit in cream or butter sauce. Vegetables Creamed or fried vegetables. Vegetables in a cheese sauce. Regular canned vegetables (not low-sodium or reduced-sodium). Regular canned tomato sauce and paste (not low-sodium or reduced-sodium). Regular tomato and vegetable juice (not low-sodium or reduced-sodium). Rosita Fire. Olives. Grains Baked goods made with fat, such as croissants, muffins, or some breads. Dry pasta or rice meal packs. Meats and other proteins Fatty cuts of meat. Ribs. Fried meat. Tomasa Blase. Bologna, salami, and other precooked or cured meats, such as sausages or meat loaves. Fat from the back of a pig (fatback). Bratwurst. Salted nuts and seeds. Canned beans with added salt. Canned or smoked fish. Whole eggs or egg yolks. Chicken or [place] with  skin. Dairy Whole or 2% milk, cream, and half-and-half. Whole or full-fat cream cheese. Whole-fat or sweetened yogurt. Full-fat cheese. Nondairy creamers. Whipped toppings. Processed cheese and cheese spreads. Fats and oils Butter. Stick margarine. Lard. Shortening. Ghee. Bacon fat. Tropical oils, such as coconut, palm kernel, or palm oil. Seasonings and condiments Onion salt, garlic salt, seasoned salt, table salt, and sea salt. Worcestershire sauce. Tartar sauce. Barbecue  sauce. Teriyaki sauce. Soy sauce, including reduced-sodium. Steak sauce. Canned and packaged gravies. Fish sauce. Oyster sauce. Cocktail sauce. Store-bought horseradish. Ketchup. Mustard. Meat flavorings and tenderizers. Bouillon cubes. Hot sauces. Pre-made or packaged marinades. Pre-made or packaged taco seasonings. Relishes. Regular salad dressings. Other foods Salted popcorn and pretzels. The items listed above may not be a complete list of foods and beverages you should avoid. Contact a dietitian for more information. Where to find more information National Heart, Lung, and Blood Institute: PopSteam.is American Heart Association: www.heart.org Academy of Nutrition and Dietetics: www.eatright.org National Kidney Foundation: www.kidney.org Summary The DASH eating plan is a healthy eating plan that has been shown to reduce high blood pressure (hypertension). It may also reduce your risk for type 2 diabetes, heart disease, and stroke. When on the DASH eating plan, aim to eat more fresh fruits and vegetables, whole grains, lean proteins, low-fat dairy, and heart-healthy fats. With the DASH eating plan, you should limit salt (sodium) intake to 2,300 mg a day. If you have hypertension, you may need to reduce your sodium intake to 1,500 mg a day. Work with your health care provider or dietitian to adjust your eating plan to your individual calorie needs. This information is not intended to replace advice given to you by your health care provider. Make sure you discuss any questions you have with your health care provider. Document Revised: 11/12/2019 Document Reviewed: 11/12/2019 Elsevier Patient Education  2022 [employer].

## 2022-01-07 ENCOUNTER — Encounter: Payer: Self-pay | Admitting: Medical-Surgical

## 2022-01-07 ENCOUNTER — Ambulatory Visit (INDEPENDENT_AMBULATORY_CARE_PROVIDER_SITE_OTHER): Payer: Medicare Other | Admitting: Medical-Surgical

## 2022-01-07 ENCOUNTER — Other Ambulatory Visit: Payer: Self-pay

## 2022-01-07 VITALS — BP 138/76 | HR 62 | Resp 20 | Ht 71.0 in | Wt 249.0 lb

## 2022-01-07 DIAGNOSIS — E6609 Other obesity due to excess calories: Secondary | ICD-10-CM

## 2022-01-07 DIAGNOSIS — I10 Essential (primary) hypertension: Secondary | ICD-10-CM

## 2022-01-07 DIAGNOSIS — M51379 Other intervertebral disc degeneration, lumbosacral region without mention of lumbar back pain or lower extremity pain: Secondary | ICD-10-CM

## 2022-01-07 DIAGNOSIS — E785 Hyperlipidemia, unspecified: Secondary | ICD-10-CM | POA: Diagnosis not present

## 2022-01-07 DIAGNOSIS — J309 Allergic rhinitis, unspecified: Secondary | ICD-10-CM | POA: Diagnosis not present

## 2022-01-07 DIAGNOSIS — Z5181 Encounter for therapeutic drug level monitoring: Secondary | ICD-10-CM

## 2022-01-07 DIAGNOSIS — Z79899 Other long term (current) drug therapy: Secondary | ICD-10-CM

## 2022-01-07 DIAGNOSIS — M159 Polyosteoarthritis, unspecified: Secondary | ICD-10-CM

## 2022-01-07 DIAGNOSIS — F3342 Major depressive disorder, recurrent, in full remission: Secondary | ICD-10-CM

## 2022-01-07 DIAGNOSIS — M150 Primary generalized (osteo)arthritis: Secondary | ICD-10-CM

## 2022-01-07 DIAGNOSIS — M5137 Other intervertebral disc degeneration, lumbosacral region: Secondary | ICD-10-CM | POA: Diagnosis not present

## 2022-01-07 DIAGNOSIS — I77811 Abdominal aortic ectasia: Secondary | ICD-10-CM | POA: Diagnosis not present

## 2022-01-07 DIAGNOSIS — N289 Disorder of kidney and ureter, unspecified: Secondary | ICD-10-CM

## 2022-01-07 LAB — CBC WITH DIFFERENTIAL/PLATELET
Absolute Monocytes: 595 cells/uL (ref 200–950)
Basophils Absolute: 63 cells/uL (ref 0–200)
Basophils Relative: 0.9 %
Eosinophils Absolute: 231 cells/uL (ref 15–500)
Eosinophils Relative: 3.3 %
HCT: 41.7 % (ref 38.5–50.0)
Hemoglobin: 14 g/dL (ref 13.2–17.1)
Lymphs Abs: 1897 cells/uL (ref 850–3900)
MCH: 31.4 pg (ref 27.0–33.0)
MCHC: 33.6 g/dL (ref 32.0–36.0)
MCV: 93.5 fL (ref 80.0–100.0)
MPV: 11.1 fL (ref 7.5–12.5)
Monocytes Relative: 8.5 %
Neutro Abs: 4214 cells/uL (ref 1500–7800)
Neutrophils Relative %: 60.2 %
Platelets: 250 10*3/uL (ref 140–400)
RBC: 4.46 10*6/uL (ref 4.20–5.80)
RDW: 12.3 % (ref 11.0–15.0)
Total Lymphocyte: 27.1 %
WBC: 7 10*3/uL (ref 3.8–10.8)

## 2022-01-07 LAB — COMPLETE METABOLIC PANEL WITH GFR
AG Ratio: 1.6 (calc) (ref 1.0–2.5)
ALT: 20 U/L (ref 9–46)
AST: 19 U/L (ref 10–35)
Albumin: 4.2 g/dL (ref 3.6–5.1)
Alkaline phosphatase (APISO): 81 U/L (ref 35–144)
BUN/Creatinine Ratio: 29 (calc) — ABNORMAL HIGH (ref 6–22)
BUN: 32 mg/dL — ABNORMAL HIGH (ref 7–25)
CO2: 28 mmol/L (ref 20–32)
Calcium: 8.8 mg/dL (ref 8.6–10.3)
Chloride: 107 mmol/L (ref 98–110)
Creat: 1.12 mg/dL (ref 0.70–1.35)
Globulin: 2.6 g/dL (calc) (ref 1.9–3.7)
Glucose, Bld: 106 mg/dL — ABNORMAL HIGH (ref 65–99)
Potassium: 4.4 mmol/L (ref 3.5–5.3)
Sodium: 139 mmol/L (ref 135–146)
Total Bilirubin: 0.5 mg/dL (ref 0.2–1.2)
Total Protein: 6.8 g/dL (ref 6.1–8.1)
eGFR: 72 mL/min/{1.73_m2} (ref >=60)

## 2022-01-07 LAB — LIPID PANEL
Cholesterol: 157 mg/dL (ref <200)
HDL: 60 mg/dL (ref >=40)
LDL Cholesterol (Calc): 83 mg/dL (calc)
Non-HDL Cholesterol (Calc): 97 mg/dL (calc) (ref <130)
Total CHOL/HDL Ratio: 2.6 (calc) (ref <5.0)
Triglycerides: 65 mg/dL (ref <150)

## 2022-01-07 NOTE — Patient Instructions (Signed)

## 2022-01-07 NOTE — Progress Notes (Signed)
° °  Established patient office visit  HPI with pertinent ROS:   CC: Chronic disease follow-up  HPI: Very pleasant 71 year old male presenting today for follow-up on:  Hypertension-taking amlodipine 10 mg daily, tolerating well without side effects.  Not regularly checking blood pressures at home.  Does eat fast food at breakfast periodically but otherwise tries to limit sodium in his diet.  Activity limited by osteoarthritis and knee pain but does try to remain active. Denies CP, SOB, palpitations, lower extremity edema, dizziness, headaches, or vision changes.   Abdominal aortic ectasia-denies abdominal/flank/back pain, early satiety, nausea/vomiting, issues with low blood pressure, leg weakness, dizziness, and syncopal episodes.  Allergic rhinitis-using Flonase daily as directed.  Currently feels like this is well managed.  Uses OTC antihistamine on an as-needed basis.  Renal insufficiency-no change in voiding pattern or amount.  No changes in urine color or consistency.  Dyslipidemia-taking Lipitor 40 mg daily, tolerating well without side effects.  Following a low-fat heart healthy diet plan most of the time.  Mood-taking fluoxetine 20 mg daily, tolerating well without side effects.  Feels that mood is very stable.  Denies SI/HI.  DDD/OA-using meloxicam 15 mg daily as needed but does report taking this most days to help with shoulder and knee pain.  Is interested in possible Orthovisc/Synvisc for his knee pain and would like more information on this.  I reviewed the past medical history, family history, social history, surgical history, and allergies today and no changes were needed.  Please see the problem list section below in epic for further details.   Brief exam, Assessment, and Plan:   Today's Vitals: BP 138/76    Pulse 62    Resp 20    Ht 5\' 11"  (1.803 m)    Wt 249 lb (112.9 kg)    SpO2 98%    BMI 34.73 kg/m   1. Essential hypertension with goal blood pressure less than  140/90 Checking labs as below.  Blood pressure on recheck at goal.  Continue amlodipine 10 mg daily.  Reviewed recommendations for low-sodium diet and regular intentional exercise. - CBC with Differential - COMPLETE METABOLIC PANEL WITH GFR - Lipid panel  2. Allergic rhinitis, unspecified seasonality, unspecified trigger Well managed.  Continue Flonase and over-the-counter antihistamine as needed.  3. Abdominal aortic ectasia (HCC) Currently asymptomatic.  Plan to continue adequate management of blood pressure and monitor for concerning symptoms.  4. Renal insufficiency Checking CMP today. - COMPLETE METABOLIC PANEL WITH GFR  5. Dyslipidemia Checking lipid panel.  Continue atorvastatin 40 mg daily as prescribed. - Lipid panel  6. Recurrent major depressive disorder, in full remission (HCC) Stable.  Continue fluoxetine 20 mg daily.  7. Encounter for monitoring statin therapy Checking liver function and cholesterol. - COMPLETE METABOLIC PANEL WITH GFR - Lipid panel  8. DDD (degenerative disc disease), lumbosacral 9. Primary osteoarthritis involving multiple joints Continue meloxicam 15 mg daily.  10. Class 1 obesity due to excess calories with serious comorbidity in adult, unspecified BMI Recommend weight loss to healthy weight and increase in intentional activity.  Return in about 6 months (around 07/07/2022) for chronic disease follow up. ___________________________________________ 07/09/2022, DNP, APRN, FNP-BC Primary Care and Sports Medicine [facility] [place]

## 2022-01-09 ENCOUNTER — Telehealth: Payer: Medicare Other

## 2022-01-13 IMAGING — DX DG KNEE COMPLETE 4+V*L*
5 series · 5 of 5 positions shown · non-contrast
Comparison: None.

CLINICAL DATA: Primary osteoarthritis left knee. Pain. Knee
injection today

EXAM:
LEFT KNEE - COMPLETE 4+ VIEW

[tunnel]
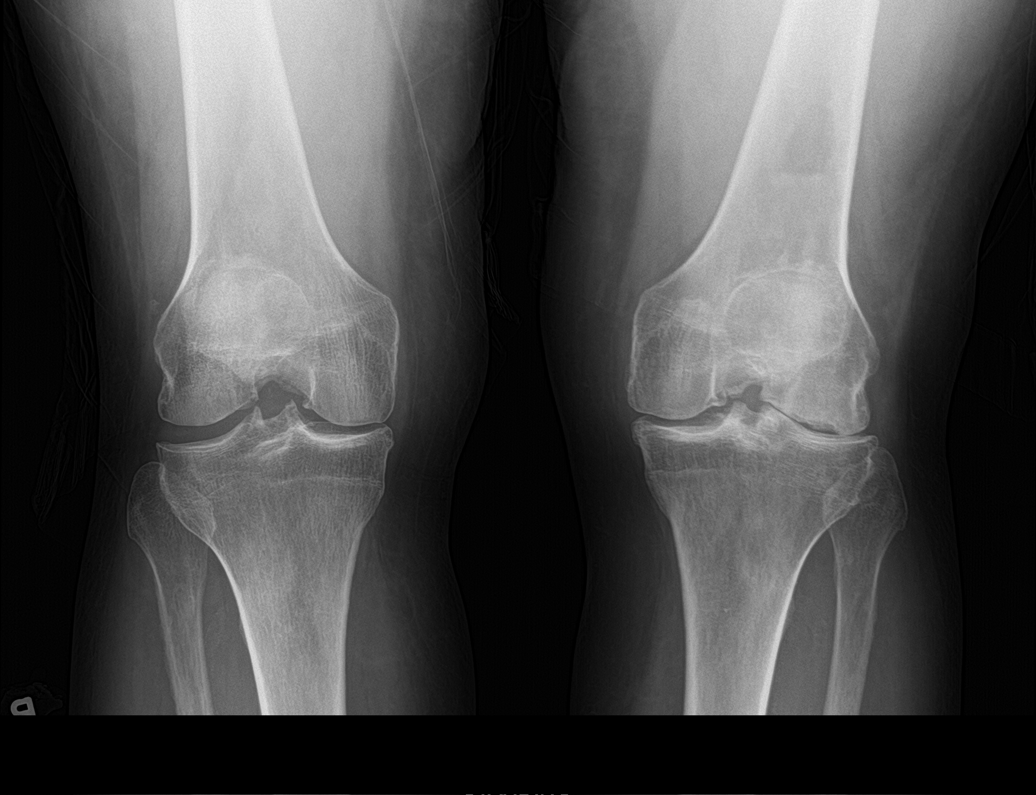

[knee lat]
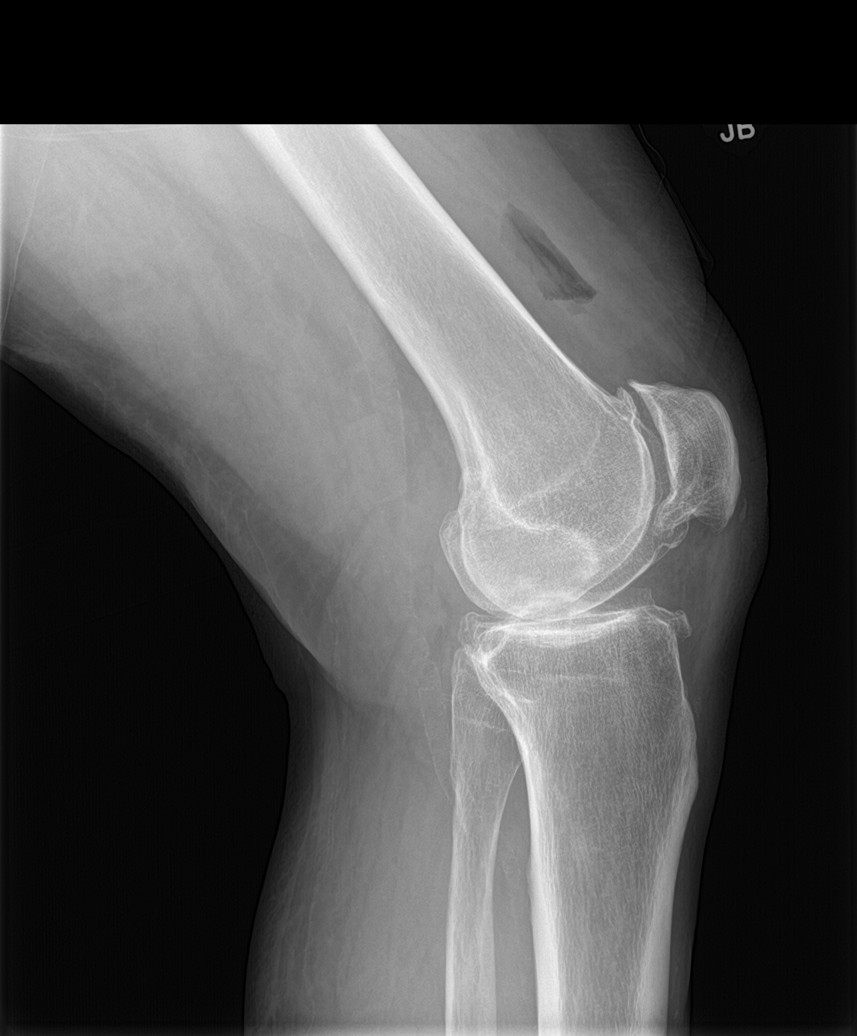

[knee sunrise]
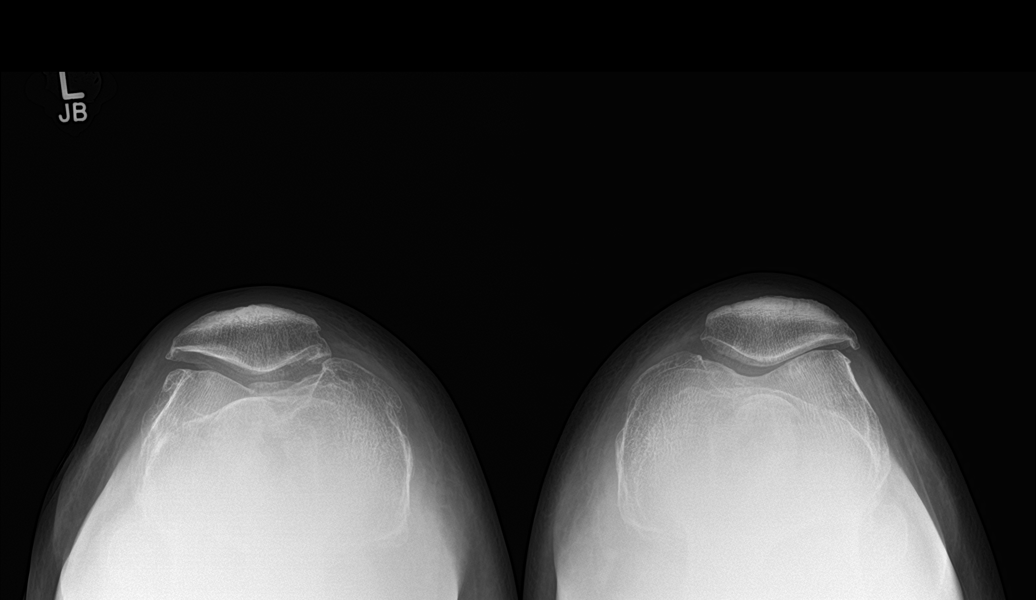

[knee ap bilat standing (1 of 2)]
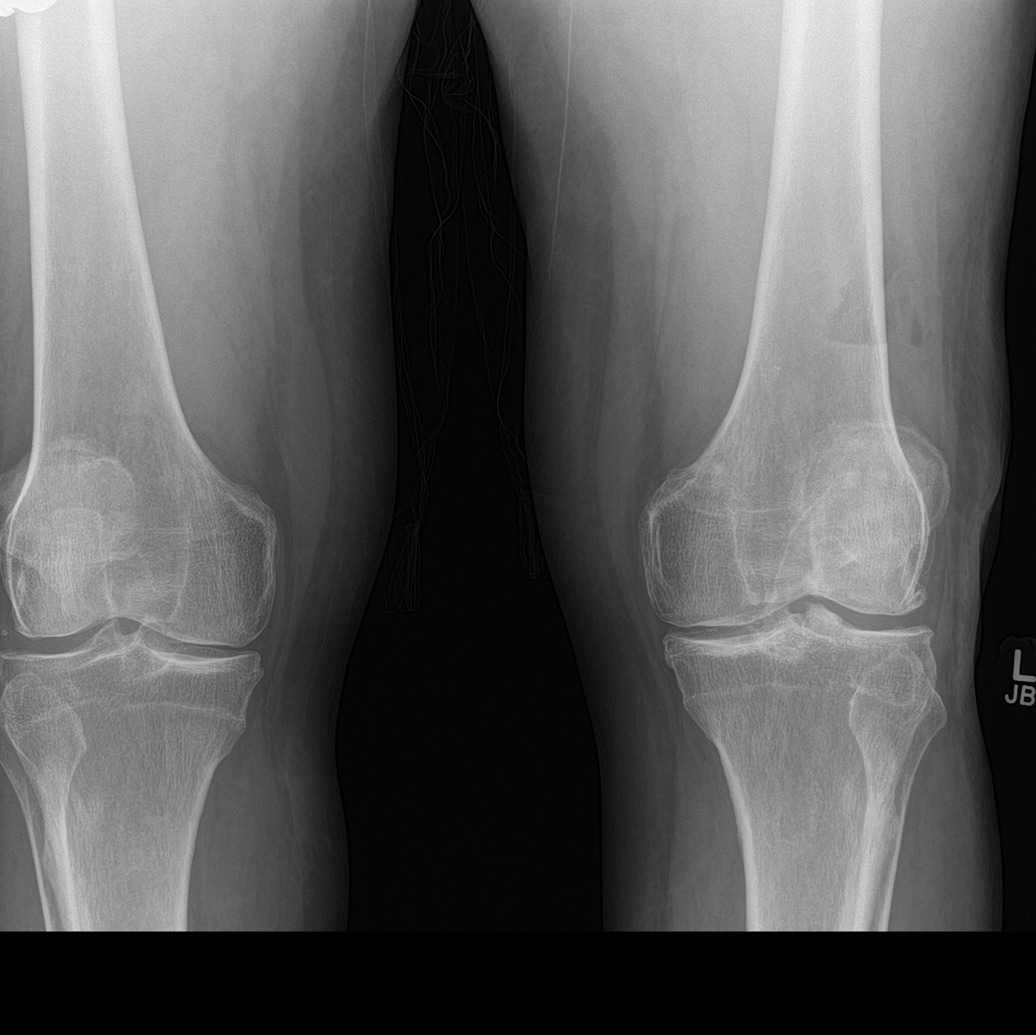

[knee ap bilat standing (2 of 2)]
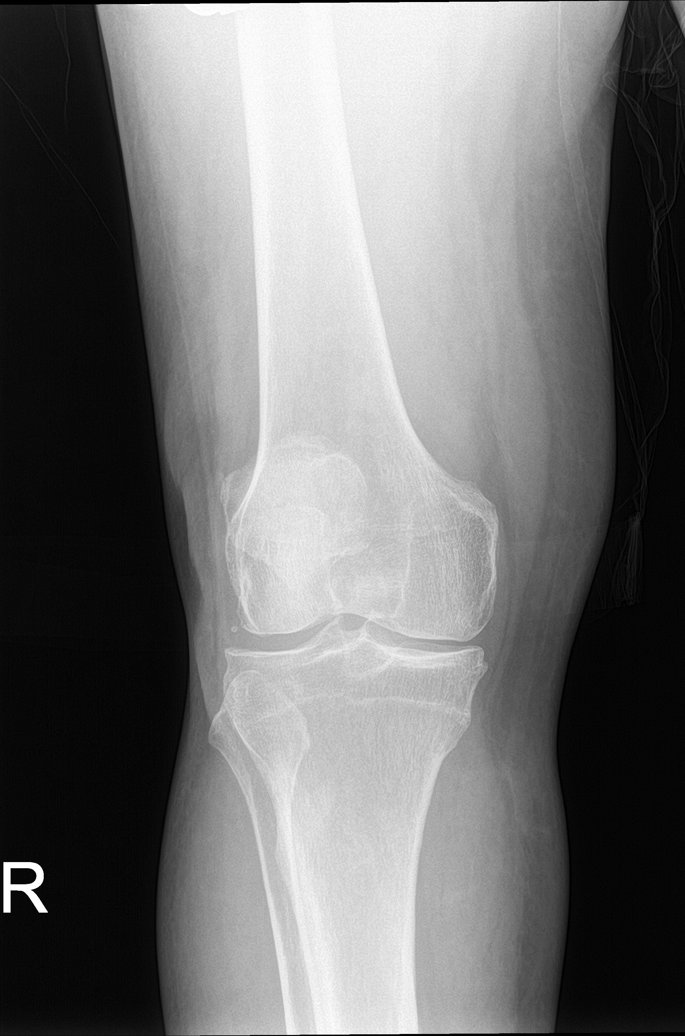

[5 of 5 positions shown; findings below may reference images not displayed]

FINDINGS: Mild to moderate joint space narrowing and spurring in the
mediolateral joint compartment. Moderate degenerative change and
spurring in the patellofemoral joint. Osteochondral defect in the
lateral femoral condyle which appears chronic. No loose body

Joint effusion containing gas due to injection in the knee. Early
arterial calcification
IMPRESSION: Moderate osteoarthritis in the knee. Osteochondral defect in the
lateral femoral condyle.

## 2022-02-05 ENCOUNTER — Telehealth: Payer: Self-pay | Admitting: *Deleted

## 2022-02-05 NOTE — Chronic Care Management (AMB) (Unsigned)
°  Care Management   Note  02/05/2022 Name: [name] MRN: [id number] DOB: Jun 08, 1951  Lance Anderson is a 71 y.o. year old male who is a primary care patient of Christen Butter, NP and is actively engaged with the care management team. I reached out to Ruben Gottron by phone today to assist with re-scheduling a follow up visit with the RN Case Manager  Follow up plan: Unsuccessful telephone outreach attempt made. A HIPAA compliant phone message was left for the patient providing contact information and requesting a return call.   Burman Nieves, CCMA Care Guide, Embedded Care Coordination [facility]   Care Management  Direct Dial: [phone]

## 2022-02-21 DIAGNOSIS — Z96612 Presence of left artificial shoulder joint: Secondary | ICD-10-CM | POA: Diagnosis not present

## 2022-02-26 NOTE — Chronic Care Management (AMB) (Signed)
?  Care Management  ? ?Note ? ?02/26/2022 ?Name: [name] MRN: [id number] DOB: January 25, 1951 ? ?Lance Anderson is a 71 y.o. year old male who is a primary care patient of Christen Butter, NP and is actively engaged with the care management team. I reached out to Ruben Gottron by phone today to assist with re-scheduling a follow up visit with the RN Case Manager ? ?Follow up plan: ?2nd Unsuccessful telephone outreach attempt made. A HIPAA compliant phone message was left for the patient providing contact information and requesting a return call.  ? ?Jyasia Markoff, CCMA ?Care Guide, Embedded Care Coordination ?[place]  Care Management  ?Direct Dial: [phone] ? ? ?

## 2022-03-03 IMAGING — DX DG SHOULDER 1V*L*
2 series · 2 of 2 positions shown · non-contrast
Comparison: Left shoulder radiographs-02/24/2021

CLINICAL DATA: Post left reversed shoulder arthroplasty.

EXAM:
LEFT SHOULDER

[shoulder ap (1 of 2)]
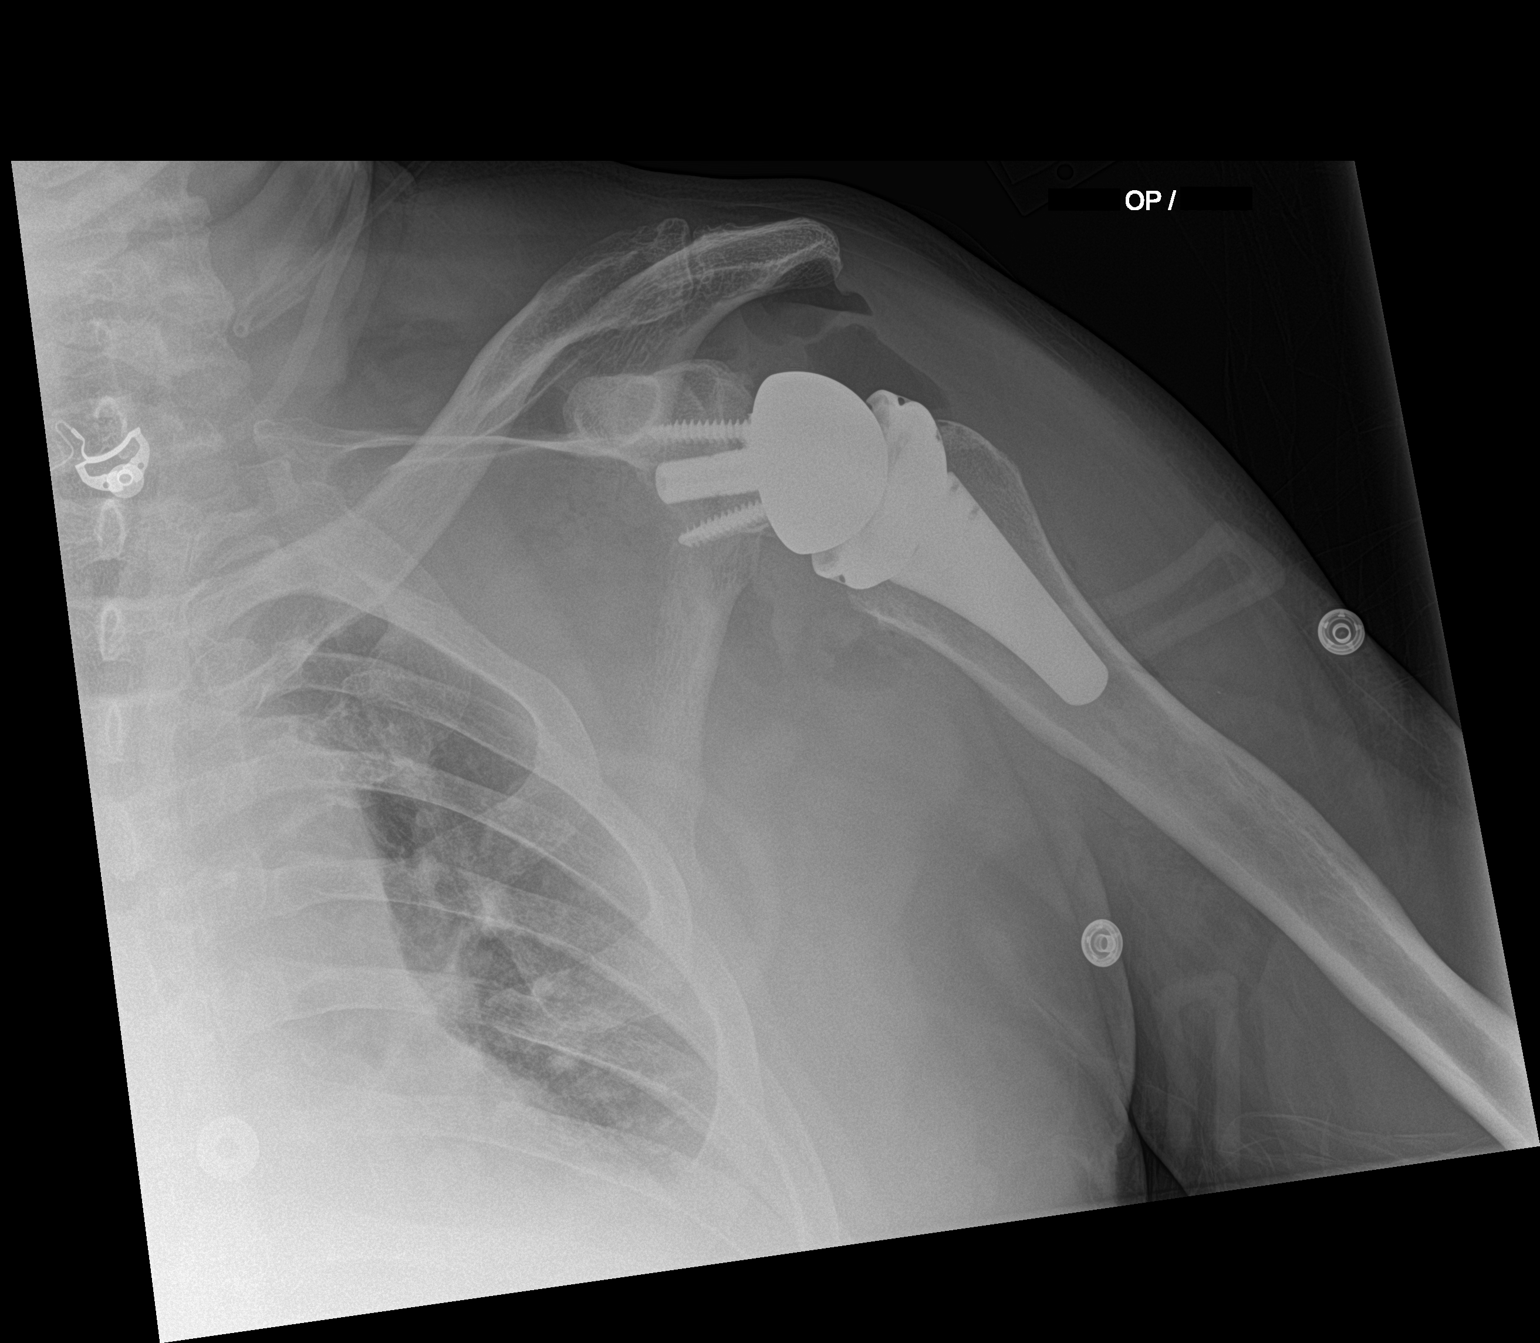

[shoulder ap (2 of 2)]
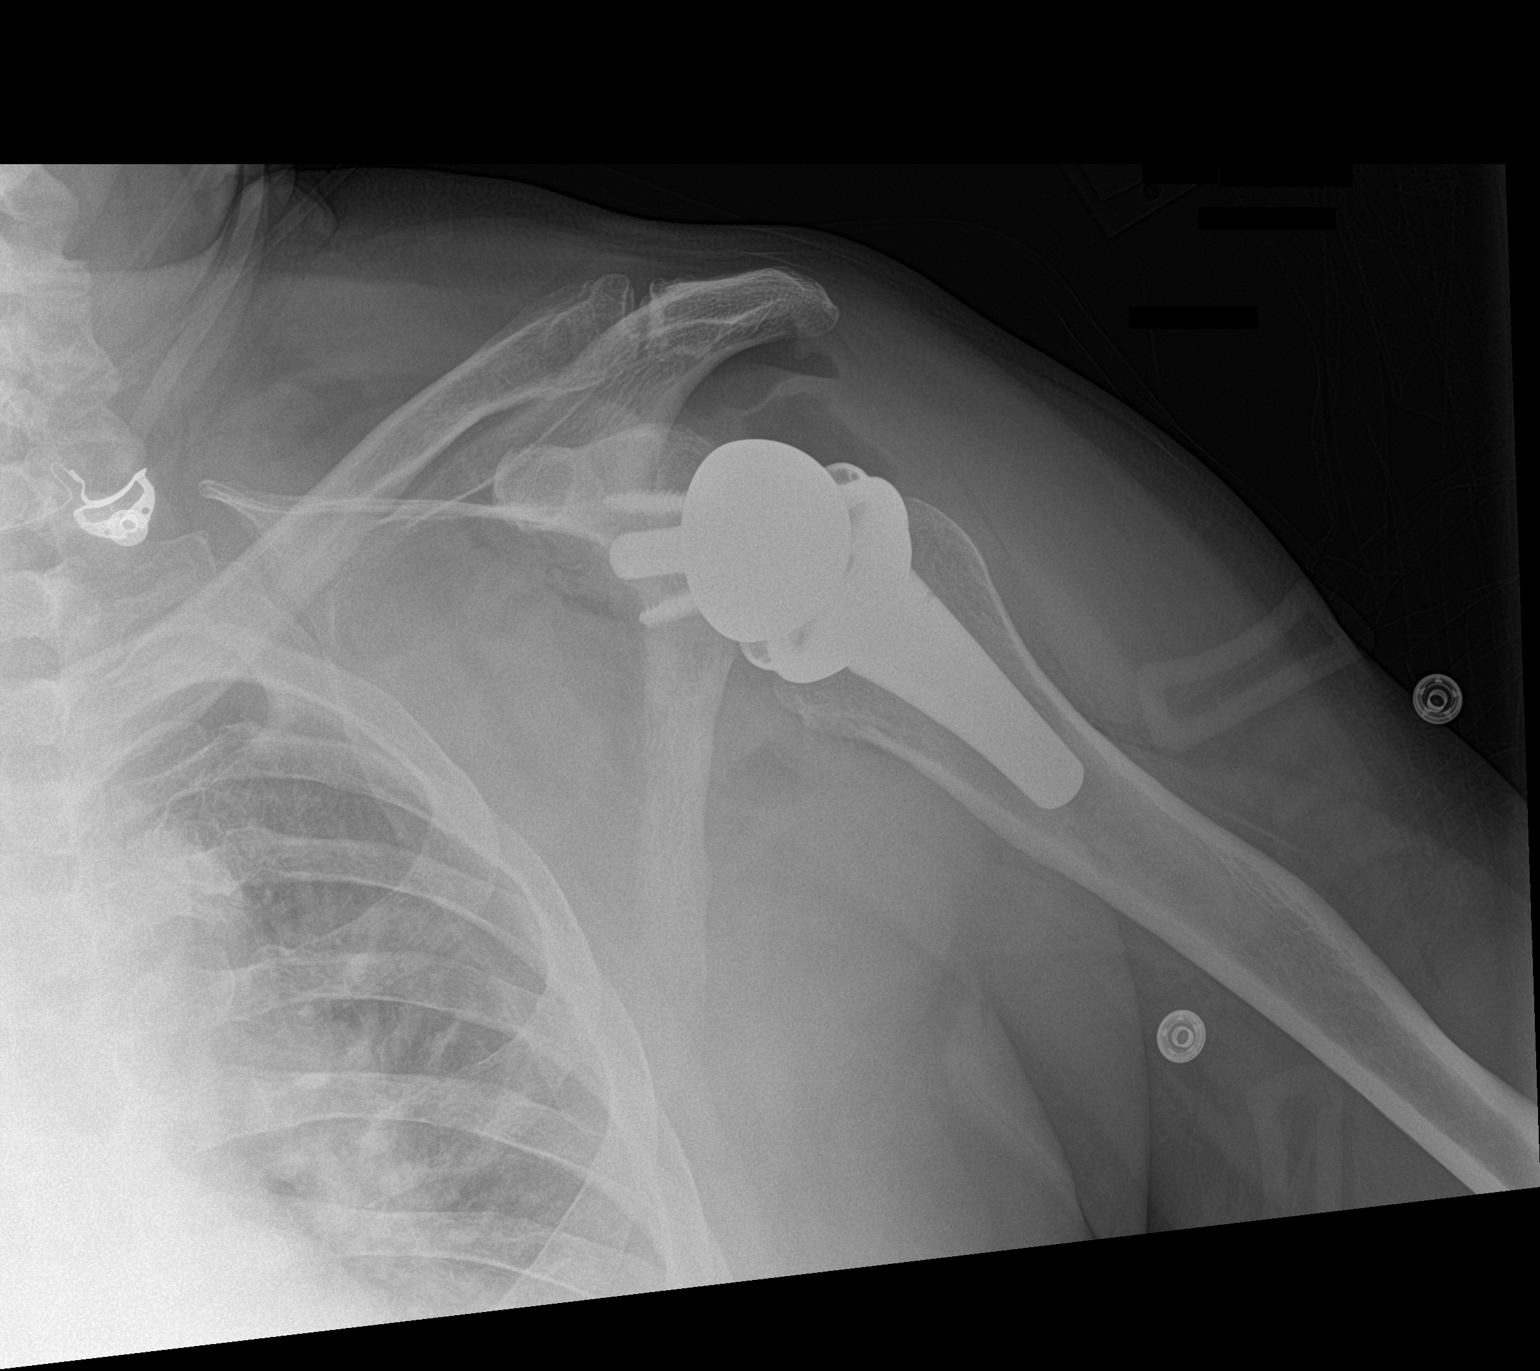

[2 of 2 positions shown; findings below may reference images not displayed]

FINDINGS: Post left total shoulder replacement. Alignment appears anatomic
given projection. No evidence of hardware failure or loosening.
There is a small amount of expected intra-articular air and adjacent
subcutaneous stranding. No radiopaque foreign body. Limited
visualization of the adjacent thorax is normal.
IMPRESSION: Post left total shoulder replacement without evidence of
complication.

## 2022-03-04 ENCOUNTER — Other Ambulatory Visit: Payer: Self-pay | Admitting: Sports Medicine

## 2022-03-04 DIAGNOSIS — M1712 Unilateral primary osteoarthritis, left knee: Secondary | ICD-10-CM

## 2022-03-06 ENCOUNTER — Other Ambulatory Visit: Payer: Self-pay | Admitting: Medical-Surgical

## 2022-03-06 DIAGNOSIS — F3342 Major depressive disorder, recurrent, in full remission: Secondary | ICD-10-CM

## 2022-03-15 NOTE — Chronic Care Management (AMB) (Signed)
?  Care Management  ? ?Note ? ?03/15/2022 ?Name: Lance Anderson MRN: [id number] DOB: 06/17/1951 ? ?[name] is a 71 y.o. year old male who is a primary care patient of Christen Butter, NP and is actively engaged with the care management team. I reached out to Ruben Gottron by phone today to assist with re-scheduling a follow up visit with the RN Case Manager ? ?Follow up plan: ?We have been unable to make contact with the patient for follow up. The care management team is available to follow up with the patient after provider conversation with the patient regarding recommendation for care management engagement and subsequent re-referral to the care management team.  ? ?Solene Hereford, CCMA ?Care Guide, Embedded Care Coordination ?[place]  Care Management  ?Direct Dial: [phone] ? ? ?

## 2022-03-20 ENCOUNTER — Ambulatory Visit: Payer: Self-pay

## 2022-03-20 DIAGNOSIS — E785 Hyperlipidemia, unspecified: Secondary | ICD-10-CM

## 2022-03-20 DIAGNOSIS — I10 Essential (primary) hypertension: Secondary | ICD-10-CM

## 2022-03-20 NOTE — Patient Instructions (Signed)
Visit Information ° °Thank you for allowing me to share the care management and care coordination services that are available to you as part of your health plan and services through your primary care provider and medical home. Please reach out to me at [phone] if the care management/care coordination team may be of assistance to you in the future.  ° °Chimaobi Casebolt, RN, MSN, BSN, CCM °Care Management Coordinator °[facility] °[phone]  °

## 2022-03-20 NOTE — Chronic Care Management (AMB) (Signed)
?  Care Management  ? ?Follow Up Note ? ? ?03/20/2022 ?Name: Lance Anderson MRN: [id number] DOB: Mar 17, 1951 ? ? ?Referred by: Christen Butter, NP ?Reason for referral : No chief complaint on file. ? ? ?RNCM received message from Care Guide of unsuccessful outreach x3 attempts to reschedule patient with no response. RNCM is available to re-engage with the patient after provider conversation with the patient regarding recommendation for care management engagement and subsequent re-referral for RNCM ? ?Follow Up Plan: no further follow up required.  ? ?Kathyrn Sheriff, RN, MSN, BSN, CCM ?Care Management Coordinator ?[facility] ?[phone]  ? ?

## 2022-04-05 ENCOUNTER — Other Ambulatory Visit: Payer: Self-pay | Admitting: Medical-Surgical

## 2022-04-05 DIAGNOSIS — E785 Hyperlipidemia, unspecified: Secondary | ICD-10-CM

## 2022-04-05 DIAGNOSIS — I10 Essential (primary) hypertension: Secondary | ICD-10-CM

## 2022-04-08 ENCOUNTER — Ambulatory Visit (INDEPENDENT_AMBULATORY_CARE_PROVIDER_SITE_OTHER): Payer: Medicare Other | Admitting: Medical-Surgical

## 2022-04-08 DIAGNOSIS — Z0000 Encounter for general adult medical examination without abnormal findings: Secondary | ICD-10-CM

## 2022-04-08 NOTE — Patient Instructions (Addendum)
?MEDICARE ANNUAL WELLNESS VISIT ?Health Maintenance Summary and Written Plan of Care ? ?Mr. Lance Anderson , ? ?Thank you for allowing me to perform your Medicare Annual Wellness Visit and for your ongoing commitment to your health.  ? ?Health Maintenance & Immunization History ?Health Maintenance  ?Topic Date Due  ?? Zoster Vaccines- Shingrix (1 of 2) 07/08/2022 (Originally 01/17/1970)  ?? Pneumonia Vaccine 21+ Years old (2 - PPSV23 if available, else PCV20) 01/07/2023 (Originally 10/20/2021)  ?? INFLUENZA VACCINE  07/23/2022  ?? TETANUS/TDAP  12/30/2022  ?? COLONOSCOPY (Pts 45-51yrs Insurance coverage will need to be confirmed)  06/04/2024  ?? Hepatitis C Screening  Completed  ?? HPV VACCINES  Aged Out  ?? COVID-19 Vaccine  Discontinued  ? ?Immunization History  ?Administered Date(s) Administered  ?? Pneumococcal Conjugate-13 10/20/2020  ?? Td 10/09/2002  ?? Tdap 12/30/2012  ?? Zoster, Live 08/10/2013  ? ? ?These are the patient goals that we discussed: ? Goals Addressed   ?  ?  ?  ?  ?  ? This Visit's Progress  ? ?  Patient Stated (pt-stated)     ?   Patient would like to loose 20 lbs ?  ?  ?  ? ?This is a list of Health Maintenance Items that are overdue or due now: ?Pneumococcal vaccine  ?Shingrix vaccine ? ?Orders/Referrals Placed Today: ?No orders of the defined types were placed in this encounter. ? ?(Contact our referral department at [phone] if you have not spoken with someone about your referral appointment within the next 5 days)  ? ? ?Follow-up Plan ?Follow-up with Samuel Bouche, NP as planned ?Please schedule your pneumonia vaccine and your shingrix vaccine. ?Please schedule your eye exam. ?Medicare wellness visit in one year.  ?Patient will access AVS on my chart. ? ? ? ?  ?Health Maintenance, Male ?Adopting a healthy lifestyle and getting preventive care are important in promoting health and wellness. Ask your health care provider about: ?The right schedule for you to have regular tests and exams. ?Things  you can do on your own to prevent diseases and keep yourself healthy. ?What should I know about diet, weight, and exercise? ?Eat a healthy diet ? ?Eat a diet that includes plenty of vegetables, fruits, low-fat dairy products, and lean protein. ?Do not eat a lot of foods that are high in solid fats, added sugars, or sodium. ?Maintain a healthy weight ?Body mass index (BMI) is a measurement that can be used to identify possible weight problems. It estimates body fat based on height and weight. Your health care provider can help determine your BMI and help you achieve or maintain a healthy weight. ?Get regular exercise ?Get regular exercise. This is one of the most important things you can do for your health. Most adults should: ?Exercise for at least 150 minutes each week. The exercise should increase your heart rate and make you sweat (moderate-intensity exercise). ?Do strengthening exercises at least twice a week. This is in addition to the moderate-intensity exercise. ?Spend less time sitting. Even light physical activity can be beneficial. ?Watch cholesterol and blood lipids ?Have your blood tested for lipids and cholesterol at 69 years of age, then have this test every 5 years. ?You may need to have your cholesterol levels checked more often if: ?Your lipid or cholesterol levels are high. ?You are older than 69 years of age. ?You are at high risk for heart disease. ?What should I know about cancer screening? ?Many types of cancers can be detected early and may  often be prevented. Depending on your health history and family history, you may need to have cancer screening at various ages. This may include screening for: ?Colorectal cancer. ?Prostate cancer. ?Skin cancer. ?Lung cancer. ?What should I know about heart disease, diabetes, and high blood pressure? ?Blood pressure and heart disease ?High blood pressure causes heart disease and increases the risk of stroke. This is more likely to develop in people who have  high blood pressure readings or are overweight. ?Talk with your health care provider about your target blood pressure readings. ?Have your blood pressure checked: ?Every 3-5 years if you are 69-20 years of age. ?Every year if you are 69 years old or older. ?If you are between the ages of 69 and 42 and are a current or former smoker, ask your health care provider if you should have a one-time screening for abdominal aortic aneurysm (AAA). ?Diabetes ?Have regular diabetes screenings. This checks your fasting blood sugar level. Have the screening done: ?Once every three years after age 69 if you are at a normal weight and have a low risk for diabetes. ?More often and at a younger age if you are overweight or have a high risk for diabetes. ?What should I know about preventing infection? ?Hepatitis B ?If you have a higher risk for hepatitis B, you should be screened for this virus. Talk with your health care provider to find out if you are at risk for hepatitis B infection. ?Hepatitis C ?Blood testing is recommended for: ?Everyone born from [birth year] through 1963. ?Anyone with known risk factors for hepatitis C. ?Sexually transmitted infections (STIs) ?You should be screened each year for STIs, including gonorrhea and chlamydia, if: ?You are sexually active and are younger than 69 years of age. ?You are older than 69 years of age and your health care provider tells you that you are at risk for this type of infection. ?Your sexual activity has changed since you were last screened, and you are at increased risk for chlamydia or gonorrhea. Ask your health care provider if you are at risk. ?Ask your health care provider about whether you are at high risk for HIV. Your health care provider may recommend a prescription medicine to help prevent HIV infection. If you choose to take medicine to prevent HIV, you should first get tested for HIV. You should then be tested every 3 months for as long as you are taking the medicine. ?Follow  these instructions at home: ?Alcohol use ?Do not drink alcohol if your health care provider tells you not to drink. ?If you drink alcohol: ?Limit how much you have to 0-2 drinks a day. ?Know how much alcohol is in your drink. In the [place] one drink equals one 12 oz bottle of beer (355 mL), one 5 oz glass of wine (148 mL), or one 1? oz glass of hard liquor (44 mL). ?Lifestyle ?Do not use any products that contain nicotine or tobacco. These products include cigarettes, chewing tobacco, and vaping devices, such as e-cigarettes. If you need help quitting, ask your health care provider. ?Do not use street drugs. ?Do not share needles. ?Ask your health care provider for help if you need support or information about quitting drugs. ?General instructions ?Schedule regular health, dental, and eye exams. ?Stay current with your vaccines. ?Tell your health care provider if: ?You often feel depressed. ?You have ever been abused or do not feel safe at home. ?Summary ?Adopting a healthy lifestyle and getting preventive care are important in promoting health  and wellness. ?Follow your health care provider's instructions about healthy diet, exercising, and getting tested or screened for diseases. ?Follow your health care provider's instructions on monitoring your cholesterol and blood pressure. ?This information is not intended to replace advice given to you by your health care provider. Make sure you discuss any questions you have with your health care provider. ?Document Revised: 04/30/2021 Document Reviewed: 04/30/2021 ?Elsevier Patient Education ? [place]. ? ?

## 2022-04-08 NOTE — Progress Notes (Signed)
? ? ?MEDICARE ANNUAL WELLNESS VISIT ? ?04/08/2022 ? ?Telephone Visit Disclaimer ?This Medicare AWV was conducted by telephone due to national recommendations for restrictions regarding the COVID-19 Pandemic (e.g. social distancing).  I verified, using two identifiers, that I am speaking with Lance Anderson or their authorized healthcare agent. I discussed the limitations, risks, security, and privacy concerns of performing an evaluation and management service by telephone and the potential availability of an in-person appointment in the future. The patient expressed understanding and agreed to proceed.  ?Location of Patient: In his car, parked in the parking lot ?Location of Provider (nurse):  In office. ? ?Subjective:  ? ? ?Lance Anderson is a 71 y.o. male patient of Samuel Bouche, NP who had a Medicare Annual Wellness Visit today via telephone. Mat is Working part time and lives with their spouse. he has 1 child. he reports that he is socially active and does interact with friends/family regularly. he is moderately physically active and enjoys playing with grandkids in the yard.. ? ?Patient Care Team: ?Samuel Bouche, NP as PCP - General (Nurse Practitioner) ? ? ?  04/08/2022  ?  8:08 AM 05/23/2021  ?  9:17 AM 04/17/2021  ?  9:33 AM 02/07/2021  ?  9:11 AM 09/20/2019  ? 10:06 AM 11/04/2018  ?  8:02 AM 07/29/2018  ?  9:04 AM  ?Advanced Directives  ?Does Patient Have a Medical Advance Directive? Yes Yes Yes No No No No  ?Type of Advance Directive Living will Living will;Healthcare Power of [place];Living will      ?Does patient want to make changes to medical advance directive? No - Patient declined        ?Copy of [place] in Chart?   No - copy requested      ?Would patient like information on creating a medical advance directive?    No - Patient declined No - Patient declined No - Patient declined No - Patient declined  ? ? ?Hospital Utilization Over the Past 12 Months: ?# of  hospitalizations or ER visits: 1 ?# of surgeries: 1 ? ?Review of Systems    ?Patient reports that his overall health is unchanged compared to last year. ? ?History obtained from chart review and the patient ? ?Patient Reported Readings (BP, Pulse, CBG, Weight, etc) ?none ? ?Pain Assessment ?Pain : No/denies pain ? ?  ? ?Current Medications & Allergies (verified) ?Allergies as of 04/08/2022   ? ?   Reactions  ? Codeine Other (See Comments)  ? Chest pain  ? Penicillins Diarrhea  ? Reaction: 30 years  ? ?  ? ?  ?Medication List  ?  ? ?  ? Accurate as of April 08, 2022  8:22 AM. If you have any questions, ask your nurse or doctor.  ?  ?  ? ?  ? ?amLODipine 10 MG tablet ?Commonly known as: NORVASC ?TAKE 1 TABLET EVERY DAY ?  ?aspirin 81 MG EC tablet ?Take 1 tablet (81 mg total) by mouth daily. ?  ?atorvastatin 40 MG tablet ?Commonly known as: LIPITOR ?TAKE 1 TABLET EVERY DAY ?  ?FLUoxetine 20 MG capsule ?Commonly known as: PROZAC ?TAKE 1 CAPSULE EVERY DAY ?  ?fluticasone 50 MCG/ACT nasal spray ?Commonly known as: FLONASE ?USE 2 SPRAYS IN EACH NOSTRIL EVERY DAY (NEED MD APPOINTMENT FOR REFILLS) ?  ?meloxicam 15 MG tablet ?Commonly known as: MOBIC ?TAKE 1 TABLET BY MOUTH IN THE MORNING WITH A MEAL FOR 2 WEEKS, THEN DAILY AS  NEEDED FOR PAIN ?  ?Misc. Devices Misc ?Start CPAP at 13 cm. water pressure with EPR 1.  CPAP machine with a mask and supplies for OSA with AHI 10.  AirFit P10(S) nasal pillow mask or patient preference.  Send to a local DME.  [facility] or first available ?  ? ?  ? ? ?History (reviewed): ?Past Medical History:  ?Diagnosis Date  ? Abdominal aortic ectasia ([place]) 03/12/2018  ? Allergy   ? Cataract   ? Complication of anesthesia   ? low BP after anesthesia,syncope episode.  ? DDD (degenerative disc disease), lumbar   ? Depression   ? Hyperlipidemia   ? Hypertension   ? RBBB   ? Renal insufficiency 02/25/2019  ? Sleep apnea   ? Vasovagal syncope   ? ?Past Surgical History:  ?Procedure Laterality Date  ?  CATARACT EXTRACTION, BILATERAL    ? EYE SURGERY    ? JOINT REPLACEMENT    ? KNEE ARTHROSCOPY W/ MENISCAL REPAIR Bilateral   ? REVERSE SHOULDER ARTHROPLASTY Left 06/01/2021  ? Procedure: REVERSE SHOULDER ARTHROPLASTY;  Surgeon: Nicholes Stairs, MD;  Location: [facility];  Service: Orthopedics;  Laterality: Left;  ? ROTATOR CUFF REPAIR Right   ? WISDOM TOOTH EXTRACTION    ? ?Family History  ?Problem Relation Age of Onset  ? Heart disease Father   ? Lung cancer Father   ? Heart attack Brother   ? Heart attack Paternal Grandfather   ? Miscarriages / [place] Daughter   ? ?Social History  ? ?Socioeconomic History  ? Marital status: Married  ?  Spouse name: Zigmund Daniel  ? Number of children: 1  ? Years of education: 58  ? Highest education level: 12th grade  ?Occupational History  ? Occupation: Dealer  ?  Comment: retired  ?Tobacco Use  ? Smoking status: Never  ? Smokeless tobacco: Never  ?Vaping Use  ? Vaping Use: Never used  ?Substance and Sexual Activity  ? Alcohol use: Yes  ?  Alcohol/week: 3.0 standard drinks  ?  Types: 3 Cans of beer per week  ?  Comment: Occasionally, average 10 drinks/month, usually beer  ? Drug use: Never  ? Sexual activity: Yes  ?Other Topics Concern  ? Not on file  ?Social History Narrative  ? Works part time driving a truck. Plays with grandkids in the yard.  ? ?Social Determinants of Health  ? ?Financial Resource Strain: Not on file  ?Food Insecurity: No Food Insecurity  ? Worried About Charity fundraiser in the Last Year: Never true  ? Ran Out of Food in the Last Year: Never true  ?Transportation Needs: No Transportation Needs  ? Lack of Transportation (Medical): No  ? Lack of Transportation (Non-Medical): No  ?Physical Activity: Sufficiently Active  ? Days of Exercise per Week: 5 days  ? Minutes of Exercise per Session: 40 min  ?Stress: No Stress Concern Present  ? Feeling of Stress : Not at all  ?Social Connections: Unknown  ? Frequency of Communication with Friends and Family: Not on file   ? Frequency of Social Gatherings with Friends and Family: Not on file  ? Attends Religious Services: More than 4 times per year  ? Active Member of Clubs or Organizations: Not on file  ? Attends Archivist Meetings: Never  ? Marital Status: Not on file  ? ? ?Activities of Daily Living ? ?  04/05/2022  ?  9:44 AM 05/23/2021  ?  9:20 AM  ?In your present  state of health, do you have any difficulty performing the following activities:  ?Hearing? 0   ?Vision? 0   ?Difficulty concentrating or making decisions? 0   ?Walking or climbing stairs? 0   ?Dressing or bathing? 0   ?Doing errands, shopping? 0 0  ?Preparing Food and eating ? N   ?Using the Toilet? N   ?In the past six months, have you accidently leaked urine? N   ?Do you have problems with loss of bowel control? N   ?Managing your Medications? N   ?Managing your Finances? N   ?Housekeeping or managing your Housekeeping? N   ? ? ?Patient Education/ Literacy ?How often do you need to have someone help you when you read instructions, pamphlets, or other written materials from your doctor or pharmacy?: 1 - Never ?What is the last grade level you completed in school?: 12th grade ? ?Exercise ?Current Exercise Habits: Home exercise routine, Type of exercise: walking, Time (Minutes): 40, Frequency (Times/Week): 5, Weekly Exercise (Minutes/Week): 200, Intensity: Moderate, Exercise limited by: None identified ? ?Diet ?Patient reports consuming 3 meals a day and 0 snack(s) a day ?Patient reports that his primary diet is: Regular ?Patient reports that she does have regular access to food.  ? ?Depression Screen ? ?  04/08/2022  ?  8:09 AM 01/07/2022  ?  9:13 AM 10/10/2021  ?  9:16 AM 07/05/2021  ? 10:10 AM 05/11/2021  ?  1:29 PM 02/07/2021  ?  9:12 AM 04/17/2020  ?  8:40 AM  ?PHQ 2/9 Scores  ?PHQ - 2 Score 0 0 1 1 0 0 0  ?PHQ- 9 Score  0  3 0  2  ?  ? ?Fall Risk ? ?  04/05/2022  ?  9:44 AM 01/07/2022  ?  9:12 AM 10/10/2021  ?  9:13 AM 07/05/2021  ? 10:09 AM 02/07/2021  ?  9:12 AM   ?Fall Risk   ?Falls in the past year? 0 1 1 1  0  ?Number falls in past yr: 0 1 1 0 0  ?Comment   reports playing around with grandchildren/tripping. denies any problems with balance    ?Injury with Fall? 0

## 2022-04-17 ENCOUNTER — Other Ambulatory Visit: Payer: Self-pay | Admitting: Sports Medicine

## 2022-04-17 DIAGNOSIS — M1712 Unilateral primary osteoarthritis, left knee: Secondary | ICD-10-CM

## 2022-04-25 DIAGNOSIS — Z20822 Contact with and (suspected) exposure to covid-19: Secondary | ICD-10-CM | POA: Diagnosis not present

## 2022-04-26 ENCOUNTER — Ambulatory Visit (INDEPENDENT_AMBULATORY_CARE_PROVIDER_SITE_OTHER): Payer: Medicare Other

## 2022-04-26 ENCOUNTER — Ambulatory Visit (INDEPENDENT_AMBULATORY_CARE_PROVIDER_SITE_OTHER): Payer: Medicare Other | Admitting: Sports Medicine

## 2022-04-26 DIAGNOSIS — M1712 Unilateral primary osteoarthritis, left knee: Secondary | ICD-10-CM

## 2022-04-26 MED ORDER — MELOXICAM 15 MG PO TABS: ORAL_TABLET | ORAL | 3 refills | Status: DC

## 2022-04-26 NOTE — Progress Notes (Signed)
? ? ?  Procedures performed today:   ? ?Procedure: Real-time Ultrasound Guided injection of the left knee ?Device: Samsung HS60  ?Verbal informed consent obtained.  ?Time-out conducted.  ?Noted no overlying erythema, induration, or other signs of local infection.  ?Skin prepped in a sterile fashion.  ?Local anesthesia: Topical Ethyl chloride.  ?With sterile technique and under real time ultrasound guidance: Mild effusion noted, 1 cc Kenalog 40, 2 cc lidocaine, 2 cc bupivacaine injected easily ?Completed without difficulty  ?Advised to call if fevers/chills, erythema, induration, drainage, or persistent bleeding.  ?Images permanently stored and available for review in PACS.  ?Impression: Technically successful ultrasound guided injection. ? ?Independent interpretation of notes and tests performed by another provider:  ? ?None. ? ?Brief History, Exam, Impression, and Recommendations:   ? ?Primary osteoarthritis of left knee ?Pleasant 71 year old male, left knee osteoarthritis, he is post osteochondral defect with microfracture. ?Last injection was in April 2022, meloxicam has done well until recently, refilling meloxicam, left knee injection today, return to see me as needed. ? ? ? ?___________________________________________ ?Gwen Her. Dianah Field, M.D., ABFM., CAQSM. ?Primary Care and Sports Medicine ?[place] ? ?Adjunct Instructor of Family Medicine  ?[employer] ?

## 2022-04-26 NOTE — Assessment & Plan Note (Signed)
Pleasant 72 year old male, left knee osteoarthritis, he is post osteochondral defect with microfracture. ?Last injection was in April 2022, meloxicam has done well until recently, refilling meloxicam, left knee injection today, return to see me as needed. ?

## 2022-05-26 ENCOUNTER — Emergency Department (INDEPENDENT_AMBULATORY_CARE_PROVIDER_SITE_OTHER): Payer: Medicare Other

## 2022-05-26 ENCOUNTER — Emergency Department (INDEPENDENT_AMBULATORY_CARE_PROVIDER_SITE_OTHER)
Admission: RE | Admit: 2022-05-26 | Discharge: 2022-05-26 | Disposition: A | Discharge status: Home / Self Care | Payer: Medicare Other | Source: Ambulatory Visit

## 2022-05-26 VITALS — BP 159/87 | HR 54 | Temp 98.0°F | Resp 18 | Ht 71.0 in | Wt 240.0 lb

## 2022-05-26 DIAGNOSIS — M25511 Pain in right shoulder: Secondary | ICD-10-CM

## 2022-05-26 DIAGNOSIS — M19019 Primary osteoarthritis, unspecified shoulder: Secondary | ICD-10-CM

## 2022-05-26 DIAGNOSIS — G8929 Other chronic pain: Secondary | ICD-10-CM | POA: Diagnosis not present

## 2022-05-26 DIAGNOSIS — I10 Essential (primary) hypertension: Secondary | ICD-10-CM

## 2022-05-26 NOTE — Discharge Instructions (Signed)
Your x-ray showed some arthritis.  I recommend you follow-up with orthopedic.  Please call to schedule an appointment.  Continue your meloxicam as previously prescribed.  Use Tylenol for breakthrough pain.  Use heat and stretch for symptom relief.  If anything worsens please return for reevaluation.  Your blood pressure is elevated.  Please monitor this at home and follow-up with your PCP.  If you develop any chest pain, shortness of breath, headache, vision change, dizziness in setting of high blood pressure you need to go to the emergency room.  Avoid sodium, caffeine, decongestants.

## 2022-05-26 NOTE — ED Provider Notes (Signed)
[facility]    CSN: [id number] Arrival date & time: 05/26/22  0854      History   Chief Complaint Chief Complaint  Patient presents with   Shoulder Pain   0900 appt    HPI Lance Anderson is a 71 y.o. male.   Patient presents today with a several month history of worsening right shoulder pain.  Reports pain is localized to anterior right shoulder with radiation into deep joint, described as aching and periodic shooting pains, no aggravating relieving factors identified.  He has been taking Mobic without improvement of symptoms.  He denies any known injury or change in activity prior to symptom onset.  He does have a history of osteoarthritis and had left shoulder replacement several years ago.  He had rotator cuff tear on the right that was repaired 20+ years ago without ongoing pain.  He reports pain is rated 5 on a 0-10 pain scale and is worse first wakes up in the morning.  He is able to perform daily activities without restriction.  Denies any decreased range of motion, numbness, tingling, paresthesias.  He is right-handed.  Blood pressure is elevated today.  Patient reports this is common when he is in a doctor's office.  He denies any chest pain, shortness of breath, headache, vision change, dizziness.  He did take antihypertensive medication this morning.  Denies any increased NSAID use, caffeine, decongestants.   Past Medical History:  Diagnosis Date   Abdominal aortic ectasia (HCC) 03/12/2018   Allergy    Cataract    Complication of anesthesia    low BP after anesthesia,syncope episode.   DDD (degenerative disc disease), lumbar    Depression    Hyperlipidemia    Hypertension    RBBB    Renal insufficiency 02/25/2019   Sleep apnea    Vasovagal syncope     Patient Active Problem List   Diagnosis Date Noted   Tear of meniscus of knee 05/09/2021   COVID-19 virus infection 12/20/2019   Renal insufficiency 02/25/2019   Primary osteoarthritis involving  multiple joints 02/24/2019   Cervical radiculopathy 10/30/2018   Right bundle branch block (RBBB) 05/29/2018   Sinus bradycardia 05/29/2018   Peripheral edema 04/27/2018   Venous stasis dermatitis of right lower extremity 04/27/2018   Strain of calf muscle 04/27/2018   Abdominal aortic ectasia (HCC) 03/12/2018   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   Recurrent major depressive disorder, in full remission (HCC) 02/25/2018   Encounter for long-term (current) use of medications 02/25/2018   Encounter for abdominal aortic aneurysm (AAA) screening 02/25/2018   Dyslipidemia, goal LDL below 100 02/25/2018   Refused influenza vaccine 02/25/2018   Pruritus 08/04/2017   Encounter for monitoring statin therapy 08/04/2017   Depression 05/21/2017   Family history of heart disease in male family member before age 73 05/21/2017   Primary osteoarthritis of left knee 05/21/2017   Class 1 obesity due to excess calories with serious comorbidity in adult 05/21/2017   Allergic rhinitis 05/21/2017   DDD (degenerative disc disease), lumbosacral 05/21/2017   Vasovagal episode 05/21/2017   Essential hypertension with goal blood pressure less than 140/90 08/17/2015   Pseudophakia of both eyes 10/20/2013   Dyslipidemia 08/10/2013    Past Surgical History:  Procedure Laterality Date   CATARACT EXTRACTION, BILATERAL     EYE SURGERY     JOINT REPLACEMENT     KNEE ARTHROSCOPY W/ MENISCAL REPAIR Bilateral    REVERSE SHOULDER ARTHROPLASTY Left 06/01/2021   Procedure: REVERSE SHOULDER  ARTHROPLASTY;  Surgeon: Yolonda Kidaogers, Jason Patrick, MD;  Location: [facility];  Service: Orthopedics;  Laterality: Left;   ROTATOR CUFF REPAIR Right    WISDOM TOOTH EXTRACTION         Home Medications    Prior to Admission medications   Medication Sig Start Date End Date Taking? Authorizing Provider  amLODipine (NORVASC) 10 MG tablet TAKE 1 TABLET EVERY DAY 04/05/22   Christen ButterJessup, Joy, NP  aspirin 81 MG EC tablet Take 1 tablet  (81 mg total) by mouth daily. 02/25/18   Carlis Stableummings, Charley Elizabeth, PA-C  atorvastatin (LIPITOR) 40 MG tablet TAKE 1 TABLET EVERY DAY 04/05/22   Christen ButterJessup, Joy, NP  FLUoxetine (PROZAC) 20 MG capsule TAKE 1 CAPSULE EVERY DAY 03/06/22   Christen ButterJessup, Joy, NP  fluticasone (FLONASE) 50 MCG/ACT nasal spray USE 2 SPRAYS IN EACH NOSTRIL EVERY DAY (NEED MD APPOINTMENT FOR REFILLS) 06/06/21   Christen ButterJessup, Joy, NP  meloxicam (MOBIC) 15 MG tablet TAKE 1 TABLET BY MOUTH ONCE DAILY IN THE MORNING WITH MEALS FOR 14 DAYS THEN AS NEEDED FOR PAIN 04/26/22   Monica Bectonhekkekandam, Thomas J, MD  Misc. Devices MISC Start CPAP at 13 cm. water pressure with EPR 1.  CPAP machine with a mask and supplies for OSA with AHI 10.  AirFit P10(S) nasal pillow mask or patient preference.  Send to a local DME.  [facility] or first available 01/02/21   [provider]    Family History Family History  Problem Relation Age of Onset   Dementia Mother    Heart disease Father    Lung cancer Father    Heart attack Brother    Heart attack Paternal Grandfather    Miscarriages / [place]     Social History Social History   Tobacco Use   Smoking status: Never   Smokeless tobacco: Never  Vaping Use   Vaping Use: Never used  Substance Use Topics   Alcohol use: Yes    Alcohol/week: 3.0 standard drinks    Types: 3 Cans of beer per week    Comment: weekly   Drug use: Never     Allergies   Codeine and Penicillins   Review of Systems Review of Systems  Constitutional:  Positive for activity change. Negative for appetite change, fatigue and fever.  Eyes:  Negative for visual disturbance.  Respiratory:  Negative for cough and shortness of breath.   Cardiovascular:  Negative for chest pain.  Musculoskeletal:  Positive for arthralgias. Negative for myalgias.  Neurological:  Negative for dizziness, light-headedness and headaches.    Physical Exam Triage Vital Signs ED Triage Vitals  Enc Vitals Group     BP 05/26/22 0909 (!)  165/80     Pulse Rate 05/26/22 0909 (!) 55     Resp 05/26/22 0909 20     Temp 05/26/22 0909 98 F (36.7 C)     Temp Source 05/26/22 0909 Oral     SpO2 05/26/22 0909 97 %     Weight 05/26/22 0904 240 lb (108.9 kg)     Height 05/26/22 0904 5\' 11"  (1.803 m)     Head Circumference --      Peak Flow --      Pain Score 05/26/22 0904 5     Pain Loc --      Pain Edu? --      Excl. in GC? --    No data found.  Updated Vital Signs BP (!) 159/87 (BP Location: Left Arm)   Pulse (!) 54  Temp 98 F (36.7 C) (Oral)   Resp 18   Ht 5\' 11"  (1.803 m)   Wt 240 lb (108.9 kg)   SpO2 98%   BMI 33.47 kg/m   Visual Acuity Right Eye Distance:   Left Eye Distance:   Bilateral Distance:    Right Eye Near:   Left Eye Near:    Bilateral Near:     Physical Exam Vitals reviewed.  Constitutional:      General: He is awake.     Appearance: Normal appearance. He is well-developed. He is not ill-appearing.     Comments: Very pleasant male appears stated age no acute distress sitting comfortably in exam room  HENT:     Head: Normocephalic and atraumatic.     Mouth/Throat:     Pharynx: No oropharyngeal exudate, posterior oropharyngeal erythema or uvula swelling.  Cardiovascular:     Rate and Rhythm: Normal rate and regular rhythm.     Heart sounds: Normal heart sounds, S1 normal and S2 normal. No murmur heard. Pulmonary:     Effort: Pulmonary effort is normal.     Breath sounds: Normal breath sounds. No stridor. No wheezing, rhonchi or rales.     Comments: Clear to auscultation bilaterally Musculoskeletal:     Right shoulder: Tenderness and bony tenderness present. No swelling. Normal range of motion. Normal strength.     Comments: Right shoulder: Tender to palpation over AC joint.  Normal active range of motion.  Strength 5/5 bilateral upper extremities.  Hands neurovascularly intact.  Normal drop arm and empty can.  Normal Apley scratch.  Neurological:     Mental Status: He is alert.   Psychiatric:        Behavior: Behavior is cooperative.     UC Treatments / Results  Labs (all labs ordered are listed, but only abnormal results are displayed) Labs Reviewed - No data to display  EKG   Radiology DG Shoulder Right  Result Date: 05/26/2022 CLINICAL DATA:  71 year old male with intermittent right shoulder pain, increasing over the past month. No known injury. EXAM: RIGHT SHOULDER - 2+ VIEW COMPARISON:  Chest CT 05/14/2021. FINDINGS: Bone mineralization is within normal limits for age. There is no evidence of fracture or dislocation. Right AC joint space loss and degenerative spurring is mild to moderate for age. There are multiple chronic right rib fractures. No acute osseous abnormality identified. Negative visible right lung. IMPRESSION: AC joint degeneration and chronic right rib fractures. No acute osseous abnormality identified. Electronically Signed   By: 05/16/2021 M.D.   On: 05/26/2022 09:33    Procedures Procedures (including critical care time)  Medications Ordered in UC Medications - No data to display  Initial Impression / Assessment and Plan / UC Course  I have reviewed the triage vital signs and the nursing notes.  Pertinent labs & imaging results that were available during my care of the patient were reviewed by me and considered in my medical decision making (see chart for details).     X-ray obtained showed arthritis at [facility] joint likely cause of patient's symptoms.  Discussed x-ray findings with patient and encouraged him to follow-up with PCP.  He does have a prescription for Mobic and was encouraged to use this as needed for pain relief.  Recommended conservative treatment measures including heat and stretch for symptom relief.  He can use Tylenol for breakthrough pain.  Discussed alarm symptoms that warrant emergent evaluation.  Blood pressure is elevated today.  Patient denies any signs/symptoms  of endorgan damage.  Recommended that he avoid  decongestants, caffeine, sodium.  He is to follow-up with his PCP.  If he develops any chest pain, shortness of breath, headache, vision change, dizziness in setting of high blood pressure he is to go to the emergency room to which he expressed understanding.  Final Clinical Impressions(s) / UC Diagnoses   Final diagnoses:  Chronic right shoulder pain  Shoulder arthritis  Elevated blood pressure reading with diagnosis of hypertension     Discharge Instructions      Your x-ray showed some arthritis.  I recommend you follow-up with orthopedic.  Please call to schedule an appointment.  Continue your meloxicam as previously prescribed.  Use Tylenol for breakthrough pain.  Use heat and stretch for symptom relief.  If anything worsens please return for reevaluation.  Your blood pressure is elevated.  Please monitor this at home and follow-up with your PCP.  If you develop any chest pain, shortness of breath, headache, vision change, dizziness in setting of high blood pressure you need to go to the emergency room.  Avoid sodium, caffeine, decongestants.     ED Prescriptions   None    PDMP not reviewed this encounter.   Jeani Hawking, PA-C 05/26/22 1003

## 2022-05-26 NOTE — ED Triage Notes (Addendum)
Pt presents to Urgent Care with c/o intermittent R shoulder pain x approx one year w/ worsening pain over the past month. No known injury. Hx of L shoulder replacement approx one year ago and R rotator cuff repair approx 25 years ago.

## 2022-06-13 ENCOUNTER — Ambulatory Visit: Payer: Medicare Other | Admitting: Medical-Surgical

## 2022-06-18 ENCOUNTER — Encounter: Payer: Self-pay | Admitting: Medical-Surgical

## 2022-06-18 ENCOUNTER — Ambulatory Visit (INDEPENDENT_AMBULATORY_CARE_PROVIDER_SITE_OTHER): Payer: Medicare Other | Admitting: Medical-Surgical

## 2022-06-18 VITALS — BP 127/72 | HR 65 | Resp 20 | Ht 71.0 in | Wt 241.1 lb

## 2022-06-18 DIAGNOSIS — I10 Essential (primary) hypertension: Secondary | ICD-10-CM

## 2022-06-18 DIAGNOSIS — N289 Disorder of kidney and ureter, unspecified: Secondary | ICD-10-CM

## 2022-06-18 DIAGNOSIS — Z79899 Other long term (current) drug therapy: Secondary | ICD-10-CM | POA: Diagnosis not present

## 2022-06-18 DIAGNOSIS — F3342 Major depressive disorder, recurrent, in full remission: Secondary | ICD-10-CM | POA: Diagnosis not present

## 2022-06-18 DIAGNOSIS — E785 Hyperlipidemia, unspecified: Secondary | ICD-10-CM | POA: Diagnosis not present

## 2022-06-18 DIAGNOSIS — J309 Allergic rhinitis, unspecified: Secondary | ICD-10-CM | POA: Diagnosis not present

## 2022-06-18 DIAGNOSIS — Z5181 Encounter for therapeutic drug level monitoring: Secondary | ICD-10-CM

## 2022-06-18 MED ORDER — FLUOXETINE HCL 20 MG PO CAPS: 20.0000 mg | ORAL_CAPSULE | Freq: Every day | ORAL | 0 refills | Status: DC

## 2022-06-18 MED ORDER — AMLODIPINE BESYLATE 10 MG PO TABS: 10.0000 mg | ORAL_TABLET | Freq: Every day | ORAL | 0 refills | Status: DC

## 2022-06-18 MED ORDER — ATORVASTATIN CALCIUM 40 MG PO TABS: 40.0000 mg | ORAL_TABLET | Freq: Every day | ORAL | 1 refills | Status: DC

## 2022-06-19 LAB — CBC WITH DIFFERENTIAL/PLATELET
Absolute Monocytes: 667 cells/uL (ref 200–950)
Basophils Absolute: 71 cells/uL (ref 0–200)
Basophils Relative: 1 %
Eosinophils Absolute: 156 cells/uL (ref 15–500)
Eosinophils Relative: 2.2 %
HCT: 42.3 % (ref 38.5–50.0)
Hemoglobin: 14.6 g/dL (ref 13.2–17.1)
Lymphs Abs: 1590 cells/uL (ref 850–3900)
MCH: 32.3 pg (ref 27.0–33.0)
MCHC: 34.5 g/dL (ref 32.0–36.0)
MCV: 93.6 fL (ref 80.0–100.0)
MPV: 10.8 fL (ref 7.5–12.5)
Monocytes Relative: 9.4 %
Neutro Abs: 4615 cells/uL (ref 1500–7800)
Neutrophils Relative %: 65 %
Platelets: 265 10*3/uL (ref 140–400)
RBC: 4.52 10*6/uL (ref 4.20–5.80)
RDW: 12.5 % (ref 11.0–15.0)
Total Lymphocyte: 22.4 %
WBC: 7.1 10*3/uL (ref 3.8–10.8)

## 2022-06-19 LAB — LIPID PANEL
Cholesterol: 144 mg/dL (ref <200)
HDL: 59 mg/dL (ref >=40)
LDL Cholesterol (Calc): 72 mg/dL (calc)
Non-HDL Cholesterol (Calc): 85 mg/dL (calc) (ref <130)
Total CHOL/HDL Ratio: 2.4 (calc) (ref <5.0)
Triglycerides: 57 mg/dL (ref <150)

## 2022-06-19 LAB — COMPLETE METABOLIC PANEL WITH GFR
AG Ratio: 1.6 (calc) (ref 1.0–2.5)
ALT: 17 U/L (ref 9–46)
AST: 19 U/L (ref 10–35)
Albumin: 4.2 g/dL (ref 3.6–5.1)
Alkaline phosphatase (APISO): 92 U/L (ref 35–144)
BUN: 24 mg/dL (ref 7–25)
CO2: 26 mmol/L (ref 20–32)
Calcium: 9.1 mg/dL (ref 8.6–10.3)
Chloride: 108 mmol/L (ref 98–110)
Creat: 1.13 mg/dL (ref 0.70–1.35)
Globulin: 2.6 g/dL (calc) (ref 1.9–3.7)
Glucose, Bld: 93 mg/dL (ref 65–99)
Potassium: 4.3 mmol/L (ref 3.5–5.3)
Sodium: 142 mmol/L (ref 135–146)
Total Bilirubin: 0.8 mg/dL (ref 0.2–1.2)
Total Protein: 6.8 g/dL (ref 6.1–8.1)
eGFR: 70 mL/min/{1.73_m2} (ref >=60)

## 2022-07-31 ENCOUNTER — Other Ambulatory Visit: Payer: Self-pay | Admitting: Medical-Surgical

## 2022-07-31 DIAGNOSIS — F3342 Major depressive disorder, recurrent, in full remission: Secondary | ICD-10-CM

## 2022-09-03 ENCOUNTER — Encounter: Payer: Self-pay | Admitting: Medical-Surgical

## 2022-09-04 MED ORDER — FAMOTIDINE 20 MG PO TABS: 20.0000 mg | ORAL_TABLET | Freq: Two times a day (BID) | ORAL | 1 refills | Status: DC | PRN

## 2022-09-04 MED ORDER — PANTOPRAZOLE SODIUM 40 MG PO TBEC: 40.0000 mg | DELAYED_RELEASE_TABLET | Freq: Every day | ORAL | 1 refills | Status: DC

## 2022-09-11 ENCOUNTER — Ambulatory Visit (INDEPENDENT_AMBULATORY_CARE_PROVIDER_SITE_OTHER): Payer: Medicare Other | Admitting: Sports Medicine

## 2022-09-11 DIAGNOSIS — M7541 Impingement syndrome of right shoulder: Secondary | ICD-10-CM

## 2022-09-11 NOTE — Assessment & Plan Note (Signed)
Pleasant 71 year old male, chronic right shoulder pain, history of left shoulder reverse arthroplasty. Pain is over the deltoid worse with abduction, on exam he has positive Neer's, Hawkins, empty can signs, positive O'Brien's test. I suspect more of a rotator cuff dysfunction type process. Explained the anatomy and pathophysiology. We will start with home conditioning, he will continue meloxicam, x-rays are already in the chart, return to see me in 6 weeks, subacromial injection if not better.

## 2022-09-11 NOTE — Progress Notes (Signed)
    Procedures performed today:    None.  Independent interpretation of notes and tests performed by another provider:   None.  Brief History, Exam, Impression, and Recommendations:    Impingement syndrome of shoulder, right Pleasant 71 year old male, chronic right shoulder pain, history of left shoulder reverse arthroplasty. Pain is over the deltoid worse with abduction, on exam he has positive Neer's, Hawkins, empty can signs, positive O'Brien's test. I suspect more of a rotator cuff dysfunction type process. Explained the anatomy and pathophysiology. We will start with home conditioning, he will continue meloxicam, x-rays are already in the chart, return to see me in 6 weeks, subacromial injection if not better.    ____________________________________________ Gwen Her. Dianah Field, M.D., ABFM., CAQSM., AME. Primary Care and Sports Medicine [facility]  Adjunct Professor of [employer]  Risk manager

## 2022-10-06 ENCOUNTER — Other Ambulatory Visit: Payer: Self-pay | Admitting: Medical-Surgical

## 2022-10-06 DIAGNOSIS — F3342 Major depressive disorder, recurrent, in full remission: Secondary | ICD-10-CM

## 2022-10-28 ENCOUNTER — Ambulatory Visit: Payer: Medicare Other | Admitting: Sports Medicine

## 2022-11-12 ENCOUNTER — Other Ambulatory Visit: Payer: Self-pay | Admitting: Medical-Surgical

## 2022-11-13 ENCOUNTER — Other Ambulatory Visit: Payer: Self-pay | Admitting: Medical-Surgical

## 2022-11-13 DIAGNOSIS — E785 Hyperlipidemia, unspecified: Secondary | ICD-10-CM

## 2022-11-13 DIAGNOSIS — I10 Essential (primary) hypertension: Secondary | ICD-10-CM

## 2022-11-19 ENCOUNTER — Other Ambulatory Visit: Payer: Self-pay

## 2022-11-19 MED ORDER — FAMOTIDINE 20 MG PO TABS: 20.0000 mg | ORAL_TABLET | Freq: Two times a day (BID) | ORAL | 0 refills | Status: DC | PRN

## 2022-12-12 ENCOUNTER — Ambulatory Visit
Admission: RE | Admit: 2022-12-12 | Discharge: 2022-12-12 | Disposition: A | Discharge status: Home / Self Care | Payer: Medicare Other | Source: Ambulatory Visit | Attending: Family Medicine | Admitting: Family Medicine

## 2022-12-12 VITALS — BP 154/96 | HR 78 | Temp 99.2°F | Resp 17

## 2022-12-12 DIAGNOSIS — K219 Gastro-esophageal reflux disease without esophagitis: Secondary | ICD-10-CM | POA: Diagnosis not present

## 2022-12-12 DIAGNOSIS — J101 Influenza due to other identified influenza virus with other respiratory manifestations: Secondary | ICD-10-CM

## 2022-12-12 LAB — POC INFLUENZA A AND B ANTIGEN (URGENT CARE ONLY)
Influenza A Ag: POSITIVE — AB
Influenza B Ag: NEGATIVE

## 2022-12-12 MED ORDER — OSELTAMIVIR PHOSPHATE 75 MG PO CAPS: 75.0000 mg | ORAL_CAPSULE | Freq: Two times a day (BID) | ORAL | 0 refills | Status: AC

## 2022-12-12 MED ORDER — PANTOPRAZOLE SODIUM 40 MG PO TBEC: 40.0000 mg | DELAYED_RELEASE_TABLET | Freq: Every day | ORAL | 0 refills | Status: DC

## 2022-12-12 NOTE — Discharge Instructions (Signed)
Take plain guaifenesin (1200mg extended release tabs such as Mucinex) twice daily, with plenty of water, for cough and congestion. Get adequate rest.   May take Delsym Cough Suppressant ("12 Hour Cough Relief") at bedtime for nighttime cough.  Try warm salt water gargles for sore throat.  Stop all antihistamines for now, and other non-prescription cough/cold preparations.   If symptoms become significantly worse during the night or over the weekend, proceed to the local emergency room.  

## 2022-12-12 NOTE — ED Triage Notes (Addendum)
Pt c/o fever and bodyaches since Tues am. Tmax fever 101 this am. Tylenol at 10am. COVID at home neg last night. Also c/o heartburn. Was rx'd protonix from PCP but has run out. PCP laso sent in prilosec which isn't helping.

## 2022-12-12 NOTE — ED Provider Notes (Signed)
[name]    CSN: [id number] Arrival date & time: 12/12/22  1300      History   Chief Complaint Chief Complaint  Patient presents with   Heartburn    APPT 115pm   Fever   Generalized Body Aches    HPI Lance Anderson is a 71 y.o. male.   Patient complains of 2 day history flu-like illness including myalgias, headache, fever to 101 this morning, fatigue, and cough.  He denies nasal congestion and sore throat.  Cough is non-productive and somewhat worse at night.  No pleuritic pain or shortness of breath.  He had a negative home COVID test last night. Patient also complains of recurrent "heartburn."  He had taken Protonix in the past that worked well.  His PCP sent in an RX for Pepcid 40mg  which he states is not effective.  He requests a refill of Protonix.  The history is provided by the patient.    Past Medical History:  Diagnosis Date   Abdominal aortic ectasia (HCC) 03/12/2018   Allergy    Cataract    Complication of anesthesia    low BP after anesthesia,syncope episode.   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   Tear of meniscus of knee 05/09/2021   Pain in joint of left shoulder 04/05/2021   COVID-19 virus infection 12/20/2019   Renal insufficiency 02/25/2019   Primary osteoarthritis involving multiple joints 02/24/2019   Cervical radiculopathy 10/30/2018   Right bundle branch block (RBBB) 05/29/2018   Sinus bradycardia 05/29/2018   Peripheral edema 04/27/2018   Venous stasis dermatitis of right lower extremity 04/27/2018   Strain of calf muscle 04/27/2018   Abdominal aortic ectasia (HCC) 03/12/2018   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    Recurrent major depressive disorder, in full remission (HCC) 02/25/2018   Refused influenza vaccine 02/25/2018   Encounter for monitoring statin therapy 08/04/2017   Depression 05/21/2017   Family history of heart disease in male family member before age 16 05/21/2017   Primary osteoarthritis of left knee 05/21/2017   Class 1 obesity due to excess calories with serious comorbidity in adult 05/21/2017   Allergic rhinitis 05/21/2017   DDD (degenerative disc disease), lumbosacral 05/21/2017   Vasovagal episode 05/21/2017   Essential hypertension with goal blood pressure less than 140/90 08/17/2015   Pseudophakia of both eyes 10/20/2013   Dyslipidemia 08/10/2013    Past Surgical History:  Procedure Laterality Date   CATARACT EXTRACTION, BILATERAL     EYE SURGERY     JOINT REPLACEMENT     KNEE ARTHROSCOPY W/ MENISCAL REPAIR Bilateral    REVERSE SHOULDER ARTHROPLASTY Left 06/01/2021   Procedure: REVERSE SHOULDER ARTHROPLASTY;  Surgeon: 08/01/2021, MD;  Location: [facility];  Service: Orthopedics;  Laterality: Left;   ROTATOR CUFF REPAIR Right    WISDOM TOOTH EXTRACTION         Home Medications    Prior to Admission medications   Medication Sig Start Date End Date Taking? Authorizing Provider  oseltamivir (TAMIFLU) 75 MG capsule Take 1 capsule (75 mg total) by mouth 2 (two) times daily for 5 days. 12/12/22 12/17/22 Yes 12/19/22, MD  amLODipine (NORVASC) 10 MG tablet TAKE 1 TABLET EVERY DAY 11/13/22   Christen Butter, NP  aspirin 81 MG EC tablet Take 1 tablet (81 mg total) by mouth daily. 02/25/18   Carlis Stable, PA-C  atorvastatin (LIPITOR) 40 MG tablet TAKE 1 TABLET EVERY DAY 11/13/22   Christen Butter, NP  famotidine (PEPCID) 20 MG tablet Take 1 tablet (20 mg total) by mouth 2 (two) times daily as needed for heartburn or indigestion. 11/19/22   Christen Butter, NP  FLUoxetine (PROZAC) 20 MG capsule TAKE 1 CAPSULE EVERY DAY 10/07/22   Christen Butter, NP  fluticasone  (FLONASE) 50 MCG/ACT nasal spray USE 2 SPRAYS IN EACH NOSTRIL EVERY DAY (NEED MD APPOINTMENT FOR REFILLS) 06/06/21   Christen Butter, NP  meloxicam (MOBIC) 15 MG tablet TAKE 1 TABLET BY MOUTH ONCE DAILY IN THE MORNING WITH MEALS FOR 14 DAYS THEN AS NEEDED FOR PAIN 04/26/22   Monica Becton, MD  Misc. Devices MISC Start CPAP at 13 cm. water pressure with EPR 1.  CPAP machine with a mask and supplies for OSA with AHI 10.  AirFit P10(S) nasal pillow mask or patient preference.  Send to a local DME.  Luna II or first available 01/02/21   [provider]  pantoprazole (PROTONIX) 40 MG tablet Take 1 tablet (40 mg total) by mouth daily. 12/12/22   Lattie Haw, MD    Family History Family History  Problem Relation Age of Onset   Dementia Mother    Heart disease Father    Lung cancer Father    Heart attack Brother    Heart attack Paternal Grandfather    Miscarriages / [place] Daughter     Social History Social History   Tobacco Use   Smoking status: Never   Smokeless tobacco: Never  Vaping Use   Vaping Use: Never used  Substance Use Topics   Alcohol use: Yes    Alcohol/week: 3.0 standard drinks of alcohol    Types: 3 Cans of beer per week    Comment: weekly   Drug use: Never     Allergies   Codeine and Penicillins   Review of Systems Review of Systems No sore throat + cough No pleuritic pain No wheezing No nasal congestion No post-nasal drainage No sinus pain/pressure No itchy/red eyes No earache No hemoptysis No SOB + fever  + nausea No vomiting No abdominal pain, but having "heartburn" No diarrhea No urinary symptoms No skin rash + fatigue + myalgias + headache   Physical Exam Triage Vital Signs ED Triage Vitals  Enc Vitals Group     BP 12/12/22 1324 (!) 154/96     Pulse Rate 12/12/22 1324 78     Resp 12/12/22 1324 17     Temp 12/12/22 1324 99.2 F (37.3 C)     Temp Source 12/12/22 1324 Oral     SpO2 12/12/22 1324 97 %     Weight --       Height --      Head Circumference --      Peak Flow --      Pain Score 12/12/22 1326 0     Pain Loc --      Pain Edu? --      Excl. in GC? --    No data found.  Updated Vital Signs BP (!) 154/96 (BP Location: Left Arm)   Pulse 78   Temp 99.2 F (37.3 C) (Oral)   Resp 17   SpO2 97%  Visual Acuity Right Eye Distance:   Left Eye Distance:   Bilateral Distance:    Right Eye Near:   Left Eye Near:    Bilateral Near:     Physical Exam Nursing notes and Vital Signs reviewed. Appearance:  Patient appears stated age, and in no acute distress Eyes:  Pupils are equal, round, and reactive to light and accomodation.  Extraocular movement is intact.  Conjunctivae are not inflamed  Ears:  Canals normal.  Tympanic membranes normal.  Nose:  Mildly congested turbinates.  No sinus tenderness.  Pharynx:  Normal Neck:  Supple.  No adenopathy  Lungs:  Clear to auscultation.  Breath sounds are equal.  Moving air well. Heart:  Regular rate and rhythm without murmurs, rubs, or gallops.  Abdomen:  Nontender without masses or hepatosplenomegaly.  Bowel sounds are present.  No CVA or flank tenderness.  Extremities:  No edema.  Skin:  No rash present.   UC Treatments / Results  Labs (all labs ordered are listed, but only abnormal results are displayed) Labs Reviewed  POC INFLUENZA A AND B ANTIGEN (URGENT CARE ONLY) - Abnormal; Notable for the following components:      Result Value   Influenza A Ag Positive (*)    All other components within normal limits    EKG   Radiology No results found.  Procedures Procedures (including critical care time)  Medications Ordered in UC Medications - No data to display  Initial Impression / Assessment and Plan / UC Course  I have reviewed the triage vital signs and the nursing notes.  Pertinent labs & imaging results that were available during my care of the patient were reviewed by me and considered in my medical decision making (see  chart for details).    Begin Tamiflu. Refill Protonix. Followup with Family Doctor if not improved in one week.   Final Clinical Impressions(s) / UC Diagnoses   Final diagnoses:  Influenza A  Gastroesophageal reflux disease, unspecified whether esophagitis present     Discharge Instructions      Take plain guaifenesin (1200mg  extended release tabs such as Mucinex) twice daily, with plenty of water, for cough and congestion.  Get adequate rest.   May take Delsym Cough Suppressant ("12 Hour Cough Relief") at bedtime for nighttime cough.  Try warm salt water gargles for sore throat.  Stop all antihistamines for now, and other non-prescription cough/cold preparations. If symptoms become significantly worse during the night or over the weekend, proceed to the local emergency room.     ED Prescriptions     Medication Sig Dispense Auth. Provider   pantoprazole (PROTONIX) 40 MG tablet Take 1 tablet (40 mg total) by mouth daily. 30 tablet , MD   oseltamivir (TAMIFLU) 75 MG capsule Take 1 capsule (75 mg total) by mouth 2 (two) times daily for 5 days. 10 capsule Lattie Haw, MD         Lattie Haw, MD 12/14/22 0900

## 2023-01-01 ENCOUNTER — Ambulatory Visit (INDEPENDENT_AMBULATORY_CARE_PROVIDER_SITE_OTHER): Payer: Medicare Other | Admitting: Medical-Surgical

## 2023-01-01 ENCOUNTER — Encounter: Payer: Self-pay | Admitting: Medical-Surgical

## 2023-01-01 VITALS — BP 147/75 | HR 61 | Resp 20 | Ht 71.0 in | Wt 244.7 lb

## 2023-01-01 DIAGNOSIS — E785 Hyperlipidemia, unspecified: Secondary | ICD-10-CM

## 2023-01-01 DIAGNOSIS — F3342 Major depressive disorder, recurrent, in full remission: Secondary | ICD-10-CM | POA: Diagnosis not present

## 2023-01-01 DIAGNOSIS — J309 Allergic rhinitis, unspecified: Secondary | ICD-10-CM | POA: Diagnosis not present

## 2023-01-01 DIAGNOSIS — N289 Disorder of kidney and ureter, unspecified: Secondary | ICD-10-CM

## 2023-01-01 DIAGNOSIS — I10 Essential (primary) hypertension: Secondary | ICD-10-CM | POA: Diagnosis not present

## 2023-01-01 DIAGNOSIS — M159 Polyosteoarthritis, unspecified: Secondary | ICD-10-CM | POA: Diagnosis not present

## 2023-01-01 DIAGNOSIS — K219 Gastro-esophageal reflux disease without esophagitis: Secondary | ICD-10-CM | POA: Diagnosis not present

## 2023-01-01 MED ORDER — FLUTICASONE PROPIONATE 50 MCG/ACT NA SUSP: NASAL | 3 refills | Status: DC

## 2023-01-01 MED ORDER — ATORVASTATIN CALCIUM 40 MG PO TABS: 40.0000 mg | ORAL_TABLET | Freq: Every day | ORAL | 0 refills | Status: DC

## 2023-01-01 MED ORDER — PANTOPRAZOLE SODIUM 20 MG PO TBEC: 20.0000 mg | DELAYED_RELEASE_TABLET | Freq: Every day | ORAL | 1 refills | Status: DC

## 2023-01-01 MED ORDER — AMLODIPINE BESYLATE 10 MG PO TABS: 10.0000 mg | ORAL_TABLET | Freq: Every day | ORAL | 0 refills | Status: DC

## 2023-01-01 NOTE — Progress Notes (Signed)
Established Patient Office Visit  Subjective   Patient ID: Lance Anderson, male   DOB: Dec 27, 1950 Age: 72 y.o. MRN: [id number]   Chief Complaint  Patient presents with   Follow-up   Hypertension   HPI Pleasant 72 year old male presenting today for the following:  Hypertension: Taking amlodipine 10 mg daily, tolerating well without side effects.  He does have a little lower extremity edema but this has been a chronic issue and is managed with sodium restriction, increased activity, and elevation of the legs and feet.  Follows a low-sodium diet.  Was previously going to the gym but has not been doing this for a while.  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  Admits that he knows he needs to get back in the gym because he can tell a difference physically.  Denies chest pain, shortness of breath, palpitations, headaches, dizziness, and syncopal episodes.  Not regularly checking blood pressures at home although he does have a cuff.  Hyperlipidemia: Taking atorvastatin 40 mg daily, tolerating well without side effects.  Aims for a low-fat heart healthy diet.  Mood: Taking fluoxetine 20 mg daily, tolerating well without side effects.  This has been very helpful for him and keeps his mood stable.  Denies SI/HI.  Joint pain: Taking meloxicam 15 mg daily, tolerating well without side effects.  Originally started it for left shoulder pain but he has had a left shoulder replacement and now suffers from right shoulder pain.  Medication helps and keeps him functional.  Allergies: Using Flonase on an as-needed basis, mostly in the spring around Christmas time.  GERD: Taking Protonix 40 mg daily which she reports helps tremendously.  Tried   Objective:    Vitals:   01/01/23 0817  BP: (!) 154/69  Pulse: (!) 58  Resp: 20  Height: 5\' 11"  (1.803 m)  Weight: 244 lb 11.2 oz (111 kg)  SpO2: 98%  BMI (Calculated): 34.14   Physical  Exam Vitals reviewed.  Constitutional:      General: He is not in acute distress.    Appearance: Normal appearance. He is obese. He is not ill-appearing.  HENT:     Head: Normocephalic.  Cardiovascular:     Rate and Rhythm: Normal rate.     Pulses: Normal pulses.     Heart sounds: Normal heart sounds. No murmur heard.    No friction rub. No gallop.  Pulmonary:     Effort: Pulmonary effort is normal. No respiratory distress.     Breath sounds: Normal breath sounds.  Skin:    General: Skin is warm and dry.  Neurological:     Mental Status: He is alert and oriented to person, place, and time.  Psychiatric:        Mood and Affect: Mood normal.        Behavior: Behavior normal.        Thought Content: Thought content normal.        Judgment: Judgment normal.      No results found for this or any previous visit (from the past 24 hour(s)).     The 10-year ASCVD risk score (Arnett DK, et al., 2019) is: 20.6%   Values used to calculate the score:     Age: 44 years     Sex: Male     Is Non-Hispanic African American: No     Diabetic: No     Tobacco smoker: No  Systolic Blood Pressure: 983 mmHg     Is BP treated: Yes     HDL Cholesterol: 59 mg/dL     Total Cholesterol: 144 mg/dL   Assessment & Plan:   1. Essential hypertension with goal blood pressure less than 140/90 Blood pressure elevated on arrival, recheck 147/75.  Checking labs as below.  Continue amlodipine 10 mg daily.  Recommend monitoring blood pressure several times weekly at home with a goal of 140/90 or less.  Holding off on medication changes today.  Would like him to monitor his blood pressures over the next 2 weeks.  I will send him a message on MyChart to see how his readings are and if necessary we can make some medication adjustments.  In the meantime, resume regular intentional exercise and continue following a low-sodium diet. - CBC with Differential/Platelet - COMPLETE METABOLIC PANEL WITH GFR - Lipid  panel - amLODipine (NORVASC) 10 MG tablet; Take 1 tablet (10 mg total) by mouth daily.  Dispense: 90 tablet; Refill: 0  2. Recurrent major depressive disorder, in full remission ([place]) Stable.  Continue fluoxetine 20 mg daily.  3. Renal insufficiency Checking CMP. - COMPLETE METABOLIC PANEL WITH GFR  4. Dyslipidemia Checking labs as below.  Continue atorvastatin 40 mg daily. - COMPLETE METABOLIC PANEL WITH GFR - Lipid panel - atorvastatin (LIPITOR) 40 MG tablet; Take 1 tablet (40 mg total) by mouth daily.  Dispense: 90 tablet; Refill: 0  5. Allergic rhinitis, unspecified seasonality, unspecified trigger Continue as needed use of Flonase. - fluticasone (FLONASE) 50 MCG/ACT nasal spray; USE 2 SPRAYS IN EACH NOSTRIL EVERY DAY  Dispense: 9.9 mL; Refill: 3  6. Gastroesophageal reflux disease, unspecified whether esophagitis present Okay to finish out current prescription of Protonix 40 mg daily.  After that, reduce to 20 mg daily.  7. Primary osteoarthritis involving multiple joints Okay to continue meloxicam.   Return in about 1 year (around 01/02/2024) for HTN/HLD/mood follow up.  ___________________________________________ Clearnce Sorrel, DNP, APRN, FNP-BC [facility] [place]

## 2023-01-02 LAB — CBC WITH DIFFERENTIAL/PLATELET
Absolute Monocytes: 605 cells/uL (ref 200–950)
Basophils Absolute: 61 cells/uL (ref 0–200)
Basophils Relative: 0.9 %
Eosinophils Absolute: 286 cells/uL (ref 15–500)
Eosinophils Relative: 4.2 %
HCT: 40.4 % (ref 38.5–50.0)
Hemoglobin: 14.1 g/dL (ref 13.2–17.1)
Lymphs Abs: 1822 cells/uL (ref 850–3900)
MCH: 32.6 pg (ref 27.0–33.0)
MCHC: 34.9 g/dL (ref 32.0–36.0)
MCV: 93.3 fL (ref 80.0–100.0)
MPV: 10.9 fL (ref 7.5–12.5)
Monocytes Relative: 8.9 %
Neutro Abs: 4026 cells/uL (ref 1500–7800)
Neutrophils Relative %: 59.2 %
Platelets: 281 10*3/uL (ref 140–400)
RBC: 4.33 10*6/uL (ref 4.20–5.80)
RDW: 12 % (ref 11.0–15.0)
Total Lymphocyte: 26.8 %
WBC: 6.8 10*3/uL (ref 3.8–10.8)

## 2023-01-02 LAB — COMPLETE METABOLIC PANEL WITH GFR
AG Ratio: 1.3 (calc) (ref 1.0–2.5)
ALT: 25 U/L (ref 9–46)
AST: 20 U/L (ref 10–35)
Albumin: 3.9 g/dL (ref 3.6–5.1)
Alkaline phosphatase (APISO): 82 U/L (ref 35–144)
BUN/Creatinine Ratio: 33 (calc) — ABNORMAL HIGH (ref 6–22)
BUN: 36 mg/dL — ABNORMAL HIGH (ref 7–25)
CO2: 27 mmol/L (ref 20–32)
Calcium: 8.9 mg/dL (ref 8.6–10.3)
Chloride: 108 mmol/L (ref 98–110)
Creat: 1.08 mg/dL (ref 0.70–1.35)
Globulin: 2.9 g/dL (calc) (ref 1.9–3.7)
Glucose, Bld: 95 mg/dL (ref 65–99)
Potassium: 4.6 mmol/L (ref 3.5–5.3)
Sodium: 142 mmol/L (ref 135–146)
Total Bilirubin: 0.5 mg/dL (ref 0.2–1.2)
Total Protein: 6.8 g/dL (ref 6.1–8.1)
eGFR: 74 mL/min/{1.73_m2} (ref >=60)

## 2023-01-02 LAB — LIPID PANEL
Cholesterol: 153 mg/dL (ref <200)
HDL: 59 mg/dL (ref >=40)
LDL Cholesterol (Calc): 80 mg/dL (calc)
Non-HDL Cholesterol (Calc): 94 mg/dL (calc) (ref <130)
Total CHOL/HDL Ratio: 2.6 (calc) (ref <5.0)
Triglycerides: 67 mg/dL (ref <150)

## 2023-01-15 ENCOUNTER — Encounter: Payer: Self-pay | Admitting: Medical-Surgical

## 2023-01-25 ENCOUNTER — Other Ambulatory Visit: Payer: Self-pay | Admitting: Medical-Surgical

## 2023-01-25 DIAGNOSIS — E785 Hyperlipidemia, unspecified: Secondary | ICD-10-CM

## 2023-01-25 DIAGNOSIS — I10 Essential (primary) hypertension: Secondary | ICD-10-CM

## 2023-03-02 ENCOUNTER — Other Ambulatory Visit: Payer: Self-pay | Admitting: Medical-Surgical

## 2023-03-02 DIAGNOSIS — F3342 Major depressive disorder, recurrent, in full remission: Secondary | ICD-10-CM

## 2023-03-16 ENCOUNTER — Other Ambulatory Visit: Payer: Self-pay | Admitting: Medical-Surgical

## 2023-03-16 DIAGNOSIS — J309 Allergic rhinitis, unspecified: Secondary | ICD-10-CM

## 2023-04-03 ENCOUNTER — Other Ambulatory Visit: Payer: Self-pay | Admitting: Medical-Surgical

## 2023-04-03 DIAGNOSIS — I10 Essential (primary) hypertension: Secondary | ICD-10-CM

## 2023-04-03 DIAGNOSIS — E785 Hyperlipidemia, unspecified: Secondary | ICD-10-CM

## 2023-04-08 ENCOUNTER — Ambulatory Visit
Admission: RE | Admit: 2023-04-08 | Discharge: 2023-04-08 | Disposition: A | Discharge status: Home / Self Care | Payer: Medicare Other | Source: Ambulatory Visit | Attending: Family Medicine | Admitting: Family Medicine

## 2023-04-08 VITALS — BP 146/84 | HR 68 | Temp 98.2°F | Resp 16 | Ht 71.0 in | Wt 240.0 lb

## 2023-04-08 DIAGNOSIS — J0100 Acute maxillary sinusitis, unspecified: Secondary | ICD-10-CM | POA: Diagnosis not present

## 2023-04-08 MED ORDER — AMOXICILLIN-POT CLAVULANATE 875-125 MG PO TABS: 1.0000 | ORAL_TABLET | Freq: Two times a day (BID) | ORAL | 0 refills | Status: DC

## 2023-04-08 NOTE — Discharge Instructions (Addendum)
Instructed patient to take medication with food to completion.  Encouraged increase daily water intake to 64 ounces per day while taking this medication.  Advised if symptoms worsen and/or unresolved please follow-up with PCP or here for further evaluation.

## 2023-04-08 NOTE — ED Triage Notes (Signed)
Pt has had a sinus HA  x 1 week  More pressure on right  side of head  & face OTC - tylenol  - min relief Pt had amoxicillin today for a dentist appt

## 2023-04-08 NOTE — ED Provider Notes (Signed)
[name]    CSN: [id number] Arrival date & time: 04/08/23  1014      History   Chief Complaint Chief Complaint  Patient presents with   Headache    HPI Lance Anderson is a 72 y.o. male.   HPI  Past Medical History:  Diagnosis Date   Abdominal aortic ectasia 03/12/2018   Allergy    Cataract    Complication of anesthesia    low BP after anesthesia,syncope episode.   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   Tear of meniscus of knee 05/09/2021   Chronic right shoulder pain 04/05/2021   COVID-19 virus infection 12/20/2019   Renal insufficiency 02/25/2019   Primary osteoarthritis involving multiple joints 02/24/2019   Cervical radiculopathy 10/30/2018   Right bundle branch block (RBBB) 05/29/2018   Sinus bradycardia 05/29/2018   Peripheral edema 04/27/2018   Venous stasis dermatitis of right lower extremity 04/27/2018   Abdominal aortic ectasia 03/12/2018   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   Recurrent major depressive disorder, in full remission 02/25/2018   Encounter for monitoring statin therapy 08/04/2017   Depression 05/21/2017   Family history of heart disease in male family member before age 35 05/21/2017   Primary osteoarthritis of left knee 05/21/2017   Class 1 obesity due to excess calories with serious comorbidity in adult 05/21/2017   Allergic rhinitis 05/21/2017   DDD (degenerative disc disease), lumbosacral 05/21/2017   Essential hypertension with goal blood pressure less than 140/90 08/17/2015   Pseudophakia of both eyes 10/20/2013   Dyslipidemia 08/10/2013    Past Surgical History:  Procedure Laterality Date   CATARACT  EXTRACTION, BILATERAL     EYE SURGERY     JOINT REPLACEMENT     KNEE ARTHROSCOPY W/ MENISCAL REPAIR Bilateral    REVERSE SHOULDER ARTHROPLASTY Left 06/01/2021   Procedure: REVERSE SHOULDER ARTHROPLASTY;  Surgeon: Yolonda Kida, MD;  Location: [facility];  Service: Orthopedics;  Laterality: Left;   ROTATOR CUFF REPAIR Right    WISDOM TOOTH EXTRACTION         Home Medications    Prior to Admission medications   Medication Sig Start Date End Date Taking? Authorizing Provider  amoxicillin-clavulanate (AUGMENTIN) 875-125 MG tablet Take 1 tablet by mouth 2 (two) times daily for 10 days. 04/08/23 04/18/23 Yes Trevor Iha, FNP  amLODipine (NORVASC) 10 MG tablet TAKE 1 TABLET EVERY DAY 04/07/23   Christen Butter, NP  aspirin 81 MG EC tablet Take 1 tablet (81 mg total) by mouth daily. 02/25/18   Carlis Stable, PA-C  atorvastatin (LIPITOR) 40 MG tablet TAKE 1 TABLET EVERY DAY 04/07/23   Christen Butter, NP  FLUoxetine (PROZAC) 20 MG capsule TAKE 1 CAPSULE EVERY DAY 03/03/23   Christen Butter, NP  fluticasone (FLONASE) 50 MCG/ACT nasal spray USE 2 SPRAYS IN EACH NOSTRIL EVERY DAY 03/18/23   Christen Butter, NP  meloxicam (MOBIC) 15 MG tablet TAKE 1 TABLET BY MOUTH ONCE DAILY IN THE MORNING WITH MEALS FOR 14 DAYS THEN AS NEEDED FOR PAIN 04/26/22   Monica Becton, MD  Misc. Devices MISC Start CPAP at 13 cm. water pressure with EPR 1.  CPAP  machine with a mask and supplies for OSA with AHI 10.  AirFit P10(S) nasal pillow mask or patient preference.  Send to a local DME.  [facility] or first available 01/02/21   [provider]  pantoprazole (PROTONIX) 20 MG tablet Take 1 tablet (20 mg total) by mouth daily. 01/01/23   Christen Butter, NP    Family History Family History  Problem Relation Age of Onset   Dementia Mother    Heart disease Father    Lung cancer Father    Heart attack Brother    Heart attack Paternal Grandfather    Miscarriages / [place] Daughter     Social History Social  History   Tobacco Use   Smoking status: Never   Smokeless tobacco: Never  Vaping Use   Vaping Use: Never used  Substance Use Topics   Alcohol use: Yes    Alcohol/week: 3.0 standard drinks of alcohol    Types: 3 Cans of beer per week    Comment: weekly   Drug use: Never     Allergies   Codeine and Penicillins   Review of Systems Review of Systems  Neurological:  Positive for headaches.     Physical Exam Triage Vital Signs ED Triage Vitals  Enc Vitals Group     BP      Pulse      Resp      Temp      Temp src      SpO2      Weight      Height      Head Circumference      Peak Flow      Pain Score      Pain Loc      Pain Edu?      Excl. in GC?    No data found.  Updated Vital Signs BP (!) 146/84 (BP Location: Right Arm)   Pulse 68   Temp 98.2 F (36.8 C) (Oral)   Resp 16   Ht  (1.803 m)   Wt 240 lb (108.9 kg)   SpO2 98%   BMI 33.47 kg/m    Physical Exam Vitals and nursing note reviewed.  Constitutional:      Appearance: Normal appearance. He is obese. He is not ill-appearing.  HENT:     Head: Normocephalic and atraumatic.     Right Ear: Tympanic membrane and external ear normal.     Left Ear: Tympanic membrane and external ear normal.     Ears:     Comments: Mild eustachian tube dysfunction noted bilaterally    Mouth/Throat:     Mouth: Mucous membranes are moist.     Pharynx: Oropharynx is clear.  Eyes:     Extraocular Movements: Extraocular movements intact.     Conjunctiva/sclera: Conjunctivae normal.     Pupils: Pupils are equal, round, and reactive to light.  Cardiovascular:     Rate and Rhythm: Normal rate and regular rhythm.     Pulses: Normal pulses.     Heart sounds: Normal heart sounds.  Pulmonary:     Effort: Pulmonary effort is normal.     Breath sounds: Normal breath sounds. No wheezing, rhonchi or rales.  Musculoskeletal:        General: Normal range of motion.     Cervical back: Normal range of motion and neck  supple.  Skin:    General: Skin is warm and dry.  Neurological:     General: No focal deficit present.  Mental Status: He is alert and oriented to person, place, and time. Mental status is at baseline.      UC Treatments / Results  Labs (all labs ordered are listed, but only abnormal results are displayed) Labs Reviewed - No data to display  EKG   Radiology No results found.  Procedures Procedures (including critical care time)  Medications Ordered in UC Medications - No data to display  Initial Impression / Assessment and Plan / UC Course  I have reviewed the triage vital signs and the nursing notes.  Pertinent labs & imaging results that were available during my care of the patient were reviewed by me and considered in my medical decision making (see chart for details).     MDM: 1.  Acute maxillary sinusitis, recurrence not specified-Rx'd Augmentin 875/125 mg twice daily x 10 days. Instructed patient to take medication with food to completion.  Encouraged increase daily water intake to 64 ounces per day while taking this medication.  Advised if symptoms worsen and/or unresolved please follow-up with PCP or here for further evaluation. Final Clinical Impressions(s) / UC Diagnoses   Final diagnoses:  Acute maxillary sinusitis, recurrence not specified     Discharge Instructions      Instructed patient to take medication with food to completion.  Encouraged increase daily water intake to 64 ounces per day while taking this medication.  Advised if symptoms worsen and/or unresolved please follow-up with PCP or here for further evaluation.     ED Prescriptions     Medication Sig Dispense Auth. Provider   amoxicillin-clavulanate (AUGMENTIN) 875-125 MG tablet Take 1 tablet by mouth 2 (two) times daily for 10 days. 20 tablet Trevor Iha, FNP      PDMP not reviewed this encounter.   Trevor Iha, FNP 04/08/23 1105

## 2023-04-09 ENCOUNTER — Telehealth: Payer: Self-pay | Admitting: Emergency Medicine

## 2023-04-09 NOTE — Telephone Encounter (Signed)
Call to see how pt was today - taking medication, small improvement - no questions or concerns at this time

## 2023-04-10 ENCOUNTER — Ambulatory Visit
Admission: RE | Admit: 2023-04-10 | Discharge: 2023-04-10 | Disposition: A | Discharge status: Home / Self Care | Payer: Medicare Other | Source: Ambulatory Visit | Attending: Family Medicine | Admitting: Family Medicine

## 2023-04-10 ENCOUNTER — Other Ambulatory Visit: Payer: Self-pay | Admitting: Sports Medicine

## 2023-04-10 VITALS — BP 152/91 | HR 76 | Temp 98.0°F | Resp 14

## 2023-04-10 DIAGNOSIS — B0234 Zoster scleritis: Secondary | ICD-10-CM | POA: Diagnosis not present

## 2023-04-10 DIAGNOSIS — B028 Zoster with other complications: Secondary | ICD-10-CM | POA: Diagnosis not present

## 2023-04-10 DIAGNOSIS — B0239 Other herpes zoster eye disease: Secondary | ICD-10-CM | POA: Diagnosis not present

## 2023-04-10 DIAGNOSIS — B029 Zoster without complications: Secondary | ICD-10-CM

## 2023-04-10 DIAGNOSIS — M1712 Unilateral primary osteoarthritis, left knee: Secondary | ICD-10-CM

## 2023-04-10 HISTORY — DX: Zoster without complications: B02.9

## 2023-04-10 MED ORDER — GABAPENTIN 300 MG PO CAPS: 300.0000 mg | ORAL_CAPSULE | Freq: Three times a day (TID) | ORAL | 0 refills | Status: DC

## 2023-04-10 MED ORDER — OXYCODONE-ACETAMINOPHEN 5-325 MG PO TABS: 1.0000 | ORAL_TABLET | Freq: Three times a day (TID) | ORAL | 0 refills | Status: DC | PRN

## 2023-04-10 MED ORDER — VALACYCLOVIR HCL 1 G PO TABS: 1000.0000 mg | ORAL_TABLET | Freq: Three times a day (TID) | ORAL | 0 refills | Status: DC

## 2023-04-10 MED ORDER — KETOROLAC TROMETHAMINE 30 MG/ML IJ SOLN
60.0000 mg | Freq: Once | INTRAMUSCULAR | Status: AC
  Administered 2023-04-10: 60 mg via INTRAMUSCULAR

## 2023-04-10 MED ORDER — PREDNISONE 50 MG PO TABS: ORAL_TABLET | ORAL | 0 refills | Status: DC

## 2023-04-10 NOTE — ED Provider Notes (Signed)
[facility]    CSN: [id number] Arrival date & time: 04/10/23  4098      History   Chief Complaint Chief Complaint  Patient presents with   Headache    Entered by patient    HPI Lance Anderson is a 72 y.o. male.   HPI Patient was seen on Tuesday, 04/08/2023 and diagnosed with a sinus infection.  Treated with Augmentin.  He is here because he has continued pain around his right eye and in his forehead.  In addition after his visit he broke out in a rash in his forehead.  He has eye irritation on the right.  Slight vision blurring, but he thinks it is because his eye is tearing. He had a Zostavax immunization perhaps 20 years ago.  He has not had the updated Shingrix vaccinations His headache pain is severe.  He rates it as a 10.  He states he cannot remember for many years having this much pain. Past Medical History:  Diagnosis Date   Abdominal aortic ectasia 03/12/2018   Allergy    Cataract    Complication of anesthesia    low BP after anesthesia,syncope episode.   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   Tear of meniscus of knee 05/09/2021   Chronic right shoulder pain 04/05/2021   COVID-19 virus infection 12/20/2019   Renal insufficiency 02/25/2019   Primary osteoarthritis involving multiple joints 02/24/2019   Cervical radiculopathy 10/30/2018   Right bundle branch block (RBBB) 05/29/2018   Sinus bradycardia 05/29/2018   Peripheral edema 04/27/2018   Venous stasis dermatitis of right lower extremity 04/27/2018   Abdominal aortic ectasia 03/12/2018   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   Recurrent major depressive disorder, in full remission 02/25/2018    Encounter for monitoring statin therapy 08/04/2017   Depression 05/21/2017   Family history of heart disease in male family member before age 64 05/21/2017   Primary osteoarthritis of left knee 05/21/2017   Class 1 obesity due to excess calories with serious comorbidity in adult 05/21/2017   Allergic rhinitis 05/21/2017   DDD (degenerative disc disease), lumbosacral 05/21/2017   Essential hypertension with goal blood pressure less than 140/90 08/17/2015   Pseudophakia of both eyes 10/20/2013   Dyslipidemia 08/10/2013    Past Surgical History:  Procedure Laterality Date   CATARACT EXTRACTION, BILATERAL     EYE SURGERY     JOINT REPLACEMENT     KNEE ARTHROSCOPY W/ MENISCAL REPAIR Bilateral    REVERSE SHOULDER ARTHROPLASTY Left 06/01/2021   Procedure: REVERSE SHOULDER ARTHROPLASTY;  Surgeon: Yolonda Kida, MD;  Location: [facility];  Service: Orthopedics;  Laterality: Left;   ROTATOR CUFF REPAIR Right    WISDOM TOOTH EXTRACTION         Home Medications    Prior to Admission medications   Medication Sig Start Date End Date Taking? Authorizing Provider  gabapentin (NEURONTIN) 300 MG capsule Take 1 capsule (300 mg total) by mouth 3 (three) times daily. 04/10/23  Yes Eustace Moore, MD  oxyCODONE-acetaminophen (PERCOCET/ROXICET) 5-325 MG tablet Take 1-2 tablets by mouth every 8 (eight) hours as needed for severe pain. 04/10/23  Yes Delton See,  Letta Pate, MD  predniSONE (DELTASONE) 50 MG tablet Take once a day for 5 days.  Take with food 04/10/23  Yes Eustace Moore, MD  valACYclovir (VALTREX) 1000 MG tablet Take 1 tablet (1,000 mg total) by mouth 3 (three) times daily. 04/10/23  Yes Eustace Moore, MD  amLODipine (NORVASC) 10 MG tablet TAKE 1 TABLET EVERY DAY 04/07/23   Christen Butter, NP  amoxicillin-clavulanate (AUGMENTIN) 875-125 MG tablet Take 1 tablet by mouth 2 (two) times daily for 10 days. 04/08/23 04/18/23  Trevor Iha, FNP  aspirin 81 MG EC tablet Take 1 tablet (81 mg  total) by mouth daily. 02/25/18   Carlis Stable, PA-C  atorvastatin (LIPITOR) 40 MG tablet TAKE 1 TABLET EVERY DAY 04/07/23   Christen Butter, NP  FLUoxetine (PROZAC) 20 MG capsule TAKE 1 CAPSULE EVERY DAY 03/03/23   Christen Butter, NP  fluticasone (FLONASE) 50 MCG/ACT nasal spray USE 2 SPRAYS IN EACH NOSTRIL EVERY DAY 03/18/23   Christen Butter, NP  meloxicam (MOBIC) 15 MG tablet TAKE 1 TABLET BY MOUTH ONCE DAILY IN THE MORNING WITH MEALS FOR 14 DAYS THEN AS NEEDED FOR PAIN 04/26/22   Monica Becton, MD  Misc. Devices MISC Start CPAP at 13 cm. water pressure with EPR 1.  CPAP machine with a mask and supplies for OSA with AHI 10.  AirFit P10(S) nasal pillow mask or patient preference.  Send to a local DME.  [facility] or first available 01/02/21   [provider]  pantoprazole (PROTONIX) 20 MG tablet Take 1 tablet (20 mg total) by mouth daily. 01/01/23   Christen Butter, NP    Family History Family History  Problem Relation Age of Onset   Dementia Mother    Heart disease Father    Lung cancer Father    Heart attack Brother    Heart attack Paternal Grandfather    Miscarriages / [place] Daughter     Social History Social History   Tobacco Use   Smoking status: Never   Smokeless tobacco: Never  Vaping Use   Vaping Use: Never used  Substance Use Topics   Alcohol use: Yes    Alcohol/week: 3.0 standard drinks of alcohol    Types: 3 Cans of beer per week    Comment: weekly   Drug use: Never     Allergies   Codeine and Penicillins   Review of Systems Review of Systems See HPI  Physical Exam Triage Vital Signs ED Triage Vitals  Enc Vitals Group     BP 04/10/23 0819 (!) 152/91     Pulse Rate 04/10/23 0819 76     Resp 04/10/23 0819 14     Temp 04/10/23 0819 98 F (36.7 C)     Temp Source 04/10/23 0819 Oral     SpO2 04/10/23 0819 96 %     Weight --      Height --      Head Circumference --      Peak Flow --      Pain Score 04/10/23 0820 9     Pain Loc --       Pain Edu? --      Excl. in GC? --    No data found.  Updated Vital Signs BP (!) 152/91 (BP Location: Right Arm)   Pulse 76   Temp 98 F (36.7 C) (Oral)   Resp 14   SpO2 96%      Physical Exam Constitutional:      General:  He is in acute distress.     Appearance: He is well-developed. He is ill-appearing.     Comments: Appears uncomfortable.  Is squinting the right eye.  HENT:     Head: Normocephalic and atraumatic.      Comments: Shingles rash spares the eyelid and external auditory canal. Eyes:     General: Lids are normal. Vision grossly intact. Gaze aligned appropriately.     Conjunctiva/sclera:     Right eye: Right conjunctiva is injected.     Pupils: Pupils are equal, round, and reactive to light.   Cardiovascular:     Rate and Rhythm: Normal rate.  Pulmonary:     Effort: Pulmonary effort is normal. No respiratory distress.  Abdominal:     General: There is no distension.     Palpations: Abdomen is soft.  Musculoskeletal:        General: Normal range of motion.     Cervical back: Normal range of motion.  Skin:    General: Skin is warm and dry.  Neurological:     Mental Status: He is alert.      UC Treatments / Results  Labs (all labs ordered are listed, but only abnormal results are displayed) Labs Reviewed - No data to display  EKG   Radiology No results found.  Procedures Procedures (including critical care time)  Medications Ordered in UC Medications  ketorolac (TORADOL) 30 MG/ML injection 60 mg (60 mg Intramuscular Given 04/10/23 0837)    Initial Impression / Assessment and Plan / UC Course  I have reviewed the triage vital signs and the nursing notes.  Pertinent labs & imaging results that were available during my care of the patient were reviewed by me and considered in my medical decision making (see chart for details).     Patient has herpes zoster and ophthalmic nerve distribution.  Questionable ophthalmic involvement, I do not see  dendritic lesions but he is very uncomfortable.  I called Dr. Vanessa Barbara who is on-call for ophthalmology at the hospital.  He kindly took the call and gave advice that patient should see an eye doctor within the next day or 2.  The closest eye and [facility] are the [facility] surgeons.  I called their office and they kindly agreed to see him today. I am treating him with acyclovir which is indicated.  I understand that steroids are questionable to prevent postherpetic neuralgia but his pain is so severe that I think it is appropriate to give him all treatment available.  I also gave him gabapentin for the nerve pain as well as oxycodone.  He should follow with his primary care doctor if not improving by next week Final Clinical Impressions(s) / UC Diagnoses   Final diagnoses:  Zoster scleritis  Herpes zoster with other complication     Discharge Instructions      The valacyclovir 3 times a day for 7 days.  This is an antiviral medication Taking gabapentin 3 times a day as needed for nerve pain.  This may cause drowsiness.  It is good to use at bedtime. Take the prednisone once a day for 5 days.  This helps take down inflammation.  It may help prevent post shingles neuropathy Take the oxycodone as needed for severe pain.  Do not drive on oxycodone  Go now to the [facility] eye surgeons.  They are in the factory downtown [place].  Address is [address] Their phone number is [phone].  They have a working  appointment at 950    ED Prescriptions     Medication Sig Dispense Auth. Provider   valACYclovir (VALTREX) 1000 MG tablet Take 1 tablet (1,000 mg total) by mouth 3 (three) times daily. 21 tablet Eustace Moore, MD   gabapentin (NEURONTIN) 300 MG capsule Take 1 capsule (300 mg total) by mouth 3 (three) times daily. 30 capsule Eustace Moore, MD   predniSONE (DELTASONE) 50 MG tablet Take once a day for 5 days.  Take with food 5 tablet Eustace Moore, MD    oxyCODONE-acetaminophen (PERCOCET/ROXICET) 5-325 MG tablet Take 1-2 tablets by mouth every 8 (eight) hours as needed for severe pain. 20 tablet Eustace Moore, MD      I have reviewed the PDMP during this encounter.   Eustace Moore, MD 04/10/23 [phone]

## 2023-04-10 NOTE — ED Triage Notes (Signed)
Pt presents with c/o continued HA continued from last visit.

## 2023-04-10 NOTE — Discharge Instructions (Addendum)
The valacyclovir 3 times a day for 7 days.  This is an antiviral medication Taking gabapentin 3 times a day as needed for nerve pain.  This may cause drowsiness.  It is good to use at bedtime. Take the prednisone once a day for 5 days.  This helps take down inflammation.  It may help prevent post shingles neuropathy Take the oxycodone as needed for severe pain.  Do not drive on oxycodone  Go now to the [facility] eye surgeons.  They are in the factory downtown [place].  Address is [address] Their phone number is [phone].  They have a working appointment at 950

## 2023-04-16 ENCOUNTER — Ambulatory Visit (INDEPENDENT_AMBULATORY_CARE_PROVIDER_SITE_OTHER): Payer: Medicare Other | Admitting: Medical-Surgical

## 2023-04-16 DIAGNOSIS — Z0000 Encounter for general adult medical examination without abnormal findings: Secondary | ICD-10-CM

## 2023-04-16 NOTE — Progress Notes (Signed)
MEDICARE ANNUAL WELLNESS VISIT  04/16/2023  Telephone Visit Disclaimer This Medicare AWV was conducted by telephone due to national recommendations for restrictions regarding the COVID-19 Pandemic (e.g. social distancing).  I verified, using two identifiers, that I am speaking with Lance Anderson or their authorized healthcare agent. I discussed the limitations, risks, security, and privacy concerns of performing an evaluation and management service by telephone and the potential availability of an in-person appointment in the future. The patient expressed understanding and agreed to proceed.  Location of Patient: Home Location of Provider (nurse):  In the office.  Subjective:    Lance Anderson is a 72 y.o. male patient of Christen Butter, NP who had a Medicare Annual Wellness Visit today via telephone. 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 he has 1 child. he reports that he is socially active and does interact with friends/family regularly. he is moderately physically active and enjoys playing with grandkids in the yard.  Patient Care Team: Christen Butter, NP as PCP - General (Nurse Practitioner)     04/16/2023    8:10 AM 04/08/2022    8:08 AM 05/23/2021    9:17 AM 04/17/2021    9:33 AM 02/07/2021    9:11 AM 09/20/2019   10:06 AM 11/04/2018    8:02 AM  Advanced Directives  Does Patient Have a Medical Advance Directive? Yes Yes Yes Yes No No No  Type of Advance Directive Living will Living will Living will;Healthcare Power of [employer] Power of [place];Living will     Does patient want to make changes to medical advance directive? No - Patient declined No - Patient declined       Copy of Healthcare Power of Attorney in Chart?    No - copy requested     Would patient like information on creating a medical advance directive?     No - Patient declined No - Patient declined No - Patient declined    Hospital Utilization Over the Past 12 Months: #  of hospitalizations or ER visits: 0 # of surgeries: 0  Review of Systems    Patient reports that his overall health is better compared to last year.  History obtained from chart review and the patient  Patient Reported Readings (BP, Pulse, CBG, Weight, etc) none  Pain Assessment Pain : 0-10 Pain Score: 5  Pain Type: Neuropathic pain Pain Location: Other (Comment) (shingles pain across his head) Pain Descriptors / Indicators: Headache Pain Onset: 1 to 4 weeks ago Pain Frequency: Intermittent Pain Relieving Factors: currently on valcyclovir and gabapentin  Pain Relieving Factors: currently on valcyclovir and gabapentin  Current Medications & Allergies (verified) Allergies as of 04/16/2023       Reactions   Codeine Other (See Comments)   Chest pain   Penicillins Diarrhea   Reaction: 30 years        Medication List        Accurate as of April 16, 2023  8:18 AM. If you have any questions, ask your nurse or doctor.          STOP taking these medications    amoxicillin-clavulanate 875-125 MG tablet Commonly known as: AUGMENTIN       TAKE these medications    amLODipine 10 MG tablet Commonly known as: NORVASC TAKE 1 TABLET EVERY DAY   aspirin EC 81 MG tablet Take 1 tablet (81 mg total) by mouth daily.   atorvastatin 40 MG tablet Commonly known as: LIPITOR TAKE  1 TABLET EVERY DAY   FLUoxetine 20 MG capsule Commonly known as: PROZAC TAKE 1 CAPSULE EVERY DAY   fluticasone 50 MCG/ACT nasal spray Commonly known as: FLONASE USE 2 SPRAYS IN EACH NOSTRIL EVERY DAY   gabapentin 300 MG capsule Commonly known as: Neurontin Take 1 capsule (300 mg total) by mouth 3 (three) times daily.   meloxicam 15 MG tablet Commonly known as: MOBIC TAKE 1 TABLET BY MOUTH ONCE DAILY IN THE MORNING WITH MEALS FOR 14 DAYS THEN AS NEEDED FOR PAIN   Misc. Devices Misc Start CPAP at 13 cm. water pressure with EPR 1.  CPAP machine with a mask and supplies for OSA with AHI 10.   AirFit P10(S) nasal pillow mask or patient preference.  Send to a local DME.  [facility] or first available   oxyCODONE-acetaminophen 5-325 MG tablet Commonly known as: PERCOCET/ROXICET Take 1-2 tablets by mouth every 8 (eight) hours as needed for severe pain.   pantoprazole 20 MG tablet Commonly known as: PROTONIX Take 1 tablet (20 mg total) by mouth daily.   predniSONE 50 MG tablet Commonly known as: DELTASONE Take once a day for 5 days.  Take with food   valACYclovir 1000 MG tablet Commonly known as: VALTREX Take 1 tablet (1,000 mg total) by mouth 3 (three) times daily.        History (reviewed): Past Medical History:  Diagnosis Date   Abdominal aortic ectasia 03/12/2018   Allergy    Cataract    Complication of anesthesia    low BP after anesthesia,syncope episode.   DDD (degenerative disc disease), lumbar    Depression    Hyperlipidemia    Hypertension    RBBB    Renal insufficiency 02/25/2019   Shingles 04/10/2023   Sleep apnea    Vasovagal syncope    Past Surgical History:  Procedure Laterality Date   CATARACT EXTRACTION, BILATERAL     EYE SURGERY     JOINT REPLACEMENT     KNEE ARTHROSCOPY W/ MENISCAL REPAIR Bilateral    REVERSE SHOULDER ARTHROPLASTY Left 06/01/2021   Procedure: REVERSE SHOULDER ARTHROPLASTY;  Surgeon: Yolonda Kida, MD;  Location: [facility];  Service: Orthopedics;  Laterality: Left;   ROTATOR CUFF REPAIR Right    WISDOM TOOTH EXTRACTION     Family History  Problem Relation Age of Onset   Dementia Mother    Heart disease Father    Lung cancer Father    Heart attack Brother    Heart attack Paternal Grandfather    Miscarriages / [place] Daughter    Social History   Socioeconomic History   Marital status: Married    Spouse name: Joyce Gross   Number of children: 1   Years of education: 12   Highest education level: 12th grade  Occupational History   Occupation: Curator    Comment: retired  Tobacco Use   Smoking status: Never    Smokeless tobacco: Never  Vaping Use   Vaping Use: Never used  Substance and Sexual Activity   Alcohol use: Yes    Alcohol/week: 3.0 standard drinks of alcohol    Types: 3 Cans of beer per week    Comment: weekly   Drug use: Never   Sexual activity: Not on file  Other Topics Concern   Not on file  Social History Narrative   Works part time driving a truck. Plays with grandkids in the yard.   Social Determinants of Health   Financial Resource Strain: Low Risk  (04/15/2023)  Overall Financial Resource Strain (CARDIA)    Difficulty of Paying Living Expenses: Not hard at all  Food Insecurity: No Food Insecurity (04/15/2023)   Hunger Vital Sign    Worried About Running Out of Food in the Last Year: Never true    Ran Out of Food in the Last Year: Never true  Transportation Needs: No Transportation Needs (04/15/2023)   PRAPARE - Administrator, Civil Service (Medical): No    Lack of Transportation (Non-Medical): No  Physical Activity: Sufficiently Active (04/15/2023)   Exercise Vital Sign    Days of Exercise per Week: 4 days    Minutes of Exercise per Session: 120 min  Stress: No Stress Concern Present (04/15/2023)   [employer] of Occupational Health - Occupational Stress Questionnaire    Feeling of Stress : Not at all  Social Connections: Moderately Integrated (04/16/2023)   Social Connection and Isolation Panel [NHANES]    Frequency of Communication with Friends and Family: More than three times a week    Frequency of Social Gatherings with Friends and Family: More than three times a week    Attends Religious Services: More than 4 times per year    Active Member of [employer] or Organizations: No    Attends Banker Meetings: Never    Marital Status: Married    Activities of Daily Living    04/15/2023    4:05 PM  In your present state of health, do you have any difficulty performing the following activities:  Hearing? 0  Vision? 1  Difficulty  concentrating or making decisions? 1  Walking or climbing stairs? 0  Dressing or bathing? 0  Doing errands, shopping? 1  Preparing Food and eating ? N  Using the Toilet? N  In the past six months, have you accidently leaked urine? N  Do you have problems with loss of bowel control? Y  Managing your Medications? Y  Managing your Finances? N  Housekeeping or managing your Housekeeping? N    Patient Education/ Literacy How often do you need to have someone help you when you read instructions, pamphlets, or other written materials from your doctor or pharmacy?: 1 - Never What is the last grade level you completed in school?: 12th grade  Exercise Current Exercise Habits: Home exercise routine, Type of exercise: walking, Time (Minutes): > 60, Frequency (Times/Week): 4, Weekly Exercise (Minutes/Week): 0, Intensity: Moderate, Exercise limited by: None identified  Diet Patient reports consuming 3 meals a day and 2 snack(s) a day Patient reports that his primary diet is: Regular Patient reports that she does have regular access to food.   Depression Screen    04/16/2023    8:09 AM 06/18/2022    9:13 AM 04/08/2022    8:09 AM 01/07/2022    9:13 AM 10/10/2021    9:16 AM 07/05/2021   10:10 AM 05/11/2021    1:29 PM  PHQ 2/9 Scores  PHQ - 2 Score 0 0 0 0 1 1 0  PHQ- 9 Score  1  0  3 0     Fall Risk    04/16/2023    8:10 AM 04/15/2023    4:05 PM 06/18/2022    9:12 AM 04/05/2022    9:44 AM 01/07/2022    9:12 AM  Fall Risk   Falls in the past year? 0 1 0 0 1  Number falls in past yr: 0 1 0 0 1  Injury with Fall? 0 0 0 0 1  Risk  for fall due to : No Fall Risks  No Fall Risks No Fall Risks   Follow up Falls evaluation completed  Falls evaluation completed Falls evaluation completed Falls evaluation completed     ObjectiDawsen Kriegery Colucci seemed alert and oriented and he participated appropriately during our telephone visit.  Blood Pressure Weight BMI  BP Readings from Last 3 Encounters:   04/10/23 (!) 152/91  04/08/23 (!) 146/84  01/01/23 (!) 147/75   Wt Readings from Last 3 Encounters:  04/08/23 240 lb (108.9 kg)  01/01/23 244 lb 11.2 oz (111 kg)  06/18/22 241 lb 1.3 oz (109.4 kg)   BMI Readings from Last 1 Encounters:  04/08/23 33.47 kg/m    *Unable to obtain current vital signs, weight, and BMI due to telephone visit type  Hearing/Vision  Zaydan did not seem to have difficulty with hearing/understanding during the telephone conversation Reports that he has had a formal eye exam by an eye care professional within the past year Reports that he has not had a formal hearing evaluation within the past year *Unable to fully assess hearing and vision during telephone visit type  Cognitive Function:    04/16/2023    8:11 AM 04/08/2022    8:15 AM 02/07/2021    9:18 AM 09/20/2019   10:10 AM 05/29/2018    8:40 AM  6CIT Screen  What Year? 0 points 0 points 0 points 0 points 0 points  What month? 0 points 0 points 0 points 0 points 0 points  What time? 0 points 0 points 0 points 0 points 0 points  Count back from 20 0 points 0 points 0 points 0 points 0 points  Months in reverse 0 points 0 points 0 points 0 points 0 points  Repeat phrase 0 points 0 points 0 points 2 points 0 points  Total Score 0 points 0 points 0 points 2 points 0 points   (Normal:0-7, Significant for Dysfunction: >8)  Normal Cognitive Function Screening: Yes   Immunization & Health Maintenance Record Immunization History  Administered Date(s) Administered   Pneumococcal Conjugate-13 10/20/2020   Td 10/09/2002   Tdap 12/30/2012   Zoster, Live 08/10/2013    Health Maintenance  Topic Date Due   DTaP/Tdap/Td (3 - Td or Tdap) 12/30/2022   Zoster Vaccines- Shingrix (1 of 2) 07/16/2023 (Originally 01/17/1970)   Pneumonia Vaccine 54+ Years old (2 of 2 - PPSV23 or PCV20) 04/15/2024 (Originally 12/15/2020)   INFLUENZA VACCINE  07/24/2023   Medicare Annual Wellness (AWV)  04/15/2024   COLONOSCOPY (Pts  45-77yrs Insurance coverage will need to be confirmed)  06/04/2024   Hepatitis C Screening  Completed   HPV VACCINES  Aged Out   COVID-19 Vaccine  Discontinued       Assessment  This is a routine wellness examination for [employer].  Health Maintenance: Due or Overdue Health Maintenance Due  Topic Date Due   DTaP/Tdap/Td (3 - Td or Tdap) 12/30/2022    Lance Anderson does not need a referral for Community Assistance: Care Management:   no Social Work:    no Prescription Assistance:  no Nutrition/Diabetes Education:  no   Plan:  Personalized Goals  Goals Addressed               This Visit's Progress     Patient Stated (pt-stated)        Patient stated that he would like to loose weight.       Personalized Health Maintenance & Screening Recommendations  Pneumococcal  vaccine  Td vaccine Shingles vaccine  Lung Cancer Screening Recommended: no (Low Dose CT Chest recommended if Age 42-80 years, 30 pack-year currently smoking OR have quit w/in past 15 years) Hepatitis C Screening recommended: no HIV Screening recommended: no  Advanced Directives: Written information was not prepared per patient's request.  Referrals & Orders No orders of the defined types were placed in this encounter.   Follow-up Plan Follow-up with Christen Butter, NP as planned Schedule td vaccine and shingles vaccine at the pharmacy once you are feeling better.  Pneumonia vaccine can be done in the office once you are feeling better. Medicare wellness visit in one year.  Patient will access AVS on my chart.   I have personally reviewed and noted the following in the patient's chart:   Medical and social history Use of alcohol, tobacco or illicit drugs  Current medications and supplements Functional ability and status Nutritional status Physical activity Advanced directives List of other physicians Hospitalizations, surgeries, and ER visits in previous 12 months Vitals Screenings to  include cognitive, depression, and falls Referrals and appointments  In addition, I have reviewed and discussed with Lance Anderson certain preventive protocols, quality metrics, and best practice recommendations. A written personalized care plan for preventive services as well as general preventive health recommendations is available and can be mailed to the patient at his request.      Modesto Charon, RN BSN  04/16/2023

## 2023-04-16 NOTE — Patient Instructions (Signed)
MEDICARE ANNUAL WELLNESS VISIT Health Maintenance Summary and Written Plan of Care  Lance Anderson ,  Thank you for allowing me to perform your Medicare Annual Wellness Visit and for your ongoing commitment to your health.   Health Maintenance & Immunization History Health Maintenance  Topic Date Due   DTaP/Tdap/Td (3 - Td or Tdap) 12/30/2022   Zoster Vaccines- Shingrix (1 of 2) 07/16/2023 (Originally 01/17/1970)   Pneumonia Vaccine 78+ Years old (2 of 2 - PPSV23 or PCV20) 04/15/2024 (Originally 12/15/2020)   INFLUENZA VACCINE  07/24/2023   Medicare Annual Wellness (AWV)  04/15/2024   COLONOSCOPY (Pts 45-32yrs Insurance coverage will need to be confirmed)  06/04/2024   Hepatitis C Screening  Completed   HPV VACCINES  Aged Out   COVID-19 Vaccine  Discontinued   Immunization History  Administered Date(s) Administered   Pneumococcal Conjugate-13 10/20/2020   Td 10/09/2002   Tdap 12/30/2012   Zoster, Live 08/10/2013    These are the patient goals that we discussed:  Goals Addressed               This Visit's Progress     Patient Stated (pt-stated)        Patient stated that he would like to loose weight.         This is a list of Health Maintenance Items that are overdue or due now: Health Maintenance Due  Topic Date Due   DTaP/Tdap/Td (3 - Td or Tdap) 12/30/2022   Pneumococcal vaccine  Td vaccine Shingles vaccine  Orders/Referrals Placed Today: No orders of the defined types were placed in this encounter.  (Contact our referral department at [phone] if you have not spoken with someone about your referral appointment within the next 5 days)    Follow-up Plan Follow-up with Christen Butter, NP as planned Schedule td vaccine and shingles vaccine at the pharmacy once you are feeling better.  Pneumonia vaccine can be done in the office once you are feeling better. Medicare wellness visit in one year.  Patient will access AVS on my chart.     Health  Maintenance, Male Adopting a healthy lifestyle and getting preventive care are important in promoting health and wellness. Ask your health care provider about: The right schedule for you to have regular tests and exams. Things you can do on your own to prevent diseases and keep yourself healthy. What should I know about diet, weight, and exercise? Eat a healthy diet  Eat a diet that includes plenty of vegetables, fruits, low-fat dairy products, and lean protein. Do not eat a lot of foods that are high in solid fats, added sugars, or sodium. Maintain a healthy weight Body mass index (BMI) is a measurement that can be used to identify possible weight problems. It estimates body fat based on height and weight. Your health care provider can help determine your BMI and help you achieve or maintain a healthy weight. Get regular exercise Get regular exercise. This is one of the most important things you can do for your health. Most adults should: Exercise for at least 150 minutes each week. The exercise should increase your heart rate and make you sweat (moderate-intensity exercise). Do strengthening exercises at least twice a week. This is in addition to the moderate-intensity exercise. Spend less time sitting. Even light physical activity can be beneficial. Watch cholesterol and blood lipids Have your blood tested for lipids and cholesterol at 70 years of age, then have this test every 5 years. You may need  to have your cholesterol levels checked more often if: Your lipid or cholesterol levels are high. You are older than 70 years of age. You are at high risk for heart disease. What should I know about cancer screening? Many types of cancers can be detected early and may often be prevented. Depending on your health history and family history, you may need to have cancer screening at various ages. This may include screening for: Colorectal cancer. Prostate cancer. Skin cancer. Lung cancer. What  should I know about heart disease, diabetes, and high blood pressure? Blood pressure and heart disease High blood pressure causes heart disease and increases the risk of stroke. This is more likely to develop in people who have high blood pressure readings or are overweight. Talk with your health care provider about your target blood pressure readings. Have your blood pressure checked: Every 3-5 years if you are 4-5 years of age. Every year if you are 55 years old or older. If you are between the ages of 65 and 52 and are a current or former smoker, ask your health care provider if you should have a one-time screening for abdominal aortic aneurysm (AAA). Diabetes Have regular diabetes screenings. This checks your fasting blood sugar level. Have the screening done: Once every three years after age 53 if you are at a normal weight and have a low risk for diabetes. More often and at a younger age if you are overweight or have a high risk for diabetes. What should I know about preventing infection? Hepatitis B If you have a higher risk for hepatitis B, you should be screened for this virus. Talk with your health care provider to find out if you are at risk for hepatitis B infection. Hepatitis C Blood testing is recommended for: Everyone born from [birth year] through 1963. Anyone with known risk factors for hepatitis C. Sexually transmitted infections (STIs) You should be screened each year for STIs, including gonorrhea and chlamydia, if: You are sexually active and are younger than 70 years of age. You are older than 70 years of age and your health care provider tells you that you are at risk for this type of infection. Your sexual activity has changed since you were last screened, and you are at increased risk for chlamydia or gonorrhea. Ask your health care provider if you are at risk. Ask your health care provider about whether you are at high risk for HIV. Your health care provider may recommend a  prescription medicine to help prevent HIV infection. If you choose to take medicine to prevent HIV, you should first get tested for HIV. You should then be tested every 3 months for as long as you are taking the medicine. Follow these instructions at home: Alcohol use Do not drink alcohol if your health care provider tells you not to drink. If you drink alcohol: Limit how much you have to 0-2 drinks a day. Know how much alcohol is in your drink. In the [place] one drink equals one 12 oz bottle of beer (355 mL), one 5 oz glass of wine (148 mL), or one 1 oz glass of hard liquor (44 mL). Lifestyle Do not use any products that contain nicotine or tobacco. These products include cigarettes, chewing tobacco, and vaping devices, such as e-cigarettes. If you need help quitting, ask your health care provider. Do not use street drugs. Do not share needles. Ask your health care provider for help if you need support or information about quitting drugs. General  instructions Schedule regular health, dental, and eye exams. Stay current with your vaccines. Tell your health care provider if: You often feel depressed. You have ever been abused or do not feel safe at home. Summary Adopting a healthy lifestyle and getting preventive care are important in promoting health and wellness. Follow your health care provider's instructions about healthy diet, exercising, and getting tested or screened for diseases. Follow your health care provider's instructions on monitoring your cholesterol and blood pressure. This information is not intended to replace advice given to you by your health care provider. Make sure you discuss any questions you have with your health care provider. Document Revised: 04/30/2021 Document Reviewed: 04/30/2021 Elsevier Patient Education  [place].

## 2023-04-21 DIAGNOSIS — B0239 Other herpes zoster eye disease: Secondary | ICD-10-CM | POA: Diagnosis not present

## 2023-04-24 ENCOUNTER — Ambulatory Visit
Admission: RE | Admit: 2023-04-24 | Discharge: 2023-04-24 | Disposition: A | Discharge status: Home / Self Care | Payer: Medicare Other | Source: Ambulatory Visit | Attending: Family Medicine | Admitting: Family Medicine

## 2023-04-24 VITALS — BP 128/81 | HR 70 | Temp 98.5°F | Resp 16

## 2023-04-24 DIAGNOSIS — B0222 Postherpetic trigeminal neuralgia: Secondary | ICD-10-CM

## 2023-04-24 MED ORDER — AMITRIPTYLINE HCL 10 MG PO TABS: 10.0000 mg | ORAL_TABLET | Freq: Every day | ORAL | 1 refills | Status: DC

## 2023-04-24 MED ORDER — ONDANSETRON HCL 8 MG PO TABS: 8.0000 mg | ORAL_TABLET | Freq: Three times a day (TID) | ORAL | 0 refills | Status: DC | PRN

## 2023-04-24 NOTE — Discharge Instructions (Signed)
Take the amitriptylline at bedtime If the 10 mg tab does not help in a few days increase to 2 pills May continue acetaminophen or try excedrin migraine  See Christen Butter in follow up   Call today for an appointment in 2 weeks

## 2023-04-24 NOTE — ED Triage Notes (Signed)
Follow up from shingles resolution. Continues with headache and nausea. Reports headache pain continues to worsen. Not taking any OTC meds to help with symptoms.

## 2023-04-24 NOTE — ED Provider Notes (Signed)
[name]    CSN: [id number] Arrival date & time: 04/24/23  0801      History   Chief Complaint Chief Complaint  Patient presents with   Follow-up    Entered by patient    HPI Levy Cedano is a 72 y.o. male.   HPI  Saw patient a couple weeks ago for headache.  When he presented for headache he was noted to have vesicles on his right forehead and scalp down to his eye, with conjunctival injection in the right eye.  He was treated for herpes zoster.  I also sent him to ophthalmology for consult.  He was given acyclovir, steroids, and gabapentin.  His rash is improved although he still has some "scabs" in his hair.  Vision is normal.  No eye irritation.  No vision loss.  Patient is concerned because he continues to have scalp pain and headache.  This is present daily.  It is not resolved with Tylenol.  He did not feel that the gabapentin gave him any improvement in the pain.  He took the oxycodone infrequently because it made him "feel bad".  He takes meloxicam on a daily basis, therefore did not try ibuprofen for headache pain.  He does not have headaches normally, no migraines or headache condition previous  Past Medical History:  Diagnosis Date   Abdominal aortic ectasia (HCC) 03/12/2018   Allergy    Cataract    Complication of anesthesia    low BP after anesthesia,syncope episode.   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   Tear of meniscus of knee 05/09/2021   Chronic right shoulder pain 04/05/2021   COVID-19 virus infection 12/20/2019   Renal insufficiency 02/25/2019   Primary osteoarthritis involving multiple joints 02/24/2019   Cervical  radiculopathy 10/30/2018   Right bundle branch block (RBBB) 05/29/2018   Sinus bradycardia 05/29/2018   Peripheral edema 04/27/2018   Venous stasis dermatitis of right lower extremity 04/27/2018   Abdominal aortic ectasia (HCC) 03/12/2018   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   Recurrent major depressive disorder, in full remission (HCC) 02/25/2018   Encounter for monitoring statin therapy 08/04/2017   Depression 05/21/2017   Family history of heart disease in male family member before age 26 05/21/2017   Primary osteoarthritis of left knee 05/21/2017   Class 1 obesity due to excess calories with serious comorbidity in adult 05/21/2017   Allergic rhinitis 05/21/2017   DDD (degenerative disc disease), lumbosacral 05/21/2017   Essential hypertension with goal blood pressure less than 140/90 08/17/2015   Pseudophakia of both eyes 10/20/2013   Dyslipidemia 08/10/2013    Past Surgical History:  Procedure Laterality Date   CATARACT EXTRACTION, BILATERAL     EYE SURGERY     JOINT REPLACEMENT     KNEE ARTHROSCOPY W/ MENISCAL REPAIR Bilateral    REVERSE SHOULDER ARTHROPLASTY Left 06/01/2021   Procedure: REVERSE SHOULDER ARTHROPLASTY;  Surgeon: Yolonda Kida, MD;  Location: [facility];  Service: Orthopedics;  Laterality: Left;   ROTATOR CUFF REPAIR Right    WISDOM TOOTH EXTRACTION         Home Medications  Prior to Admission medications   Medication Sig Start Date End Date Taking? Authorizing Provider  amitriptyline (ELAVIL) 10 MG tablet Take 1 tablet (10 mg total) by mouth at bedtime. If no improvement in a week take 2 at night 04/24/23  Yes Eustace Moore, MD  ondansetron (ZOFRAN) 8 MG tablet Take 1 tablet (8 mg total) by mouth every 8 (eight) hours as needed for nausea or vomiting. 04/24/23  Yes Eustace Moore, MD  amLODipine (NORVASC) 10 MG tablet TAKE 1 TABLET EVERY DAY 04/07/23   Christen Butter, NP  aspirin 81 MG EC tablet Take 1 tablet (81 mg total) by mouth  daily. 02/25/18   Carlis Stable, PA-C  atorvastatin (LIPITOR) 40 MG tablet TAKE 1 TABLET EVERY DAY 04/07/23   Christen Butter, NP  FLUoxetine (PROZAC) 20 MG capsule TAKE 1 CAPSULE EVERY DAY 03/03/23   Christen Butter, NP  fluticasone (FLONASE) 50 MCG/ACT nasal spray USE 2 SPRAYS IN EACH NOSTRIL EVERY DAY 03/18/23   Christen Butter, NP  meloxicam (MOBIC) 15 MG tablet TAKE 1 TABLET BY MOUTH ONCE DAILY IN THE MORNING WITH MEALS FOR 14 DAYS THEN AS NEEDED FOR PAIN 04/26/22   Monica Becton, MD  Misc. Devices MISC Start CPAP at 13 cm. water pressure with EPR 1.  CPAP machine with a mask and supplies for OSA with AHI 10.  AirFit P10(S) nasal pillow mask or patient preference.  Send to a local DME.  [facility] or first available 01/02/21   [provider]  oxyCODONE-acetaminophen (PERCOCET/ROXICET) 5-325 MG tablet Take 1-2 tablets by mouth every 8 (eight) hours as needed for severe pain. 04/10/23   Eustace Moore, MD  pantoprazole (PROTONIX) 20 MG tablet Take 1 tablet (20 mg total) by mouth daily. 01/01/23   Christen Butter, NP    Family History Family History  Problem Relation Age of Onset   Dementia Mother    Heart disease Father    Lung cancer Father    Heart attack Brother    Heart attack Paternal Grandfather    Miscarriages / [place] Daughter     Social History Social History   Tobacco Use   Smoking status: Never   Smokeless tobacco: Never  Vaping Use   Vaping Use: Never used  Substance Use Topics   Alcohol use: Yes    Alcohol/week: 3.0 standard drinks of alcohol    Types: 3 Cans of beer per week    Comment: weekly   Drug use: Never     Allergies   Codeine and Penicillins   Review of Systems Review of Systems  See HPI Physical Exam Triage Vital Signs ED Triage Vitals  Enc Vitals Group     BP 04/24/23 0822 128/81     Pulse Rate 04/24/23 0822 70     Resp 04/24/23 0822 16     Temp 04/24/23 0822 98.5 F (36.9 C)     Temp Source 04/24/23 0822 Oral     SpO2  04/24/23 0822 98 %     Weight --      Height --      Head Circumference --      Peak Flow --      Pain Score 04/24/23 0823 5     Pain Loc --      Pain Edu? --      Excl. in GC? --    No data found.  Updated Vital Signs BP 128/81 (BP Location: Left Arm)   Pulse 70  Temp 98.5 F (36.9 C) (Oral)   Resp 16   SpO2 98%      Physical Exam Constitutional:      General: He is not in acute distress.    Appearance: He is well-developed.  HENT:     Head: Normocephalic and atraumatic.     Right Ear: Tympanic membrane and ear canal normal.     Left Ear: Tympanic membrane and ear canal normal.     Nose: Nose normal. No congestion.     Mouth/Throat:     Mouth: Mucous membranes are moist.  Eyes:     Conjunctiva/sclera: Conjunctivae normal.     Pupils: Pupils are equal, round, and reactive to light.     Comments: Eyes are clear.  Fundi benign  Neck:     Vascular: No carotid bruit.  Cardiovascular:     Rate and Rhythm: Normal rate and regular rhythm.     Heart sounds: Normal heart sounds.  Pulmonary:     Effort: Pulmonary effort is normal. No respiratory distress.     Breath sounds: Normal breath sounds.  Abdominal:     General: There is no distension.     Palpations: Abdomen is soft.  Musculoskeletal:        General: Normal range of motion.     Cervical back: Normal range of motion and neck supple.     Comments: Scattered healing small eschar from the vesicles palpable on the right parietal  Lymphadenopathy:     Cervical: No cervical adenopathy.  Skin:    General: Skin is warm and dry.  Neurological:     General: No focal deficit present.     Mental Status: He is alert.      UC Treatments / Results  Labs (all labs ordered are listed, but only abnormal results are displayed) Labs Reviewed - No data to display  EKG   Radiology No results found.  Procedures Procedures (including critical care time)  Medications Ordered in UC Medications - No data to  display  Initial Impression / Assessment and Plan / UC Course  I have reviewed the triage vital signs and the nursing notes.  Pertinent labs & imaging results that were available during my care of the patient were reviewed by me and considered in my medical decision making (see chart for details).     Concerned that he is still having scalp pain and headaches in spite of treatment for the review zoster.  Worried about post herpetic neuralgia.  Will try amitriptyline at bedtime.  Recommend follow-up with PCP.  Discussed neurology referral if he fails to improve. 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 Provider   amitriptyline (ELAVIL) 10 MG tablet Take 1 tablet (10 mg total) by mouth at bedtime. If no improvement in a week take 2 at night 30 tablet Eustace Moore, MD   ondansetron (ZOFRAN) 8 MG tablet Take 1 tablet (8 mg total) by mouth every 8 (eight) hours as needed for nausea or vomiting. 20 tablet Eustace Moore, MD  PDMP not reviewed this encounter.   Eustace Moore, MD 04/24/23 [phone]

## 2023-05-12 ENCOUNTER — Inpatient Hospital Stay: Payer: Medicare Other | Admitting: Medical-Surgical

## 2023-05-21 ENCOUNTER — Telehealth: Payer: Self-pay | Admitting: Medical-Surgical

## 2023-05-21 DIAGNOSIS — M1712 Unilateral primary osteoarthritis, left knee: Secondary | ICD-10-CM

## 2023-05-21 MED ORDER — MELOXICAM 15 MG PO TABS
ORAL_TABLET | ORAL | 3 refills | Status: DC
Start: 2023-05-21 — End: 2024-02-18

## 2023-05-21 NOTE — Telephone Encounter (Signed)
Patient called for refills on Meloxicam 15mg  he requesting them to be [facility] Mail Delivery phone number is [phone]

## 2023-05-21 NOTE — Telephone Encounter (Signed)
Refill sent as requested. 

## 2023-05-21 NOTE — Telephone Encounter (Signed)
Message sent via Mychart to patient that prescription has been sent.

## 2023-05-26 ENCOUNTER — Ambulatory Visit (INDEPENDENT_AMBULATORY_CARE_PROVIDER_SITE_OTHER): Payer: Medicare Other | Admitting: Medical-Surgical

## 2023-05-26 ENCOUNTER — Encounter: Payer: Self-pay | Admitting: Medical-Surgical

## 2023-05-26 VITALS — BP 144/71 | HR 57 | Resp 20 | Ht 71.0 in | Wt 247.6 lb

## 2023-05-26 DIAGNOSIS — K219 Gastro-esophageal reflux disease without esophagitis: Secondary | ICD-10-CM

## 2023-05-26 DIAGNOSIS — F3342 Major depressive disorder, recurrent, in full remission: Secondary | ICD-10-CM | POA: Diagnosis not present

## 2023-05-26 DIAGNOSIS — B029 Zoster without complications: Secondary | ICD-10-CM | POA: Diagnosis not present

## 2023-05-26 DIAGNOSIS — I10 Essential (primary) hypertension: Secondary | ICD-10-CM | POA: Diagnosis not present

## 2023-05-26 NOTE — Progress Notes (Signed)
        Established patient visit  History, exam, impression, and plan:  1. Gastroesophageal reflux disease, unspecified whether esophagitis present Pleasant 72 year old male presenting today with a history of GERD that is currently managed by Protonix 20 mg daily.  Does not like that he has to rely on medication daily to be able to control his symptoms.  Is interested in further investigation and measures that may be beneficial.  Referring to GI per patient request. - Ambulatory referral to Gastroenterology  2. Herpes zoster without complication Recent issue with shingles affecting his scalp/face/left eye.  Completed the medication course and has been doing well overall.  Still has a couple of scabbed lesions present but overall has been healing well.  Still has some sensation changes to the affected area but no further itching/pain.  Following up with his eye doctor regarding the vision effects.  3. Essential hypertension with goal blood pressure less than 140/90 Blood pressure is elevated on arrival today.  Has been taking amlodipine 10 mg daily as prescribed, tolerating well without side effects.  Has blood pressure machine at home and monitors it occasionally.  Notes that most of his readings look good overall.  Working on following low-sodium diet and activity as tolerated.  Denies concerning symptoms.  Cardiopulmonary exam benign.  Blood pressure recheck at 144/71 which is slightly higher than recommended.  Recommend continuing amlodipine 10 mg daily and working on increasing activity with a low-sodium diet.  Would like for him to check blood pressures at home with a goal of 130/80 or less and if consistently higher, return for further evaluation and medication management.  4. Recurrent major depressive disorder, in full remission (HCC) Continues to take fluoxetine 20 mg daily, tolerating well without side effects.  Feels the medication is working very well to keep his mood stable and denies  concerns for SI/HI.  Since this is working well and his mood, thought pattern, cognition, and affect are all normal during the appointment, continue fluoxetine as prescribed.   Procedures performed this visit: None.  Return in about 6 months (around 11/25/2023) for chronic disease follow up.  __________________________________ Thayer Ohm, DNP, APRN, FNP-BC Primary Care and Sports Medicine [facility] [place]

## 2023-05-30 ENCOUNTER — Other Ambulatory Visit: Payer: Self-pay | Admitting: Medical-Surgical

## 2023-06-06 DIAGNOSIS — R194 Change in bowel habit: Secondary | ICD-10-CM | POA: Diagnosis not present

## 2023-06-06 DIAGNOSIS — K219 Gastro-esophageal reflux disease without esophagitis: Secondary | ICD-10-CM | POA: Diagnosis not present

## 2023-07-27 ENCOUNTER — Other Ambulatory Visit: Payer: Self-pay | Admitting: Medical-Surgical

## 2023-07-27 DIAGNOSIS — F3342 Major depressive disorder, recurrent, in full remission: Secondary | ICD-10-CM

## 2023-08-18 DIAGNOSIS — Z6834 Body mass index (BMI) 34.0-34.9, adult: Secondary | ICD-10-CM | POA: Diagnosis not present

## 2023-08-18 DIAGNOSIS — G4733 Obstructive sleep apnea (adult) (pediatric): Secondary | ICD-10-CM | POA: Diagnosis not present

## 2023-08-18 DIAGNOSIS — E6609 Other obesity due to excess calories: Secondary | ICD-10-CM | POA: Diagnosis not present

## 2023-09-02 DIAGNOSIS — K2100 Gastro-esophageal reflux disease with esophagitis, without bleeding: Secondary | ICD-10-CM | POA: Diagnosis not present

## 2023-09-02 DIAGNOSIS — K6389 Other specified diseases of intestine: Secondary | ICD-10-CM | POA: Diagnosis not present

## 2023-09-02 DIAGNOSIS — R194 Change in bowel habit: Secondary | ICD-10-CM | POA: Diagnosis not present

## 2023-09-02 DIAGNOSIS — K2950 Unspecified chronic gastritis without bleeding: Secondary | ICD-10-CM | POA: Diagnosis not present

## 2023-09-02 DIAGNOSIS — K3189 Other diseases of stomach and duodenum: Secondary | ICD-10-CM | POA: Diagnosis not present

## 2023-09-02 DIAGNOSIS — K635 Polyp of colon: Secondary | ICD-10-CM | POA: Diagnosis not present

## 2023-09-04 DIAGNOSIS — K449 Diaphragmatic hernia without obstruction or gangrene: Secondary | ICD-10-CM | POA: Diagnosis not present

## 2023-09-04 DIAGNOSIS — K209 Esophagitis, unspecified without bleeding: Secondary | ICD-10-CM | POA: Diagnosis not present

## 2023-09-04 DIAGNOSIS — R194 Change in bowel habit: Secondary | ICD-10-CM | POA: Diagnosis not present

## 2023-09-04 DIAGNOSIS — R12 Heartburn: Secondary | ICD-10-CM | POA: Diagnosis not present

## 2023-10-28 DIAGNOSIS — Z961 Presence of intraocular lens: Secondary | ICD-10-CM | POA: Diagnosis not present

## 2023-10-28 DIAGNOSIS — B0239 Other herpes zoster eye disease: Secondary | ICD-10-CM | POA: Diagnosis not present

## 2023-10-28 DIAGNOSIS — H526 Other disorders of refraction: Secondary | ICD-10-CM | POA: Diagnosis not present

## 2023-10-28 DIAGNOSIS — H02403 Unspecified ptosis of bilateral eyelids: Secondary | ICD-10-CM | POA: Diagnosis not present

## 2023-11-03 DIAGNOSIS — E66811 Obesity, class 1: Secondary | ICD-10-CM | POA: Diagnosis not present

## 2023-11-03 DIAGNOSIS — G4733 Obstructive sleep apnea (adult) (pediatric): Secondary | ICD-10-CM | POA: Diagnosis not present

## 2023-11-03 DIAGNOSIS — E6609 Other obesity due to excess calories: Secondary | ICD-10-CM | POA: Diagnosis not present

## 2023-11-03 DIAGNOSIS — Z6834 Body mass index (BMI) 34.0-34.9, adult: Secondary | ICD-10-CM | POA: Diagnosis not present

## 2023-11-12 ENCOUNTER — Other Ambulatory Visit: Payer: Self-pay | Admitting: Medical-Surgical

## 2023-11-12 DIAGNOSIS — I10 Essential (primary) hypertension: Secondary | ICD-10-CM

## 2023-11-12 DIAGNOSIS — E785 Hyperlipidemia, unspecified: Secondary | ICD-10-CM

## 2023-11-25 ENCOUNTER — Encounter: Payer: Self-pay | Admitting: Medical-Surgical

## 2023-11-25 ENCOUNTER — Ambulatory Visit (INDEPENDENT_AMBULATORY_CARE_PROVIDER_SITE_OTHER): Payer: Medicare Other | Admitting: Medical-Surgical

## 2023-11-25 VITALS — BP 160/79 | HR 68 | Resp 20 | Ht 71.0 in | Wt 249.3 lb

## 2023-11-25 DIAGNOSIS — I10 Essential (primary) hypertension: Secondary | ICD-10-CM | POA: Diagnosis not present

## 2023-11-25 DIAGNOSIS — E785 Hyperlipidemia, unspecified: Secondary | ICD-10-CM

## 2023-11-25 DIAGNOSIS — N289 Disorder of kidney and ureter, unspecified: Secondary | ICD-10-CM

## 2023-11-25 DIAGNOSIS — I129 Hypertensive chronic kidney disease with stage 1 through stage 4 chronic kidney disease, or unspecified chronic kidney disease: Secondary | ICD-10-CM

## 2023-11-25 DIAGNOSIS — I77811 Abdominal aortic ectasia: Secondary | ICD-10-CM | POA: Diagnosis not present

## 2023-11-25 MED ORDER — VALSARTAN 40 MG PO TABS: 40.0000 mg | ORAL_TABLET | Freq: Every day | ORAL | 0 refills | Status: DC

## 2023-11-25 NOTE — Progress Notes (Signed)
        Established patient visit  History, exam, impression, and plan:  1. Essential hypertension with goal blood pressure less than 140/90 Very pleasant 72 year old male presenting today with a history of essential hypertension that is currently treated with amlodipine 10 mg daily.  Has been monitoring blood home and notes that his systolic readings have been 130s-160s most of the time.  Notes that he has gained weight and is not as physically active as he should be.  Usually follows a low-sodium diet.  Denies any concerning symptoms today.  Blood pressure elevated on arrival and worse on recheck.  Discussed recommendations for management of blood pressure.  He is agreeable so we are going to continue amlodipine 10 mg daily while adding valsartan 40 mg daily.  Plan to check blood pressures at home with a goal of 140/90 or less and return in 2 weeks for nurse visit for blood pressure check.  Checking labs as noted below. - CBC with Differential/Platelet - CMP14+EGFR - Lipid panel  2. Hypertensive chronic kidney disease with stage 1 through stage 4 chronic kidney disease, or unspecified chronic kidney disease 3. Abdominal aortic ectasia (HCC) History of renal insufficiency in the setting of hypertension as well as abdominal aortic ectasia.  His last imaging was completed in 2019 with recommendation to repeat in 5 years to assure stability.  Checking labs to evaluate kidney function but we will also plan on getting an updated ultrasound of the aorta since he is slightly overdue. - US AORTA; Future  4. Dyslipidemia Currently taking atorvastatin 40 mg daily, tolerating well without side effects.  Has room for improvement in his diet and exercise habits.  Plan to check lipid panel today.  Continue atorvastatin but may need to adjust dosing depending on results.  Physical Exam Vitals and nursing note reviewed.  Constitutional:      General: He is not in acute distress.    Appearance: Normal  appearance.  HENT:     Head: Normocephalic and atraumatic.  Cardiovascular:     Rate and Rhythm: Normal rate and regular rhythm.     Pulses: Normal pulses.     Heart sounds: Normal heart sounds. No murmur heard.    No friction rub. No gallop.  Pulmonary:     Effort: Pulmonary effort is normal. No respiratory distress.     Breath sounds: Normal breath sounds.  Skin:    General: Skin is warm and dry.  Neurological:     Mental Status: He is alert and oriented to person, place, and time.  Psychiatric:        Mood and Affect: Mood normal.        Behavior: Behavior normal.        Thought Content: Thought content normal.        Judgment: Judgment normal.     Procedures performed this visit: None.  Return in about 2 weeks (around 12/09/2023) for nurse visit for BP check.  __________________________________ Thayer Ohm, DNP, APRN, FNP-BC Primary Care and Sports Medicine [employer] [place]

## 2023-11-26 LAB — CMP14+EGFR
ALT: 28 [IU]/L (ref 0–44)
AST: 32 [IU]/L (ref 0–40)
Albumin: 4 g/dL (ref 3.9–4.9)
Alkaline Phosphatase: 85 [IU]/L (ref 44–121)
BUN/Creatinine Ratio: 29 — ABNORMAL HIGH (ref 10–24)
BUN: 33 mg/dL — ABNORMAL HIGH (ref 8–27)
Bilirubin Total: 0.6 mg/dL (ref 0.0–1.2)
CO2: 22 mmol/L (ref 20–29)
Calcium: 9.2 mg/dL (ref 8.6–10.2)
Chloride: 104 mmol/L (ref 96–106)
Creatinine, Ser: 1.12 mg/dL (ref 0.76–1.27)
Globulin, Total: 2.8 g/dL (ref 1.5–4.5)
Glucose: 87 mg/dL (ref 70–99)
Potassium: 4.9 mmol/L (ref 3.5–5.2)
Sodium: 140 mmol/L (ref 134–144)
Total Protein: 6.8 g/dL (ref 6.0–8.5)
eGFR: 71 mL/min/{1.73_m2} (ref >59)

## 2023-11-26 LAB — CBC WITH DIFFERENTIAL/PLATELET
Basophils Absolute: 0.1 10*3/uL (ref 0.0–0.2)
Basos: 1 %
EOS (ABSOLUTE): 0.5 10*3/uL — ABNORMAL HIGH (ref 0.0–0.4)
Eos: 9 %
Hematocrit: 41.3 % (ref 37.5–51.0)
Hemoglobin: 13.4 g/dL (ref 13.0–17.7)
Immature Grans (Abs): 0 10*3/uL (ref 0.0–0.1)
Immature Granulocytes: 0 %
Lymphocytes Absolute: 1.8 10*3/uL (ref 0.7–3.1)
Lymphs: 29 %
MCH: 30.9 pg (ref 26.6–33.0)
MCHC: 32.4 g/dL (ref 31.5–35.7)
MCV: 95 fL (ref 79–97)
Monocytes Absolute: 0.7 10*3/uL (ref 0.1–0.9)
Monocytes: 12 %
Neutrophils Absolute: 3.1 10*3/uL (ref 1.4–7.0)
Neutrophils: 49 %
Platelets: 256 10*3/uL (ref 150–450)
RBC: 4.33 x10E6/uL (ref 4.14–5.80)
RDW: 12.7 % (ref 11.6–15.4)
WBC: 6.3 10*3/uL (ref 3.4–10.8)

## 2023-11-26 LAB — LIPID PANEL
Chol/HDL Ratio: 2.5 {ratio} (ref 0.0–5.0)
Cholesterol, Total: 150 mg/dL (ref 100–199)
HDL: 60 mg/dL (ref >39)
LDL Chol Calc (NIH): 80 mg/dL (ref 0–99)
Triglycerides: 42 mg/dL (ref 0–149)
VLDL Cholesterol Cal: 10 mg/dL (ref 5–40)

## 2023-12-09 ENCOUNTER — Ambulatory Visit (INDEPENDENT_AMBULATORY_CARE_PROVIDER_SITE_OTHER): Payer: Medicare Other

## 2023-12-09 VITALS — BP 142/69 | HR 58

## 2023-12-09 DIAGNOSIS — I10 Essential (primary) hypertension: Secondary | ICD-10-CM

## 2023-12-09 NOTE — Progress Notes (Signed)
   Established Patient Office Visit  Subjective   Patient ID: Lance Anderson, male    DOB: 11-27-1951  Age: 72 y.o. MRN: [id number]  Chief Complaint  Patient presents with   Hypertension    HPI  [name] is here for blood pressure check. Denies chest pain, shortness of breath or dizziness.   ROS    Objective:     BP (!) 142/69   Pulse (!) 58   SpO2 97%    Physical Exam   No results found for any visits on 12/09/23.    The 10-year ASCVD risk score (Arnett DK, et al., 2019) is: 19.9%    Assessment & Plan:  Hypertension - Blood pressure is not at goal. Patient advised to take valsartan 80 mg daily and return in 2 weeks for nurse visit blood pressure check. He would like the valsartan 80 mg sent to [facility] on [address]   Problem List Items Addressed This Visit       Unprioritized   Essential hypertension with goal blood pressure less than 140/90 - Primary    Return in about 2 weeks (around 12/23/2023) for nurse visit blood pressure check. Earna Coder, Janalyn Harder, CMA

## 2023-12-15 ENCOUNTER — Ambulatory Visit: Payer: Medicare Other

## 2023-12-15 ENCOUNTER — Other Ambulatory Visit: Payer: Self-pay | Admitting: Medical-Surgical

## 2023-12-15 DIAGNOSIS — I129 Hypertensive chronic kidney disease with stage 1 through stage 4 chronic kidney disease, or unspecified chronic kidney disease: Secondary | ICD-10-CM | POA: Diagnosis not present

## 2023-12-15 DIAGNOSIS — I77811 Abdominal aortic ectasia: Secondary | ICD-10-CM

## 2023-12-15 MED ORDER — VALSARTAN 80 MG PO TABS: 80.0000 mg | ORAL_TABLET | Freq: Every day | ORAL | 0 refills | Status: DC

## 2023-12-23 ENCOUNTER — Encounter: Payer: Self-pay | Admitting: Medical-Surgical

## 2023-12-23 ENCOUNTER — Ambulatory Visit (INDEPENDENT_AMBULATORY_CARE_PROVIDER_SITE_OTHER): Payer: Medicare Other | Admitting: Medical-Surgical

## 2023-12-23 VITALS — BP 145/81 | HR 72 | Ht 71.0 in | Wt 249.3 lb

## 2023-12-23 DIAGNOSIS — I10 Essential (primary) hypertension: Secondary | ICD-10-CM

## 2023-12-23 MED ORDER — VALSARTAN 160 MG PO TABS: 160.0000 mg | ORAL_TABLET | Freq: Every day | ORAL | 0 refills | Status: DC

## 2023-12-23 NOTE — Progress Notes (Signed)
 Medical screening examination/treatment was performed by qualified clinical staff member and as supervising provider I was immediately available for consultation/collaboration. I have reviewed documentation and agree with assessment and plan.  Thayer Ohm, DNP, APRN, FNP-BC [facility] and Sports Medicine

## 2023-12-23 NOTE — Progress Notes (Signed)
 HPI  Patient here for BP check. Elevated at 160/69 at last OV.                      Assessment and Plan:  Pt BP is still slightly elevated, per Zada Palin, NP pt advised to increase his valsartan  to 160mg  and follow up in 2wks for recheck. Pt voiced his understanding.

## 2023-12-30 ENCOUNTER — Other Ambulatory Visit (INDEPENDENT_AMBULATORY_CARE_PROVIDER_SITE_OTHER): Payer: Medicare Other

## 2023-12-30 ENCOUNTER — Ambulatory Visit (INDEPENDENT_AMBULATORY_CARE_PROVIDER_SITE_OTHER): Payer: Medicare Other | Admitting: Sports Medicine

## 2023-12-30 DIAGNOSIS — M1712 Unilateral primary osteoarthritis, left knee: Secondary | ICD-10-CM | POA: Diagnosis not present

## 2023-12-30 MED ORDER — TRIAMCINOLONE ACETONIDE 40 MG/ML IJ SUSP
40.0000 mg | Freq: Once | INTRAMUSCULAR | Status: AC
Start: 2023-12-30 — End: 2023-12-30
  Administered 2023-12-30: 40 mg via INTRAMUSCULAR

## 2023-12-30 NOTE — Code Documentation (Signed)
 done

## 2023-12-30 NOTE — Addendum Note (Signed)
 Addended by: Carren Rang A on: 12/30/2023 04:17 PM   Modules accepted: Orders
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
## 2024-01-05 ENCOUNTER — Ambulatory Visit (INDEPENDENT_AMBULATORY_CARE_PROVIDER_SITE_OTHER): Payer: Medicare Other | Admitting: Medical-Surgical

## 2024-01-05 ENCOUNTER — Encounter: Payer: Self-pay | Admitting: Medical-Surgical

## 2024-01-05 VITALS — BP 124/75 | HR 62 | Resp 20 | Ht 71.0 in | Wt 247.9 lb

## 2024-01-05 DIAGNOSIS — I10 Essential (primary) hypertension: Secondary | ICD-10-CM

## 2024-01-05 DIAGNOSIS — F3342 Major depressive disorder, recurrent, in full remission: Secondary | ICD-10-CM | POA: Diagnosis not present

## 2024-01-05 DIAGNOSIS — Z23 Encounter for immunization: Secondary | ICD-10-CM | POA: Diagnosis not present

## 2024-01-05 DIAGNOSIS — E785 Hyperlipidemia, unspecified: Secondary | ICD-10-CM | POA: Diagnosis not present

## 2024-01-05 NOTE — Progress Notes (Signed)
        Established patient visit  History, exam, impression, and plan:  1. Essential hypertension with goal blood pressure less than 140/90 (Primary) Pleasant 73 year old woman presenting today with a history of hypertension.  He is currently taking amlodipine  and valsartan  as prescribed, tolerating well without side effects.  Blood pressure is at goal today.  Denies any concerning symptoms.  Notes that he does have mild lower extremity edema around the sock line in the evenings but no other issues.  Following a low-sodium diet.  Has joined a gym but has not started going yet.  Cardiopulmonary exam normal today.  Continue amlodipine  10 mg and valsartan  160 mg daily as prescribed.  2. Dyslipidemia Currently taking atorvastatin  40 mg daily, tolerating well without side effects.  Following a low-fat heart healthy diet.  As noted above, has plans to increase his physical activity.  Up-to-date on lipid checks.  Continue atorvastatin  as prescribed.  3. Recurrent major depressive disorder, in full remission (HCC) He is currently taking fluoxetine  20 mg daily and has been on this long-term.  Feels medication continues to work well for him.  Admits to significant stressors with several family members who have mental health and physical illnesses.  Feels that he is doing well overall. Denies SI/HI. Continue Fluoxetine  20mg  daily.   Procedures performed this visit: None.  Return in about 6 months (around 07/04/2024) for mood/HTN/HLD follow up.  __________________________________ Zada FREDRIK Palin, DNP, APRN, FNP-BC Primary Care and Sports Medicine [facility] [place]

## 2024-02-16 ENCOUNTER — Encounter: Payer: Self-pay | Admitting: Medical-Surgical

## 2024-02-16 DIAGNOSIS — M1712 Unilateral primary osteoarthritis, left knee: Secondary | ICD-10-CM

## 2024-02-18 MED ORDER — VALSARTAN 160 MG PO TABS: 160.0000 mg | ORAL_TABLET | Freq: Every day | ORAL | 1 refills | Status: DC

## 2024-02-18 MED ORDER — PANTOPRAZOLE SODIUM 20 MG PO TBEC: 20.0000 mg | DELAYED_RELEASE_TABLET | Freq: Every day | ORAL | 3 refills | Status: DC

## 2024-02-18 MED ORDER — MELOXICAM 15 MG PO TABS: ORAL_TABLET | ORAL | 3 refills | Status: DC

## 2024-02-19 ENCOUNTER — Other Ambulatory Visit: Payer: Self-pay

## 2024-02-19 NOTE — Telephone Encounter (Signed)
 Can you send in a new prescription for 40 MG instead of 20MG ?  I looked back in the last office note and didn't see documentation of the increase.

## 2024-02-19 NOTE — Telephone Encounter (Signed)
 Copied from CRM [id number]. Topic: Clinical - Prescription Issue >> Feb 19, 2024  9:56 AM Eunice Blase wrote: Reason for CRM: Pt called stated that pantoprazole (PROTONIX) 20 MG tablet has been increased to 40 mg, please confirm and send to [facility] - [place], [place] - [address] [place] [address] Phone: [phone] Fax: [phone]. Please call pt at [phone].

## 2024-02-20 NOTE — Telephone Encounter (Signed)
 Called and left a detailed voice mail message on patient home # requesting a return call with this information -

## 2024-02-24 ENCOUNTER — Telehealth: Payer: Self-pay

## 2024-02-24 MED ORDER — PANTOPRAZOLE SODIUM 40 MG PO TBEC: 40.0000 mg | DELAYED_RELEASE_TABLET | Freq: Every day | ORAL | 1 refills | Status: DC

## 2024-02-24 NOTE — Telephone Encounter (Signed)
 Spoke with patient . He states he feels certain that the changed of Protonix to 40mg  was made by Christen Butter, NP  because the 20mg  was not working very well.

## 2024-02-24 NOTE — Telephone Encounter (Signed)
 Copied from CRM [id number]. Topic: Clinical - Prescription Issue >> Feb 24, 2024  3:47 PM Elle L wrote: Reason for CRM: The patient states he was speaking to NP Christen Butter regarding his Pantoprazole (PROTONIX) 20 MG tablet prescription. The patient states that Dr. Ellan Lambert is who had prescribed the 40 MG and he is requesting a new prescription from NP Christen Butter for the 40 MG prescription.

## 2024-02-24 NOTE — Telephone Encounter (Signed)
 This is a response to a previous message.  It was received after I spoke with patient. He must have found who the prescriber of the 40mg  was  ( he had thought it was Joy  but by his note states it was DR Ellan Lambert)  Still requesting refill from Christen Butter, NP.

## 2024-02-25 NOTE — Telephone Encounter (Signed)
 Prescription for 40 MG was sent to the pharmacy 02/24/2024

## 2024-02-27 ENCOUNTER — Ambulatory Visit (INDEPENDENT_AMBULATORY_CARE_PROVIDER_SITE_OTHER): Admitting: Sports Medicine

## 2024-02-27 ENCOUNTER — Telehealth: Payer: Self-pay | Admitting: Sports Medicine

## 2024-02-27 ENCOUNTER — Ambulatory Visit

## 2024-02-27 ENCOUNTER — Encounter: Payer: Self-pay | Admitting: Sports Medicine

## 2024-02-27 DIAGNOSIS — M170 Bilateral primary osteoarthritis of knee: Secondary | ICD-10-CM

## 2024-02-27 DIAGNOSIS — M25562 Pain in left knee: Secondary | ICD-10-CM | POA: Diagnosis not present

## 2024-02-27 DIAGNOSIS — M25462 Effusion, left knee: Secondary | ICD-10-CM | POA: Diagnosis not present

## 2024-02-27 DIAGNOSIS — G8929 Other chronic pain: Secondary | ICD-10-CM | POA: Diagnosis not present

## 2024-02-27 DIAGNOSIS — M1712 Unilateral primary osteoarthritis, left knee: Secondary | ICD-10-CM

## 2024-02-27 DIAGNOSIS — M25561 Pain in right knee: Secondary | ICD-10-CM | POA: Diagnosis not present

## 2024-02-27 DIAGNOSIS — M1711 Unilateral primary osteoarthritis, right knee: Secondary | ICD-10-CM | POA: Diagnosis not present
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
## 2024-02-27 NOTE — Telephone Encounter (Signed)
 Left knee Orthovisc approval, failed everything

## 2024-03-02 ENCOUNTER — Ambulatory Visit (INDEPENDENT_AMBULATORY_CARE_PROVIDER_SITE_OTHER)

## 2024-03-02 DIAGNOSIS — M1712 Unilateral primary osteoarthritis, left knee: Secondary | ICD-10-CM

## 2024-03-02 DIAGNOSIS — M25462 Effusion, left knee: Secondary | ICD-10-CM | POA: Diagnosis not present

## 2024-03-02 DIAGNOSIS — M25562 Pain in left knee: Secondary | ICD-10-CM | POA: Diagnosis not present

## 2024-03-03 NOTE — Telephone Encounter (Addendum)
 Benefits Investigation Details received from [employer] (Medicare) Injection: Orthovisc PA required: No May fill through: Buy and Bill (preferred) OOV Copay/Coinsurance: 20% Product Copay: 20% Administration Coinsurance: 20% Administration Copay: 20$ Deductible: $ 257(Met)  Benefits Investigation Details received from [employer] (BCBS Supplementation) Injection: Orthovisc PA required: No May fill through: Buy and Bill (preferred) OOV Copay/Coinsurance: 0% Product Copay: 0% Administration Coinsurance: 0% Administration Copay: 0$ Deductible:   Benefit investigation started 03/04/23  No Prior Auth Required  Medical deductible applies , since the deductible has been met, patient is responsible for coinsurance Secondary: The secondary plan follows  medicare guidelines , it will pick up the remaining eligible expenses at 100%, it does not cover Medicare B deductible   Pharmacy: The product  is not covered  under the pharmacy plan   *Pt called , and notified of $0 co-pay , front desk notified to call pt and schedule and injections.

## 2024-03-16 ENCOUNTER — Other Ambulatory Visit (INDEPENDENT_AMBULATORY_CARE_PROVIDER_SITE_OTHER): Payer: Self-pay

## 2024-03-16 ENCOUNTER — Encounter: Payer: Self-pay | Admitting: Sports Medicine

## 2024-03-16 ENCOUNTER — Ambulatory Visit (INDEPENDENT_AMBULATORY_CARE_PROVIDER_SITE_OTHER): Admitting: Sports Medicine

## 2024-03-16 DIAGNOSIS — M1712 Unilateral primary osteoarthritis, left knee: Secondary | ICD-10-CM

## 2024-03-16 MED ORDER — HYALURONAN 30 MG/2ML IX SOSY
30.0000 mg | PREFILLED_SYRINGE | Freq: Once | INTRA_ARTICULAR | Status: AC
Start: 2024-03-16 — End: 2024-03-16
  Administered 2024-03-16: 30 mg via INTRA_ARTICULAR

## 2024-03-16 NOTE — Progress Notes (Signed)
    Procedures performed today:    Procedure: Real-time Ultrasound Guided aspiration/injection of left knee Device: Samsung HS60  Verbal informed consent obtained.  Time-out conducted.  Noted no overlying erythema, induration, or other signs of local infection.  Skin prepped in a sterile fashion.  Local anesthesia: Topical Ethyl chloride.  With sterile technique and under real time ultrasound guidance: Using an 18-gauge needle aspirated 23 mL of clear, straw-colored fluid, syringe switched and 30 mg/2 mL of OrthoVisc (sodium hyaluronate) in a prefilled syringe was injected easily into the knee. Completed without difficulty  Advised to call if fevers/chills, erythema, induration, drainage, or persistent bleeding.  Images permanently stored and available for review in PACS.  Impression: Technically successful ultrasound guided aspiration/injection.  Independent interpretation of notes and tests performed by another provider:   None.  Brief History, Exam, Impression, and Recommendations:    Primary osteoarthritis of left knee Please see prior notes, the patient has had a lot done on his knee in the past. Today we did Orthovisc No. 1 of 4, return in 1 week for #2 of 4.    ____________________________________________ Ihor Austin. Benjamin Stain, M.D., ABFM., CAQSM., AME. Primary Care and Sports Medicine [place] [facility]  Adjunct Professor of Family Medicine  [employer]  Restaurant manager, fast food

## 2024-03-16 NOTE — Assessment & Plan Note (Signed)
 Please see prior notes, the patient has had a lot done on his knee in the past. Today we did Orthovisc No. 1 of 4, return in 1 week for #2 of 4.

## 2024-03-23 ENCOUNTER — Ambulatory Visit (INDEPENDENT_AMBULATORY_CARE_PROVIDER_SITE_OTHER): Admitting: Sports Medicine

## 2024-03-23 ENCOUNTER — Encounter: Payer: Self-pay | Admitting: Sports Medicine

## 2024-03-23 ENCOUNTER — Other Ambulatory Visit (INDEPENDENT_AMBULATORY_CARE_PROVIDER_SITE_OTHER): Payer: Self-pay

## 2024-03-23 DIAGNOSIS — M1712 Unilateral primary osteoarthritis, left knee: Secondary | ICD-10-CM

## 2024-03-23 MED ORDER — HYALURONAN 30 MG/2ML IX SOSY
15.0000 mg | PREFILLED_SYRINGE | Freq: Once | INTRA_ARTICULAR | Status: AC
Start: 2024-03-23 — End: 2024-03-23
  Administered 2024-03-23: 15 mg via INTRA_ARTICULAR

## 2024-03-23 NOTE — Assessment & Plan Note (Signed)
 Orthovisc 2 of 4 left knee, return in 1 week for #3

## 2024-03-23 NOTE — Addendum Note (Signed)
 Addended by: Samule Dry on: 03/23/2024 10:04 AM   Modules accepted: Orders

## 2024-03-23 NOTE — Progress Notes (Signed)
    Procedures performed today:    Procedure: Real-time Ultrasound Guided aspiration/injection of left knee Device: Samsung HS60  Verbal informed consent obtained.  Time-out conducted.  Noted no overlying erythema, induration, or other signs of local infection.  Skin prepped in a sterile fashion.  Local anesthesia: Topical Ethyl chloride.  With sterile technique and under real time ultrasound guidance: Using an 18-gauge needle aspirated 15 mL of clear, straw-colored fluid, syringe switched and 30 mg/2 mL of OrthoVisc (sodium hyaluronate) in a prefilled syringe was injected easily into the knee. Completed without difficulty  Advised to call if fevers/chills, erythema, induration, drainage, or persistent bleeding.  Images permanently stored and available for review in PACS.  Impression: Technically successful ultrasound guided aspiration/injection.  Independent interpretation of notes and tests performed by another provider:   None.  Brief History, Exam, Impression, and Recommendations:    Primary osteoarthritis of left knee Orthovisc 2 of 4 left knee, return in 1 week for #3    ____________________________________________ Ihor Austin. Benjamin Stain, M.D., ABFM., CAQSM., AME. Primary Care and Sports Medicine [place] [facility]  Adjunct Professor of Family Medicine  [employer]  Restaurant manager, fast food

## 2024-03-30 ENCOUNTER — Encounter: Payer: Self-pay | Admitting: Sports Medicine

## 2024-03-30 ENCOUNTER — Other Ambulatory Visit (INDEPENDENT_AMBULATORY_CARE_PROVIDER_SITE_OTHER)

## 2024-03-30 ENCOUNTER — Ambulatory Visit (INDEPENDENT_AMBULATORY_CARE_PROVIDER_SITE_OTHER): Admitting: Sports Medicine

## 2024-03-30 DIAGNOSIS — M1712 Unilateral primary osteoarthritis, left knee: Secondary | ICD-10-CM

## 2024-03-30 MED ORDER — HYALURONAN 30 MG/2ML IX SOSY
30.0000 mg | PREFILLED_SYRINGE | Freq: Once | INTRA_ARTICULAR | Status: DC
Start: 2024-03-30 — End: 2024-03-30

## 2024-03-30 MED ORDER — HYALURONAN 30 MG/2ML IX SOSY
15.0000 mg | PREFILLED_SYRINGE | Freq: Once | INTRA_ARTICULAR | Status: AC
Start: 2024-03-30 — End: 2024-03-30
  Administered 2024-03-30: 15 mg via INTRA_ARTICULAR

## 2024-03-30 NOTE — Addendum Note (Signed)
 Addended by: Samule Dry on: 03/30/2024 09:27 AM   Modules accepted: Orders

## 2024-03-30 NOTE — Assessment & Plan Note (Signed)
Orthovisc 3 of 4 left knee, return in 1 week for #4 of 4.

## 2024-03-30 NOTE — Progress Notes (Signed)
    Procedures performed today:    Procedure: Real-time Ultrasound Guided injection of left knee Device: Samsung HS60  Verbal informed consent obtained.  Time-out conducted.  Noted no overlying erythema, induration, or other signs of local infection.  Skin prepped in a sterile fashion.  Local anesthesia: Topical Ethyl chloride.  With sterile technique and under real time ultrasound guidance: Mild effusion noted, 30 mg/2 mL of OrthoVisc (sodium hyaluronate) in a prefilled syringe was injected easily into the knee. Completed without difficulty  Advised to call if fevers/chills, erythema, induration, drainage, or persistent bleeding.  Images permanently stored and available for review in PACS.  Impression: Technically successful ultrasound guided injection.  Independent interpretation of notes and tests performed by another provider:   None.  Brief History, Exam, Impression, and Recommendations:    Primary osteoarthritis of left knee Orthovisc 3 of 4 left knee, return in 1 week for #4 of 4.    ____________________________________________ Ihor Austin. Benjamin Stain, M.D., ABFM., CAQSM., AME. Primary Care and Sports Medicine [place] [facility]  Adjunct Professor of Family Medicine  [employer] [facility] [employer] [employer] [employer]  Restaurant manager, fast food

## 2024-04-06 ENCOUNTER — Ambulatory Visit (INDEPENDENT_AMBULATORY_CARE_PROVIDER_SITE_OTHER): Admitting: Sports Medicine

## 2024-04-06 ENCOUNTER — Other Ambulatory Visit (INDEPENDENT_AMBULATORY_CARE_PROVIDER_SITE_OTHER)

## 2024-04-06 ENCOUNTER — Encounter: Payer: Self-pay | Admitting: Sports Medicine

## 2024-04-06 DIAGNOSIS — M1712 Unilateral primary osteoarthritis, left knee: Secondary | ICD-10-CM

## 2024-04-06 MED ORDER — HYALURONAN 30 MG/2ML IX SOSY
15.0000 mg | PREFILLED_SYRINGE | Freq: Once | INTRA_ARTICULAR | Status: AC
Start: 2024-04-06 — End: 2024-04-06
  Administered 2024-04-06: 15 mg via INTRA_ARTICULAR

## 2024-04-06 NOTE — Progress Notes (Signed)
    Procedures performed today:    Procedure: Real-time Ultrasound Guided injection of left knee Device: Samsung HS60  Verbal informed consent obtained.  Time-out conducted.  Noted no overlying erythema, induration, or other signs of local infection.  Skin prepped in a sterile fashion.  Local anesthesia: Topical Ethyl chloride.  With sterile technique and under real time ultrasound guidance: Mild effusion noted, 30 mg/2 mL of OrthoVisc (sodium hyaluronate) in a prefilled syringe was injected easily into the knee. Completed without difficulty  Advised to call if fevers/chills, erythema, induration, drainage, or persistent bleeding.  Images permanently stored and available for review in PACS.  Impression: Technically successful ultrasound guided injection.  Independent interpretation of notes and tests performed by another provider:   None.  Brief History, Exam, Impression, and Recommendations:    Primary osteoarthritis of left knee Orthovisc 4 OF 4 left knee    ____________________________________________ Joselyn Nicely. Sandy Crumb, M.D., ABFM., CAQSM., AME. Primary Care and Sports Medicine [place] [facility]  Adjunct Professor of [facility] Medicine  [employer]  Restaurant manager, fast food

## 2024-04-06 NOTE — Addendum Note (Signed)
 Addended by: OLIVA-AVELLANEDA, Otie Headlee L on: 04/06/2024 09:19 AM   Modules accepted: Orders

## 2024-04-06 NOTE — Assessment & Plan Note (Signed)
 Orthovisc 4 OF 4 left knee

## 2024-05-04 DIAGNOSIS — G4733 Obstructive sleep apnea (adult) (pediatric): Secondary | ICD-10-CM | POA: Diagnosis not present

## 2024-05-04 DIAGNOSIS — E66811 Obesity, class 1: Secondary | ICD-10-CM | POA: Diagnosis not present

## 2024-05-04 DIAGNOSIS — Z6834 Body mass index (BMI) 34.0-34.9, adult: Secondary | ICD-10-CM | POA: Diagnosis not present

## 2024-05-04 DIAGNOSIS — E6609 Other obesity due to excess calories: Secondary | ICD-10-CM | POA: Diagnosis not present

## 2024-05-18 ENCOUNTER — Ambulatory Visit (INDEPENDENT_AMBULATORY_CARE_PROVIDER_SITE_OTHER): Admitting: Sports Medicine

## 2024-05-18 DIAGNOSIS — M1712 Unilateral primary osteoarthritis, left knee: Secondary | ICD-10-CM

## 2024-05-18 NOTE — Assessment & Plan Note (Signed)
 Very pleasant 73 year old male, he is now approximately 6-week status post Orthovisc left knee about 80% better, at this point and limited, he does have some discomfort with doing steps, we will add a reaction knee brace today, he will continue conditioning and return to see me as needed.

## 2024-05-18 NOTE — Progress Notes (Signed)
    Procedures performed today:    None.  Independent interpretation of notes and tests performed by another provider:   None.  Brief History, Exam, Impression, and Recommendations:    Primary osteoarthritis of left knee Very pleasant 73 year old male, he is now approximately 6-week status post Orthovisc left knee about 80% better, at this point and limited, he does have some discomfort with doing steps, we will add a reaction knee brace today, he will continue conditioning and return to see me as needed.    ____________________________________________ Joselyn Nicely. Sandy Crumb, M.D., ABFM., CAQSM., AME. Primary Care and Sports Medicine [facility]  Adjunct Professor of [facility] Medicine  [employer]  Restaurant manager, fast food

## 2024-05-24 ENCOUNTER — Other Ambulatory Visit: Payer: Self-pay

## 2024-05-24 DIAGNOSIS — F3342 Major depressive disorder, recurrent, in full remission: Secondary | ICD-10-CM

## 2024-05-24 DIAGNOSIS — E785 Hyperlipidemia, unspecified: Secondary | ICD-10-CM

## 2024-05-24 DIAGNOSIS — I10 Essential (primary) hypertension: Secondary | ICD-10-CM

## 2024-05-24 MED ORDER — FLUOXETINE HCL 20 MG PO CAPS
20.0000 mg | ORAL_CAPSULE | Freq: Every day | ORAL | 0 refills | Status: DC
Start: 2024-05-24 — End: 2024-07-26

## 2024-05-24 MED ORDER — AMLODIPINE BESYLATE 10 MG PO TABS
10.0000 mg | ORAL_TABLET | Freq: Every day | ORAL | 0 refills | Status: DC
Start: 2024-05-24 — End: 2024-07-26

## 2024-05-24 MED ORDER — ATORVASTATIN CALCIUM 40 MG PO TABS
40.0000 mg | ORAL_TABLET | Freq: Every day | ORAL | 0 refills | Status: DC
Start: 2024-05-24 — End: 2024-07-26

## 2024-05-26 ENCOUNTER — Other Ambulatory Visit: Payer: Self-pay

## 2024-05-26 MED ORDER — PANTOPRAZOLE SODIUM 40 MG PO TBEC: 40.0000 mg | DELAYED_RELEASE_TABLET | Freq: Every day | ORAL | 1 refills | Status: DC

## 2024-07-26 ENCOUNTER — Ambulatory Visit (INDEPENDENT_AMBULATORY_CARE_PROVIDER_SITE_OTHER): Payer: Medicare Other | Admitting: Medical-Surgical

## 2024-07-26 ENCOUNTER — Encounter: Payer: Self-pay | Admitting: Medical-Surgical

## 2024-07-26 VITALS — BP 129/74 | HR 61 | Resp 20 | Ht 71.0 in | Wt 248.0 lb

## 2024-07-26 DIAGNOSIS — E785 Hyperlipidemia, unspecified: Secondary | ICD-10-CM | POA: Diagnosis not present

## 2024-07-26 DIAGNOSIS — I10 Essential (primary) hypertension: Secondary | ICD-10-CM | POA: Diagnosis not present

## 2024-07-26 DIAGNOSIS — F3342 Major depressive disorder, recurrent, in full remission: Secondary | ICD-10-CM

## 2024-07-26 MED ORDER — AMLODIPINE BESYLATE 10 MG PO TABS
10.0000 mg | ORAL_TABLET | Freq: Every day | ORAL | 3 refills | Status: AC
Start: 2024-07-26 — End: ?

## 2024-07-26 MED ORDER — ATORVASTATIN CALCIUM 40 MG PO TABS: 40.0000 mg | ORAL_TABLET | Freq: Every day | ORAL | 3 refills | Status: AC

## 2024-07-26 MED ORDER — FLUOXETINE HCL 20 MG PO CAPS: 20.0000 mg | ORAL_CAPSULE | Freq: Every day | ORAL | 3 refills | Status: AC

## 2024-07-26 MED ORDER — PANTOPRAZOLE SODIUM 40 MG PO TBEC: 40.0000 mg | DELAYED_RELEASE_TABLET | Freq: Every day | ORAL | 3 refills | Status: AC

## 2024-07-26 MED ORDER — VALSARTAN 160 MG PO TABS: 160.0000 mg | ORAL_TABLET | Freq: Every day | ORAL | 3 refills | Status: DC

## 2024-07-26 NOTE — Progress Notes (Signed)
        Established patient visit   History of Present Illness   Discussed the use of AI scribe software for clinical note transcription with the patient, who gave verbal consent to proceed.  History of Present Illness   Lance Anderson is a 73 year old male with hypertension, hyperlipidemia, and depression who presents for follow-up on his mood, blood pressure, and cholesterol management.  He is on valsartan  and amlodipine  for hypertension, Lipitor for hyperlipidemia, and fluoxetine  for mood stabilization. He takes these medications daily without side effects. He does not monitor his blood pressure at home. He denies chest pain or shortness of breath, except transiently at high elevation. Mild lower extremity edema is noted, especially at day's end. He denies headaches, vision changes, lightheadedness, or dizziness.  His diet is low in added salt, though he is uncertain about the salt content in meals prepared by his wife. He frequently dines out, particularly during a recent trip across [place] states.  He experiences leg and finger cramps, with increased frequency in his fingers over the past year. The cramps cause his fingers to 'get stuck' and require manual assistance to relax, often occurring after exertion and sweating.  His mood is stable with the current medication regimen, and he reports no issues with anxiety or depression. Denies SI/HI.      Physical Exam   Physical Exam Vitals and nursing note reviewed.  Constitutional:      General: He is not in acute distress.    Appearance: Normal appearance.  HENT:     Head: Normocephalic and atraumatic.  Cardiovascular:     Rate and Rhythm: Normal rate and regular rhythm.     Pulses: Normal pulses.     Heart sounds: Normal heart sounds. No murmur heard.    No friction rub. No gallop.  Pulmonary:     Effort: Pulmonary effort is normal. No respiratory distress.     Breath sounds: Normal breath sounds.  Skin:    General: Skin is  warm and dry.  Neurological:     Mental Status: He is alert and oriented to person, place, and time.  Psychiatric:        Mood and Affect: Mood normal.        Behavior: Behavior normal.        Thought Content: Thought content normal.        Judgment: Judgment normal.     Assessment & Plan   Assessment and Plan    Hypertension Blood pressure elevated during visits. No chest pain or dyspnea reported at home. Trace edema in lower extremities, worse at day's end. - Recheck blood pressure before leaving office, reading 129/74 at goal. - Continue valsartan  and amlodipine . - Deferred labs until next visit.  Hyperlipidemia Treated with Lipitor.  - Continue Lipitor. - Deferred labs until next visit.  Gastroesophageal reflux disease GERD managed with Protonix . No new symptoms or concerns. - Continue Protonix .  Major depressive disorder Mood well-managed with current treatment. No issues with anxiety or depression. - Continue fluoxetine .  Hand and leg cramps Reports hand and leg cramps, hand cramps more frequent. Symptoms present for about a year. Possible trigger finger or muscle cramping considered. - Recommend trial of magnesium oxide or magnesium complex.      Follow up   Return in about 6 months (around 01/26/2025) for chronic disease follow up.  __________________________________ Zada FREDRIK Palin, DNP, APRN, FNP-BC Primary Care and Sports Medicine [facility] [place]

## 2024-08-24 ENCOUNTER — Encounter: Payer: Self-pay | Admitting: Sports Medicine

## 2024-11-04 DIAGNOSIS — G4733 Obstructive sleep apnea (adult) (pediatric): Secondary | ICD-10-CM | POA: Diagnosis not present

## 2024-11-04 DIAGNOSIS — Z6835 Body mass index (BMI) 35.0-35.9, adult: Secondary | ICD-10-CM | POA: Diagnosis not present

## 2024-11-04 DIAGNOSIS — E66812 Obesity, class 2: Secondary | ICD-10-CM | POA: Diagnosis not present

## 2024-12-02 DIAGNOSIS — Z961 Presence of intraocular lens: Secondary | ICD-10-CM | POA: Diagnosis not present

## 2024-12-02 DIAGNOSIS — H527 Unspecified disorder of refraction: Secondary | ICD-10-CM | POA: Diagnosis not present

## 2024-12-02 DIAGNOSIS — H02403 Unspecified ptosis of bilateral eyelids: Secondary | ICD-10-CM | POA: Diagnosis not present

## 2024-12-02 DIAGNOSIS — B0239 Other herpes zoster eye disease: Secondary | ICD-10-CM | POA: Diagnosis not present

## 2024-12-29 ENCOUNTER — Encounter: Payer: Self-pay | Admitting: Medical-Surgical

## 2024-12-29 ENCOUNTER — Ambulatory Visit (INDEPENDENT_AMBULATORY_CARE_PROVIDER_SITE_OTHER): Admitting: Medical-Surgical

## 2024-12-29 VITALS — BP 158/80 | HR 51 | Temp 97.8°F | Resp 20 | Ht 71.0 in | Wt 255.1 lb

## 2024-12-29 DIAGNOSIS — I10 Essential (primary) hypertension: Secondary | ICD-10-CM

## 2024-12-29 DIAGNOSIS — G514 Facial myokymia: Secondary | ICD-10-CM | POA: Insufficient documentation

## 2024-12-29 DIAGNOSIS — E66812 Obesity, class 2: Secondary | ICD-10-CM

## 2024-12-29 DIAGNOSIS — G8929 Other chronic pain: Secondary | ICD-10-CM | POA: Diagnosis not present

## 2024-12-29 DIAGNOSIS — M1712 Unilateral primary osteoarthritis, left knee: Secondary | ICD-10-CM | POA: Diagnosis not present

## 2024-12-29 DIAGNOSIS — M25511 Pain in right shoulder: Secondary | ICD-10-CM

## 2024-12-29 DIAGNOSIS — R600 Localized edema: Secondary | ICD-10-CM | POA: Insufficient documentation

## 2024-12-29 DIAGNOSIS — R011 Cardiac murmur, unspecified: Secondary | ICD-10-CM | POA: Diagnosis not present

## 2024-12-29 DIAGNOSIS — M545 Low back pain, unspecified: Secondary | ICD-10-CM | POA: Diagnosis not present

## 2024-12-29 DIAGNOSIS — G4733 Obstructive sleep apnea (adult) (pediatric): Secondary | ICD-10-CM | POA: Insufficient documentation

## 2024-12-29 MED ORDER — ZEPBOUND 2.5 MG/0.5ML ~~LOC~~ SOAJ: 2.5000 mg | SUBCUTANEOUS | 0 refills | Status: AC

## 2024-12-29 MED ORDER — CELECOXIB 200 MG PO CAPS: 200.0000 mg | ORAL_CAPSULE | Freq: Two times a day (BID) | ORAL | 2 refills | Status: AC | PRN

## 2024-12-29 MED ORDER — VALSARTAN 320 MG PO TABS: 320.0000 mg | ORAL_TABLET | Freq: Every day | ORAL | 1 refills | Status: AC

## 2024-12-29 MED ORDER — ZEPBOUND 2.5 MG/0.5ML ~~LOC~~ SOAJ: 2.5000 mg | SUBCUTANEOUS | 0 refills | Status: DC

## 2024-12-29 NOTE — Progress Notes (Signed)
 "       Established patient visit   History of Present Illness   Discussed the use of AI scribe software for clinical note transcription with the patient, who gave verbal consent to proceed.  History of Present Illness   Lance Anderson is a 74 year old male who presents with worsening eye twitching and joint pain.  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 for approximately two months, worsening - Onset prior to eye exam in December; eye doctor was not concerned - Twitching does not disturb sleep - No associated pain, vision changes, or drainage  Shoulder arthralgia - Chronic bilateral shoulder pain, worse on the right - Right shoulder pain is persistent and more bothersome recently - Left shoulder pain has improved over the past few days - No new injuries or falls - Long-term use of meloxicam , which no longer controls pain  Left knee pain - Left knee pain radiates above and below the joint, not localized to the joint - History of ACL/MCL problems and medial meniscectomy - Past intra-articular injections for pain - No current knee swelling with fluid  Bilateral lower extremity edema - Bilateral leg swelling worsens throughout the day and improves overnight - Does not wear compression socks  Hypertension - Treated with amlodipine  10mg  daily and valsartan  160mg  daily - Recent blood pressures: 140/80 and 137/79  Heart murmur - 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  Constitutional:      General: He is not in acute distress.    Appearance: Normal appearance. He is not ill-appearing.  HENT:     Head: Normocephalic and atraumatic.  Eyes:     Comments: Left eyelid rapidly twitching causing increased blinking, no  tearing, erythema, or discharge.   Cardiovascular:     Rate and Rhythm: Normal rate and regular rhythm.     Pulses: Normal pulses.     Heart sounds: Normal heart sounds. No murmur heard.    No friction rub. No gallop.  Pulmonary:     Effort: Pulmonary effort is normal. No respiratory distress.     Breath sounds: Normal breath sounds.  Skin:    General: Skin is warm and dry.  Neurological:     Mental Status: He is alert and oriented to person, place, and time.  Psychiatric:        Mood and Affect: Mood normal.        Behavior: Behavior normal.        Thought Content: Thought content normal.        Judgment: Judgment normal.    Assessment & Plan   Eyelid myokymia Chronic eyelid twitching for two months without pain or vision changes. Possible muscle spasm or anxiety-related etiology. - Recommend trial of tonic water with quinine 8 ounces twice daily for the next week to see if this is helpful. - If not, consider possible baclofen or clonazepam.  Chronic right shoulder pain Chronic right shoulder pain, worsening over time. Previous use of meloxicam  with uncertain efficacy. - Discontinued meloxicam . - Initiated Celebrex  200 mg BID as needed for pain.  Primary osteoarthritis of left knee Chronic left knee pain with previous MRI showing ACL and MCL abnormalities, and medial meniscus meniscectomy. - Initiated Celebrex  200 mg BID  as needed for pain. - Offered referral to Sports Med or Ortho but will hold off to see how the Celebrex  works, will let me know  Chronic bilateral low back pain Chronic low back pain, slightly worsened recently. - Initiated Celebrex  200 mg BID as needed for pain.  Bilateral lower extremity edema Chronic lower extremity edema, worsening throughout the day. Likely venous insufficiency due to age-related changes in venous valves. - Recommended use of compression socks. - Advised elevating legs at night.  Essential hypertension Blood pressure readings  elevated at recent appointments. Currently on amlodipine  and valsartan . - Rechecked blood pressure-remains elevated at 158/80. - Continue amlodipine  10 mg daily. -Increasing valsartan  to 320 mg daily.  Systolic cardiac murmur Newly identified systolic cardiac murmur. Last echocardiogram in 2017. - Ordered echocardiogram to evaluate cardiac murmur.  Class II obesity/OSA Recent weight gain due to decreased activity. Interest in GLP-1 agonist therapy for weight management. - Submitted prescription for Zepbound  to check insurance coverage.  General health maintenance Due for routine blood work. - Ordered routine blood work.     Follow up   Return in about 6 months (around 06/28/2025) for chronic disease follow up. __________________________________ Zada FREDRIK Palin, DNP, APRN, FNP-BC Primary Care and Sports Medicine [facility] "

## 2024-12-30 ENCOUNTER — Ambulatory Visit: Payer: Self-pay | Admitting: Medical-Surgical

## 2024-12-30 LAB — COMPREHENSIVE METABOLIC PANEL WITH GFR
ALT: 26 IU/L (ref 0–44)
AST: 29 IU/L (ref 0–40)
Albumin: 4 g/dL (ref 3.8–4.8)
Alkaline Phosphatase: 82 IU/L (ref 47–123)
BUN/Creatinine Ratio: 21 (ref 10–24)
BUN: 24 mg/dL (ref 8–27)
Bilirubin Total: 0.6 mg/dL (ref 0.0–1.2)
CO2: 23 mmol/L (ref 20–29)
Calcium: 9 mg/dL (ref 8.6–10.2)
Chloride: 105 mmol/L (ref 96–106)
Creatinine, Ser: 1.15 mg/dL (ref 0.76–1.27)
Globulin, Total: 2.4 g/dL (ref 1.5–4.5)
Glucose: 83 mg/dL (ref 70–99)
Potassium: 4.7 mmol/L (ref 3.5–5.2)
Sodium: 142 mmol/L (ref 134–144)
Total Protein: 6.4 g/dL (ref 6.0–8.5)
eGFR: 68 mL/min/1.73

## 2024-12-30 LAB — LIPID PANEL
Chol/HDL Ratio: 2.6 ratio (ref 0.0–5.0)
Cholesterol, Total: 148 mg/dL (ref 100–199)
HDL: 58 mg/dL
LDL Chol Calc (NIH): 76 mg/dL (ref 0–99)
Triglycerides: 72 mg/dL (ref 0–149)
VLDL Cholesterol Cal: 14 mg/dL (ref 5–40)

## 2024-12-30 LAB — CBC WITH DIFFERENTIAL/PLATELET
Basophils Absolute: 0.1 x10E3/uL (ref 0.0–0.2)
Basos: 1 %
EOS (ABSOLUTE): 0.3 x10E3/uL (ref 0.0–0.4)
Eos: 4 %
Hematocrit: 39.1 % (ref 37.5–51.0)
Hemoglobin: 13 g/dL (ref 13.0–17.7)
Immature Grans (Abs): 0 x10E3/uL (ref 0.0–0.1)
Immature Granulocytes: 0 %
Lymphocytes Absolute: 1.4 x10E3/uL (ref 0.7–3.1)
Lymphs: 20 %
MCH: 31.6 pg (ref 26.6–33.0)
MCHC: 33.2 g/dL (ref 31.5–35.7)
MCV: 95 fL (ref 79–97)
Monocytes Absolute: 0.9 x10E3/uL (ref 0.1–0.9)
Monocytes: 12 %
Neutrophils Absolute: 4.5 x10E3/uL (ref 1.4–7.0)
Neutrophils: 63 %
Platelets: 272 x10E3/uL (ref 150–450)
RBC: 4.11 x10E6/uL — ABNORMAL LOW (ref 4.14–5.80)
RDW: 12.5 % (ref 11.6–15.4)
WBC: 7.2 x10E3/uL (ref 3.4–10.8)

## 2025-01-09 ENCOUNTER — Other Ambulatory Visit: Payer: Self-pay | Admitting: Medical Genetics

## 2025-01-12 ENCOUNTER — Ambulatory Visit (HOSPITAL_BASED_OUTPATIENT_CLINIC_OR_DEPARTMENT_OTHER)
Admission: RE | Admit: 2025-01-12 | Discharge: 2025-01-12 | Disposition: A | Discharge status: Home / Self Care | Source: Ambulatory Visit | Attending: Medical-Surgical | Admitting: Medical-Surgical

## 2025-01-12 DIAGNOSIS — R011 Cardiac murmur, unspecified: Secondary | ICD-10-CM | POA: Insufficient documentation

## 2025-01-12 LAB — ECHOCARDIOGRAM COMPLETE
AR max vel: 2.23 cm2
AV Area VTI: 2.41 cm2
AV Area mean vel: 2.35 cm2
AV Mean grad: 5 mmHg
AV Peak grad: 12.8 mmHg
Ao pk vel: 1.79 m/s
Area-P 1/2: 2.22 cm2
Calc EF: 66.8 %
MV M vel: 3.48 m/s
MV Peak grad: 48.4 mmHg
S' Lateral: 2.3 cm
Single Plane A2C EF: 68.2 %
Single Plane A4C EF: 63.6 %

## 2025-01-14 ENCOUNTER — Ambulatory Visit (INDEPENDENT_AMBULATORY_CARE_PROVIDER_SITE_OTHER)

## 2025-01-14 VITALS — BP 147/66 | HR 61 | Resp 17 | Ht 71.0 in | Wt 255.0 lb

## 2025-01-14 DIAGNOSIS — I10 Essential (primary) hypertension: Secondary | ICD-10-CM | POA: Diagnosis not present

## 2025-01-14 MED ORDER — HYDROCHLOROTHIAZIDE 12.5 MG PO TABS: 12.5000 mg | ORAL_TABLET | Freq: Every day | ORAL | 0 refills | Status: DC

## 2025-01-14 NOTE — Addendum Note (Signed)
 Addended byBETHA WILLO MINI on: 01/14/2025 12:20 PM   Modules accepted: Orders

## 2025-01-14 NOTE — Progress Notes (Signed)
" ° °  Subjective:    Patient ID: Lance Anderson, male    DOB: [birth year], 74 y.o.   MRN: [id number]  HPI  Patient is here for a 2 wk BP check. Patient currently takes amlodipine  10 mg and valsartan  320 mg in which he takes in the evenings and has not taken it today. He denies CP, SOB, heart palpitations, medication issues, or vision changes.  Review of Systems     Objective:   Physical Exam        Assessment & Plan:   Patients first BP reading was 149/72. His second was 147/66. Reported to PCP joy who would like to add hydrochlorothiazide 12.5 mg to patients regimen and recheck it in 2 wks at [place]. "

## 2025-01-26 ENCOUNTER — Ambulatory Visit: Admitting: Medical-Surgical

## 2025-01-28 ENCOUNTER — Other Ambulatory Visit: Payer: Self-pay

## 2025-01-28 ENCOUNTER — Ambulatory Visit (INDEPENDENT_AMBULATORY_CARE_PROVIDER_SITE_OTHER)

## 2025-01-28 ENCOUNTER — Other Ambulatory Visit: Payer: Self-pay | Admitting: Medical-Surgical

## 2025-01-28 VITALS — BP 139/71 | HR 64 | Resp 19 | Ht 71.0 in | Wt 255.0 lb

## 2025-01-28 DIAGNOSIS — I10 Essential (primary) hypertension: Secondary | ICD-10-CM

## 2025-01-28 DIAGNOSIS — M1712 Unilateral primary osteoarthritis, left knee: Secondary | ICD-10-CM

## 2025-01-28 MED ORDER — HYDROCHLOROTHIAZIDE 25 MG PO TABS: 25.0000 mg | ORAL_TABLET | Freq: Every day | ORAL | 3 refills | Status: AC

## 2025-01-28 NOTE — Progress Notes (Signed)
" ° °  Subjective:    Patient ID: Lance Anderson, male    DOB: [birth year], 74 y.o.   MRN: [id number]  HPI  Patient is here for a BP recheck. Patient currently takes amlodipine  10 mg and hydrochlorothiazide  12.5 mg. Denies CP, SOB, heart palpitations, headaches, or vision changes.  Review of Systems     Objective:   Physical Exam        Assessment & Plan:   Initial BP is 148/76. His second is 139/71. Patient prefers a call or MyChart message with any changes. Forwarding note to Joy for review. Will send MyChart message to patient with any changes or updates. "

## 2025-06-28 ENCOUNTER — Ambulatory Visit: Admitting: Medical-Surgical
